# Patient Record
Sex: Female | Born: 1970 | Race: Black or African American | Hispanic: No | Marital: Single | Smoking: Never smoker
Health system: Southern US, Community
[De-identification: ages and names within clinical notes are randomized; demographics above are authoritative.]

## PROBLEM LIST (undated history)

## (undated) DIAGNOSIS — G5600 Carpal tunnel syndrome, unspecified upper limb: Secondary | ICD-10-CM

## (undated) DIAGNOSIS — J45909 Unspecified asthma, uncomplicated: Secondary | ICD-10-CM

## (undated) DIAGNOSIS — G2581 Restless legs syndrome: Secondary | ICD-10-CM

## (undated) DIAGNOSIS — B029 Zoster without complications: Secondary | ICD-10-CM

## (undated) DIAGNOSIS — I10 Essential (primary) hypertension: Secondary | ICD-10-CM

## (undated) DIAGNOSIS — G609 Hereditary and idiopathic neuropathy, unspecified: Secondary | ICD-10-CM

## (undated) DIAGNOSIS — N63 Unspecified lump in unspecified breast: Secondary | ICD-10-CM

## (undated) DIAGNOSIS — Z21 Asymptomatic human immunodeficiency virus [HIV] infection status: Secondary | ICD-10-CM

## (undated) DIAGNOSIS — F411 Generalized anxiety disorder: Secondary | ICD-10-CM

## (undated) DIAGNOSIS — B20 Human immunodeficiency virus [HIV] disease: Secondary | ICD-10-CM

## (undated) HISTORY — PX: STAPEDES SURGERY: SHX789

## (undated) HISTORY — DX: Essential (primary) hypertension: I10

## (undated) HISTORY — DX: Generalized anxiety disorder: F41.1

## (undated) HISTORY — DX: Carpal tunnel syndrome, unspecified upper limb: G56.00

## (undated) HISTORY — DX: Restless legs syndrome: G25.81

## (undated) HISTORY — DX: Zoster without complications: B02.9

## (undated) HISTORY — DX: Hereditary and idiopathic neuropathy, unspecified: G60.9

## (undated) HISTORY — DX: Unspecified lump in unspecified breast: N63.0

## (undated) HISTORY — DX: Unspecified asthma, uncomplicated: J45.909

---

## 1998-06-19 ENCOUNTER — Encounter: Admission: RE | Admit: 1998-06-19 | Discharge: 1998-06-19 | Payer: Self-pay | Admitting: Sports Medicine

## 2001-10-06 ENCOUNTER — Other Ambulatory Visit: Admission: RE | Admit: 2001-10-06 | Discharge: 2001-10-06 | Payer: Self-pay | Admitting: Family Medicine

## 2004-04-18 ENCOUNTER — Emergency Department (HOSPITAL_COMMUNITY): Admission: EM | Admit: 2004-04-18 | Discharge: 2004-04-18 | Payer: Self-pay | Admitting: Emergency Medicine

## 2005-05-10 ENCOUNTER — Emergency Department (HOSPITAL_COMMUNITY): Admission: EM | Admit: 2005-05-10 | Discharge: 2005-05-10 | Payer: Self-pay | Admitting: Emergency Medicine

## 2005-08-01 ENCOUNTER — Encounter: Admission: RE | Admit: 2005-08-01 | Discharge: 2005-10-30 | Payer: Self-pay | Admitting: Specialist

## 2005-09-27 ENCOUNTER — Emergency Department (HOSPITAL_COMMUNITY): Admission: EM | Admit: 2005-09-27 | Discharge: 2005-09-27 | Payer: Self-pay | Admitting: Emergency Medicine

## 2005-11-09 ENCOUNTER — Emergency Department (HOSPITAL_COMMUNITY): Admission: EM | Admit: 2005-11-09 | Discharge: 2005-11-09 | Payer: Self-pay | Admitting: Emergency Medicine

## 2006-01-19 ENCOUNTER — Emergency Department (HOSPITAL_COMMUNITY): Admission: EM | Admit: 2006-01-19 | Discharge: 2006-01-20 | Payer: Self-pay | Admitting: Emergency Medicine

## 2006-01-19 DIAGNOSIS — B20 Human immunodeficiency virus [HIV] disease: Secondary | ICD-10-CM | POA: Insufficient documentation

## 2006-01-28 ENCOUNTER — Encounter (INDEPENDENT_AMBULATORY_CARE_PROVIDER_SITE_OTHER): Payer: Self-pay | Admitting: *Deleted

## 2006-01-28 ENCOUNTER — Ambulatory Visit: Payer: Self-pay | Admitting: Internal Medicine

## 2006-01-28 ENCOUNTER — Encounter: Admission: RE | Admit: 2006-01-28 | Discharge: 2006-01-28 | Payer: Self-pay | Admitting: Internal Medicine

## 2006-01-28 LAB — CONVERTED CEMR LAB
CD4 Count: 530 microliters
CD4 T Cell Abs: 530
HIV 1 RNA Quant: 8690 copies/mL

## 2006-02-11 ENCOUNTER — Ambulatory Visit: Payer: Self-pay | Admitting: Internal Medicine

## 2006-05-05 ENCOUNTER — Encounter (INDEPENDENT_AMBULATORY_CARE_PROVIDER_SITE_OTHER): Payer: Self-pay | Admitting: *Deleted

## 2006-05-05 ENCOUNTER — Ambulatory Visit: Payer: Self-pay | Admitting: Internal Medicine

## 2006-05-05 ENCOUNTER — Encounter: Admission: RE | Admit: 2006-05-05 | Discharge: 2006-05-05 | Payer: Self-pay | Admitting: Internal Medicine

## 2006-05-05 LAB — CONVERTED CEMR LAB
CD4 Count: 310 microliters
HIV 1 RNA Quant: 2130 copies/mL

## 2006-05-20 ENCOUNTER — Ambulatory Visit: Payer: Self-pay | Admitting: Internal Medicine

## 2006-07-08 ENCOUNTER — Encounter: Admission: RE | Admit: 2006-07-08 | Discharge: 2006-07-08 | Payer: Self-pay | Admitting: Internal Medicine

## 2006-07-08 ENCOUNTER — Encounter: Payer: Self-pay | Admitting: Internal Medicine

## 2006-07-08 ENCOUNTER — Ambulatory Visit: Payer: Self-pay | Admitting: Infectious Diseases

## 2006-07-08 ENCOUNTER — Encounter (INDEPENDENT_AMBULATORY_CARE_PROVIDER_SITE_OTHER): Payer: Self-pay | Admitting: *Deleted

## 2006-07-08 LAB — CONVERTED CEMR LAB
ALT: 14 units/L (ref 0–35)
AST: 18 units/L (ref 0–37)
Albumin: 3.8 g/dL (ref 3.5–5.2)
Alkaline Phosphatase: 57 units/L (ref 39–117)
BUN: 14 mg/dL (ref 6–23)
Basophils Absolute: 0 10*3/uL (ref 0.0–0.1)
Basophils Relative: 0 % (ref 0–1)
CD4 Count: 430 microliters
CO2: 23 meq/L (ref 19–32)
Calcium: 10.1 mg/dL (ref 8.4–10.5)
Chloride: 105 meq/L (ref 96–112)
Creatinine, Ser: 0.84 mg/dL (ref 0.40–1.20)
Eosinophils Absolute: 0.1 10*3/uL (ref 0.0–0.7)
Eosinophils Relative: 2 % (ref 0–5)
Glucose, Bld: 100 mg/dL — ABNORMAL HIGH (ref 70–99)
HCT: 36.7 % (ref 36.0–46.0)
HIV 1 RNA Quant: 2990 copies/mL
HIV 1 RNA Quant: 2990 copies/mL — ABNORMAL HIGH (ref <50)
HIV-1 RNA Quant, Log: 3.48 — ABNORMAL HIGH (ref <1.70)
Hemoglobin: 12.1 g/dL (ref 12.0–15.0)
Lymphocytes Relative: 47 % — ABNORMAL HIGH (ref 12–46)
Lymphs Abs: 1.9 10*3/uL (ref 0.7–3.3)
MCHC: 33 g/dL (ref 30.0–36.0)
MCV: 80 fL (ref 78.0–100.0)
Monocytes Absolute: 0.4 10*3/uL (ref 0.2–0.7)
Monocytes Relative: 10 % (ref 3–11)
Neutro Abs: 1.6 10*3/uL — ABNORMAL LOW (ref 1.7–7.7)
Neutrophils Relative %: 41 % — ABNORMAL LOW (ref 43–77)
Platelets: 209 10*3/uL (ref 150–400)
Potassium: 3.7 meq/L (ref 3.5–5.3)
RBC: 4.59 M/uL (ref 3.87–5.11)
RDW: 16.5 % — ABNORMAL HIGH (ref 11.5–14.0)
Sodium: 139 meq/L (ref 135–145)
Total Bilirubin: 0.3 mg/dL (ref 0.3–1.2)
Total Protein: 8.3 g/dL (ref 6.0–8.3)
WBC: 3.9 10*3/uL — ABNORMAL LOW (ref 4.0–10.5)

## 2006-07-22 ENCOUNTER — Ambulatory Visit: Payer: Self-pay | Admitting: Infectious Diseases

## 2006-07-22 DIAGNOSIS — J309 Allergic rhinitis, unspecified: Secondary | ICD-10-CM | POA: Insufficient documentation

## 2006-07-22 DIAGNOSIS — G5600 Carpal tunnel syndrome, unspecified upper limb: Secondary | ICD-10-CM | POA: Insufficient documentation

## 2006-07-22 DIAGNOSIS — J45909 Unspecified asthma, uncomplicated: Secondary | ICD-10-CM

## 2006-07-22 DIAGNOSIS — F411 Generalized anxiety disorder: Secondary | ICD-10-CM

## 2006-07-22 DIAGNOSIS — I10 Essential (primary) hypertension: Secondary | ICD-10-CM | POA: Insufficient documentation

## 2006-07-22 HISTORY — DX: Generalized anxiety disorder: F41.1

## 2006-07-22 HISTORY — DX: Carpal tunnel syndrome, unspecified upper limb: G56.00

## 2006-07-22 HISTORY — DX: Essential (primary) hypertension: I10

## 2006-07-22 HISTORY — DX: Unspecified asthma, uncomplicated: J45.909

## 2006-08-20 ENCOUNTER — Ambulatory Visit: Payer: Self-pay | Admitting: Infectious Diseases

## 2006-08-24 ENCOUNTER — Encounter (INDEPENDENT_AMBULATORY_CARE_PROVIDER_SITE_OTHER): Payer: Self-pay | Admitting: *Deleted

## 2006-08-24 LAB — CONVERTED CEMR LAB

## 2006-09-06 ENCOUNTER — Encounter (INDEPENDENT_AMBULATORY_CARE_PROVIDER_SITE_OTHER): Payer: Self-pay | Admitting: *Deleted

## 2006-09-15 ENCOUNTER — Encounter (INDEPENDENT_AMBULATORY_CARE_PROVIDER_SITE_OTHER): Payer: Self-pay | Admitting: *Deleted

## 2006-10-21 ENCOUNTER — Encounter: Admission: RE | Admit: 2006-10-21 | Discharge: 2006-10-21 | Payer: Self-pay | Admitting: Infectious Diseases

## 2006-10-21 ENCOUNTER — Ambulatory Visit: Payer: Self-pay | Admitting: Infectious Diseases

## 2006-10-21 LAB — CONVERTED CEMR LAB
ALT: 15 units/L (ref 0–35)
AST: 16 units/L (ref 0–37)
Albumin: 3.8 g/dL (ref 3.5–5.2)
Alkaline Phosphatase: 52 units/L (ref 39–117)
BUN: 16 mg/dL (ref 6–23)
Bilirubin Urine: NEGATIVE
CD4 Count: 350 microliters
CO2: 23 meq/L (ref 19–32)
Calcium: 9.6 mg/dL (ref 8.4–10.5)
Chloride: 105 meq/L (ref 96–112)
Cholesterol: 166 mg/dL (ref 0–200)
Creatinine, Ser: 0.8 mg/dL (ref 0.40–1.20)
Glucose, Bld: 87 mg/dL (ref 70–99)
HCT: 39.1 % (ref 36.0–46.0)
HDL: 42 mg/dL (ref >39)
HIV 1 RNA Quant: 1860 copies/mL — ABNORMAL HIGH (ref <50)
HIV-1 RNA Quant, Log: 3.27 — ABNORMAL HIGH (ref <1.70)
Hemoglobin, Urine: NEGATIVE
Hemoglobin: 12.9 g/dL (ref 12.0–15.0)
Ketones, ur: NEGATIVE mg/dL
LDL Cholesterol: 104 mg/dL — ABNORMAL HIGH (ref 0–99)
Leukocytes, UA: NEGATIVE
MCHC: 33 g/dL (ref 30.0–36.0)
MCV: 83 fL (ref 78.0–100.0)
Nitrite: NEGATIVE
Platelets: 161 10*3/uL (ref 150–400)
Potassium: 3.8 meq/L (ref 3.5–5.3)
Protein, ur: NEGATIVE mg/dL
RBC: 4.71 M/uL (ref 3.87–5.11)
RDW: 15.1 % — ABNORMAL HIGH (ref 11.5–14.0)
Sodium: 138 meq/L (ref 135–145)
Specific Gravity, Urine: 1.019 (ref 1.005–1.03)
Total Bilirubin: 0.1 mg/dL — ABNORMAL LOW (ref 0.3–1.2)
Total CHOL/HDL Ratio: 4
Total Protein: 8.6 g/dL — ABNORMAL HIGH (ref 6.0–8.3)
Triglycerides: 101 mg/dL (ref <150)
Urine Glucose: NEGATIVE mg/dL
Urobilinogen, UA: 0.2 (ref 0.0–1.0)
VLDL: 20 mg/dL (ref 0–40)
WBC: 3.5 10*3/uL — ABNORMAL LOW (ref 4.0–10.5)
pH: 7 (ref 5.0–8.0)

## 2006-10-22 ENCOUNTER — Encounter: Payer: Self-pay | Admitting: Infectious Diseases

## 2007-04-02 ENCOUNTER — Ambulatory Visit: Payer: Self-pay | Admitting: Infectious Diseases

## 2007-04-02 ENCOUNTER — Encounter: Admission: RE | Admit: 2007-04-02 | Discharge: 2007-04-02 | Payer: Self-pay | Admitting: Infectious Diseases

## 2007-04-02 LAB — CONVERTED CEMR LAB
ALT: 13 units/L (ref 0–35)
AST: 16 units/L (ref 0–37)
Albumin: 3.6 g/dL (ref 3.5–5.2)
Alkaline Phosphatase: 50 units/L (ref 39–117)
BUN: 14 mg/dL (ref 6–23)
Basophils Absolute: 0 10*3/uL (ref 0.0–0.1)
Basophils Relative: 0 % (ref 0–1)
CO2: 22 meq/L (ref 19–32)
Calcium: 8.5 mg/dL (ref 8.4–10.5)
Chloride: 105 meq/L (ref 96–112)
Creatinine, Ser: 0.79 mg/dL (ref 0.40–1.20)
Eosinophils Absolute: 0.1 10*3/uL (ref 0.0–0.7)
Eosinophils Relative: 2 % (ref 0–5)
Glucose, Bld: 80 mg/dL (ref 70–99)
HCT: 36.8 % (ref 36.0–46.0)
HIV 1 RNA Quant: 1720 copies/mL — ABNORMAL HIGH (ref <50)
HIV-1 RNA Quant, Log: 3.24 — ABNORMAL HIGH (ref <1.70)
Hemoglobin: 12 g/dL (ref 12.0–15.0)
Lymphocytes Relative: 42 % (ref 12–46)
Lymphs Abs: 1.4 10*3/uL (ref 0.7–3.3)
MCHC: 32.6 g/dL (ref 30.0–36.0)
MCV: 83.3 fL (ref 78.0–100.0)
Monocytes Absolute: 0.4 10*3/uL (ref 0.2–0.7)
Monocytes Relative: 13 % — ABNORMAL HIGH (ref 3–11)
Neutro Abs: 1.4 10*3/uL — ABNORMAL LOW (ref 1.7–7.7)
Neutrophils Relative %: 43 % (ref 43–77)
Platelets: 182 10*3/uL (ref 150–400)
Potassium: 3.7 meq/L (ref 3.5–5.3)
RBC: 4.42 M/uL (ref 3.87–5.11)
RDW: 15.4 % — ABNORMAL HIGH (ref 11.5–14.0)
Sodium: 137 meq/L (ref 135–145)
Total Bilirubin: 0.4 mg/dL (ref 0.3–1.2)
Total Protein: 7.8 g/dL (ref 6.0–8.3)
WBC: 3.3 10*3/uL — ABNORMAL LOW (ref 4.0–10.5)

## 2007-05-03 ENCOUNTER — Encounter: Payer: Self-pay | Admitting: Licensed Clinical Social Worker

## 2007-05-03 ENCOUNTER — Ambulatory Visit: Payer: Self-pay | Admitting: Infectious Diseases

## 2007-05-03 DIAGNOSIS — E669 Obesity, unspecified: Secondary | ICD-10-CM

## 2007-06-29 ENCOUNTER — Encounter: Payer: Self-pay | Admitting: Infectious Diseases

## 2007-08-11 ENCOUNTER — Ambulatory Visit: Payer: Self-pay | Admitting: Infectious Diseases

## 2007-08-11 ENCOUNTER — Encounter: Admission: RE | Admit: 2007-08-11 | Discharge: 2007-08-11 | Payer: Self-pay | Admitting: Infectious Diseases

## 2007-08-11 LAB — CONVERTED CEMR LAB
ALT: 10 units/L (ref 0–35)
AST: 14 units/L (ref 0–37)
Albumin: 3.7 g/dL (ref 3.5–5.2)
Alkaline Phosphatase: 55 units/L (ref 39–117)
BUN: 16 mg/dL (ref 6–23)
Basophils Absolute: 0 10*3/uL (ref 0.0–0.1)
Basophils Relative: 0 % (ref 0–1)
Bilirubin Urine: NEGATIVE
CO2: 25 meq/L (ref 19–32)
Calcium: 9 mg/dL (ref 8.4–10.5)
Chloride: 103 meq/L (ref 96–112)
Creatinine, Ser: 0.91 mg/dL (ref 0.40–1.20)
Eosinophils Absolute: 0.1 10*3/uL (ref 0.0–0.7)
Eosinophils Relative: 3 % (ref 0–5)
Glucose, Bld: 94 mg/dL (ref 70–99)
HCT: 35.5 % — ABNORMAL LOW (ref 36.0–46.0)
HIV 1 RNA Quant: 2920 copies/mL — ABNORMAL HIGH (ref <50)
HIV-1 RNA Quant, Log: 3.47 — ABNORMAL HIGH (ref <1.70)
Hemoglobin, Urine: NEGATIVE
Hemoglobin: 11.7 g/dL — ABNORMAL LOW (ref 12.0–15.0)
Leukocytes, UA: NEGATIVE
Lymphocytes Relative: 36 % (ref 12–46)
Lymphs Abs: 1.5 10*3/uL (ref 0.7–4.0)
MCHC: 33 g/dL (ref 30.0–36.0)
MCV: 82.8 fL (ref 78.0–100.0)
Monocytes Absolute: 0.5 10*3/uL (ref 0.1–1.0)
Monocytes Relative: 13 % — ABNORMAL HIGH (ref 3–12)
Neutro Abs: 2 10*3/uL (ref 1.7–7.7)
Neutrophils Relative %: 48 % (ref 43–77)
Nitrite: NEGATIVE
Platelets: 167 10*3/uL (ref 150–400)
Potassium: 3.5 meq/L (ref 3.5–5.3)
Protein, ur: NEGATIVE mg/dL
RBC: 4.29 M/uL (ref 3.87–5.11)
RDW: 14.5 % (ref 11.5–15.5)
Sodium: 138 meq/L (ref 135–145)
Specific Gravity, Urine: 1.033 — ABNORMAL HIGH (ref 1.005–1.03)
Total Bilirubin: 0.3 mg/dL (ref 0.3–1.2)
Total Protein: 7.9 g/dL (ref 6.0–8.3)
Urine Glucose: NEGATIVE mg/dL
Urobilinogen, UA: 0.2 (ref 0.0–1.0)
WBC: 4.1 10*3/uL (ref 4.0–10.5)
pH: 6 (ref 5.0–8.0)

## 2007-08-24 ENCOUNTER — Encounter (INDEPENDENT_AMBULATORY_CARE_PROVIDER_SITE_OTHER): Payer: Self-pay | Admitting: *Deleted

## 2007-08-25 ENCOUNTER — Ambulatory Visit: Payer: Self-pay | Admitting: Infectious Diseases

## 2007-11-16 ENCOUNTER — Encounter: Payer: Self-pay | Admitting: Infectious Diseases

## 2008-02-01 ENCOUNTER — Ambulatory Visit: Payer: Self-pay | Admitting: Infectious Diseases

## 2008-02-01 ENCOUNTER — Encounter: Admission: RE | Admit: 2008-02-01 | Discharge: 2008-02-01 | Payer: Self-pay | Admitting: Infectious Diseases

## 2008-02-01 LAB — CONVERTED CEMR LAB
ALT: 13 units/L (ref 0–35)
AST: 16 units/L (ref 0–37)
Albumin: 3.8 g/dL (ref 3.5–5.2)
Alkaline Phosphatase: 45 units/L (ref 39–117)
BUN: 14 mg/dL (ref 6–23)
Basophils Absolute: 0 10*3/uL (ref 0.0–0.1)
Basophils Relative: 0 % (ref 0–1)
CO2: 24 meq/L (ref 19–32)
Calcium: 9 mg/dL (ref 8.4–10.5)
Chloride: 103 meq/L (ref 96–112)
Creatinine, Ser: 0.86 mg/dL (ref 0.40–1.20)
Eosinophils Absolute: 0.1 10*3/uL (ref 0.0–0.7)
Eosinophils Relative: 2 % (ref 0–5)
Glucose, Bld: 88 mg/dL (ref 70–99)
HCT: 35.2 % — ABNORMAL LOW (ref 36.0–46.0)
HIV 1 RNA Quant: 1950 copies/mL — ABNORMAL HIGH (ref <50)
HIV-1 RNA Quant, Log: 3.29 — ABNORMAL HIGH (ref <1.70)
Hemoglobin: 11.3 g/dL — ABNORMAL LOW (ref 12.0–15.0)
Lymphocytes Relative: 44 % (ref 12–46)
Lymphs Abs: 1.6 10*3/uL (ref 0.7–4.0)
MCHC: 32.1 g/dL (ref 30.0–36.0)
MCV: 81.5 fL (ref 78.0–100.0)
Monocytes Absolute: 0.5 10*3/uL (ref 0.1–1.0)
Monocytes Relative: 14 % — ABNORMAL HIGH (ref 3–12)
Neutro Abs: 1.5 10*3/uL — ABNORMAL LOW (ref 1.7–7.7)
Neutrophils Relative %: 40 % — ABNORMAL LOW (ref 43–77)
Platelets: 202 10*3/uL (ref 150–400)
Potassium: 3.7 meq/L (ref 3.5–5.3)
RBC: 4.32 M/uL (ref 3.87–5.11)
RDW: 15.7 % — ABNORMAL HIGH (ref 11.5–15.5)
Sodium: 138 meq/L (ref 135–145)
Total Bilirubin: 0.3 mg/dL (ref 0.3–1.2)
Total Protein: 8.1 g/dL (ref 6.0–8.3)
WBC: 3.7 10*3/uL — ABNORMAL LOW (ref 4.0–10.5)

## 2008-02-14 ENCOUNTER — Ambulatory Visit: Payer: Self-pay | Admitting: Infectious Diseases

## 2008-06-14 ENCOUNTER — Ambulatory Visit: Payer: Self-pay | Admitting: Infectious Diseases

## 2008-06-14 LAB — CONVERTED CEMR LAB
ALT: 13 units/L (ref 0–35)
AST: 17 units/L (ref 0–37)
Albumin: 3.6 g/dL (ref 3.5–5.2)
Alkaline Phosphatase: 48 units/L (ref 39–117)
BUN: 14 mg/dL (ref 6–23)
Basophils Absolute: 0 10*3/uL (ref 0.0–0.1)
Basophils Relative: 0 % (ref 0–1)
CO2: 22 meq/L (ref 19–32)
Calcium: 8.6 mg/dL (ref 8.4–10.5)
Chloride: 105 meq/L (ref 96–112)
Creatinine, Ser: 0.7 mg/dL (ref 0.40–1.20)
Eosinophils Absolute: 0.1 10*3/uL (ref 0.0–0.7)
Eosinophils Relative: 3 % (ref 0–5)
Glucose, Bld: 119 mg/dL — ABNORMAL HIGH (ref 70–99)
HCT: 36.5 % (ref 36.0–46.0)
HIV 1 RNA Quant: 3040 copies/mL — ABNORMAL HIGH (ref <48)
HIV-1 RNA Quant, Log: 3.48 — ABNORMAL HIGH (ref <1.68)
Hemoglobin: 11.4 g/dL — ABNORMAL LOW (ref 12.0–15.0)
Lymphocytes Relative: 45 % (ref 12–46)
Lymphs Abs: 1.4 10*3/uL (ref 0.7–4.0)
MCHC: 31.2 g/dL (ref 30.0–36.0)
MCV: 82.6 fL (ref 78.0–100.0)
Monocytes Absolute: 0.4 10*3/uL (ref 0.1–1.0)
Monocytes Relative: 14 % — ABNORMAL HIGH (ref 3–12)
Neutro Abs: 1.2 10*3/uL — ABNORMAL LOW (ref 1.7–7.7)
Neutrophils Relative %: 38 % — ABNORMAL LOW (ref 43–77)
Platelets: 202 10*3/uL (ref 150–400)
Potassium: 3.4 meq/L — ABNORMAL LOW (ref 3.5–5.3)
RBC: 4.42 M/uL (ref 3.87–5.11)
RDW: 15.3 % (ref 11.5–15.5)
Sodium: 139 meq/L (ref 135–145)
Total Bilirubin: 0.3 mg/dL (ref 0.3–1.2)
Total Protein: 7.9 g/dL (ref 6.0–8.3)
WBC: 3.1 10*3/uL — ABNORMAL LOW (ref 4.0–10.5)

## 2008-07-03 ENCOUNTER — Ambulatory Visit: Payer: Self-pay | Admitting: Infectious Diseases

## 2008-07-03 ENCOUNTER — Encounter (INDEPENDENT_AMBULATORY_CARE_PROVIDER_SITE_OTHER): Payer: Self-pay | Admitting: *Deleted

## 2008-07-03 DIAGNOSIS — G609 Hereditary and idiopathic neuropathy, unspecified: Secondary | ICD-10-CM

## 2008-07-03 HISTORY — DX: Hereditary and idiopathic neuropathy, unspecified: G60.9

## 2008-11-01 ENCOUNTER — Ambulatory Visit: Payer: Self-pay | Admitting: Infectious Diseases

## 2008-11-01 LAB — CONVERTED CEMR LAB
ALT: 16 units/L (ref 0–35)
AST: 19 units/L (ref 0–37)
Albumin: 3.8 g/dL (ref 3.5–5.2)
Alkaline Phosphatase: 55 units/L (ref 39–117)
BUN: 12 mg/dL (ref 6–23)
CO2: 24 meq/L (ref 19–32)
Calcium: 8.9 mg/dL (ref 8.4–10.5)
Chloride: 103 meq/L (ref 96–112)
Cholesterol: 130 mg/dL (ref 0–200)
Creatinine, Ser: 0.72 mg/dL (ref 0.40–1.20)
GFR calc Af Amer: >60 mL/min (ref >60)
GFR calc non Af Amer: >60 mL/min (ref >60)
Glucose, Bld: 82 mg/dL (ref 70–99)
HCT: 35.8 % — ABNORMAL LOW (ref 36.0–46.0)
HDL: 37 mg/dL — ABNORMAL LOW (ref >39)
HIV 1 RNA Quant: 3270 copies/mL — ABNORMAL HIGH (ref <48)
HIV-1 RNA Quant, Log: 3.51 — ABNORMAL HIGH (ref <1.68)
Hemoglobin: 11.5 g/dL — ABNORMAL LOW (ref 12.0–15.0)
LDL Cholesterol: 58 mg/dL (ref 0–99)
MCHC: 32.1 g/dL (ref 30.0–36.0)
MCV: 78.9 fL (ref 78.0–100.0)
Platelets: 211 10*3/uL (ref 150–400)
Potassium: 3.5 meq/L (ref 3.5–5.3)
RBC: 4.54 M/uL (ref 3.87–5.11)
RDW: 15.8 % — ABNORMAL HIGH (ref 11.5–15.5)
Sodium: 136 meq/L (ref 135–145)
Total Bilirubin: 0.4 mg/dL (ref 0.3–1.2)
Total CHOL/HDL Ratio: 3.5
Total Protein: 8.3 g/dL (ref 6.0–8.3)
Triglycerides: 177 mg/dL — ABNORMAL HIGH (ref <150)
VLDL: 35 mg/dL (ref 0–40)
WBC: 3.4 10*3/uL — ABNORMAL LOW (ref 4.0–10.5)

## 2009-06-20 ENCOUNTER — Ambulatory Visit: Payer: Self-pay | Admitting: Infectious Diseases

## 2009-06-20 LAB — CONVERTED CEMR LAB
ALT: 18 units/L (ref 0–35)
AST: 18 units/L (ref 0–37)
Albumin: 3.5 g/dL (ref 3.5–5.2)
Alkaline Phosphatase: 50 units/L (ref 39–117)
BUN: 13 mg/dL (ref 6–23)
Basophils Absolute: 0 10*3/uL (ref 0.0–0.1)
Basophils Relative: 0 % (ref 0–1)
CO2: 21 meq/L (ref 19–32)
Calcium: 8.8 mg/dL (ref 8.4–10.5)
Chloride: 103 meq/L (ref 96–112)
Creatinine, Ser: 0.66 mg/dL (ref 0.40–1.20)
Eosinophils Absolute: 0.1 10*3/uL (ref 0.0–0.7)
Eosinophils Relative: 2 % (ref 0–5)
Glucose, Bld: 99 mg/dL (ref 70–99)
HCT: 33.5 % — ABNORMAL LOW (ref 36.0–46.0)
HIV 1 RNA Quant: 3270 copies/mL — ABNORMAL HIGH (ref <48)
HIV-1 RNA Quant, Log: 3.51 — ABNORMAL HIGH (ref <1.68)
Hemoglobin: 11 g/dL — ABNORMAL LOW (ref 12.0–15.0)
Lymphocytes Relative: 43 % (ref 12–46)
Lymphs Abs: 1.5 10*3/uL (ref 0.7–4.0)
MCHC: 32.8 g/dL (ref 30.0–36.0)
MCV: 80.7 fL (ref >=78.0)
Monocytes Absolute: 0.5 10*3/uL (ref 0.1–1.0)
Monocytes Relative: 13 % — ABNORMAL HIGH (ref 3–12)
Neutro Abs: 1.5 10*3/uL — ABNORMAL LOW (ref 1.7–7.7)
Neutrophils Relative %: 42 % — ABNORMAL LOW (ref 43–77)
Platelets: 213 10*3/uL (ref 150–400)
Potassium: 3.7 meq/L (ref 3.5–5.3)
RBC: 4.15 M/uL (ref 3.87–5.11)
RDW: 15.1 % (ref 11.5–15.5)
Sodium: 137 meq/L (ref 135–145)
Total Bilirubin: 0.3 mg/dL (ref 0.3–1.2)
Total Protein: 7.7 g/dL (ref 6.0–8.3)
WBC: 3.6 10*3/uL — ABNORMAL LOW (ref 4.0–10.5)

## 2009-09-28 ENCOUNTER — Emergency Department (HOSPITAL_COMMUNITY): Admission: EM | Admit: 2009-09-28 | Discharge: 2009-09-29 | Payer: Self-pay | Admitting: Emergency Medicine

## 2009-10-30 ENCOUNTER — Encounter (INDEPENDENT_AMBULATORY_CARE_PROVIDER_SITE_OTHER): Payer: Self-pay | Admitting: *Deleted

## 2009-10-30 ENCOUNTER — Ambulatory Visit: Payer: Self-pay | Admitting: Infectious Diseases

## 2009-10-30 LAB — CONVERTED CEMR LAB
ALT: 12 units/L (ref 0–35)
AST: 17 units/L (ref 0–37)
Albumin: 3.6 g/dL (ref 3.5–5.2)
Alkaline Phosphatase: 52 units/L (ref 39–117)
BUN: 9 mg/dL (ref 6–23)
Basophils Absolute: 0 10*3/uL (ref 0.0–0.1)
Basophils Relative: 0 % (ref 0–1)
CO2: 23 meq/L (ref 19–32)
Calcium: 8.5 mg/dL (ref 8.4–10.5)
Chloride: 103 meq/L (ref 96–112)
Cholesterol: 138 mg/dL (ref 0–200)
Creatinine, Ser: 0.7 mg/dL (ref 0.40–1.20)
Eosinophils Absolute: 0 10*3/uL (ref 0.0–0.7)
Eosinophils Relative: 2 % (ref 0–5)
Glucose, Bld: 98 mg/dL (ref 70–99)
HCT: 34.4 % — ABNORMAL LOW (ref 36.0–46.0)
HDL: 36 mg/dL — ABNORMAL LOW (ref >39)
HIV 1 RNA Quant: 3440 copies/mL — ABNORMAL HIGH (ref <48)
HIV-1 RNA Quant, Log: 3.54 — ABNORMAL HIGH (ref <1.68)
Hemoglobin: 10.7 g/dL — ABNORMAL LOW (ref 12.0–15.0)
LDL Cholesterol: 83 mg/dL (ref 0–99)
Lymphocytes Relative: 48 % — ABNORMAL HIGH (ref 12–46)
Lymphs Abs: 1.3 10*3/uL (ref 0.7–4.0)
MCHC: 31.1 g/dL (ref 30.0–36.0)
MCV: 81.3 fL (ref 78.0–100.0)
Monocytes Absolute: 0.4 10*3/uL (ref 0.1–1.0)
Monocytes Relative: 15 % — ABNORMAL HIGH (ref 3–12)
Neutro Abs: 1 10*3/uL — ABNORMAL LOW (ref 1.7–7.7)
Neutrophils Relative %: 35 % — ABNORMAL LOW (ref 43–77)
Platelets: 191 10*3/uL (ref 150–400)
Potassium: 3.4 meq/L — ABNORMAL LOW (ref 3.5–5.3)
RBC: 4.23 M/uL (ref 3.87–5.11)
RDW: 15.9 % — ABNORMAL HIGH (ref 11.5–15.5)
Sodium: 138 meq/L (ref 135–145)
Total Bilirubin: 0.5 mg/dL (ref 0.3–1.2)
Total CHOL/HDL Ratio: 3.8
Total Protein: 7.4 g/dL (ref 6.0–8.3)
Triglycerides: 94 mg/dL (ref <150)
VLDL: 19 mg/dL (ref 0–40)
WBC: 2.7 10*3/uL — ABNORMAL LOW (ref 4.0–10.5)

## 2009-11-12 ENCOUNTER — Ambulatory Visit: Payer: Self-pay | Admitting: Infectious Diseases

## 2010-05-13 ENCOUNTER — Telehealth: Payer: Self-pay

## 2010-05-14 ENCOUNTER — Ambulatory Visit: Payer: Self-pay | Admitting: Adult Health

## 2010-05-21 ENCOUNTER — Ambulatory Visit: Payer: Self-pay | Admitting: Infectious Diseases

## 2010-05-21 ENCOUNTER — Encounter: Admission: RE | Admit: 2010-05-21 | Discharge: 2010-05-21 | Payer: Self-pay | Admitting: Internal Medicine

## 2010-05-21 ENCOUNTER — Telehealth (INDEPENDENT_AMBULATORY_CARE_PROVIDER_SITE_OTHER): Payer: Self-pay | Admitting: *Deleted

## 2010-05-21 DIAGNOSIS — B0229 Other postherpetic nervous system involvement: Secondary | ICD-10-CM

## 2010-05-21 LAB — CONVERTED CEMR LAB
HIV 1 RNA Quant: 28700 copies/mL — ABNORMAL HIGH (ref <20)
HIV-1 RNA Quant, Log: 4.46 — ABNORMAL HIGH (ref <1.30)

## 2010-05-28 DIAGNOSIS — B029 Zoster without complications: Secondary | ICD-10-CM

## 2010-05-28 HISTORY — DX: Zoster without complications: B02.9

## 2010-05-29 ENCOUNTER — Telehealth: Payer: Self-pay | Admitting: Infectious Diseases

## 2010-06-05 ENCOUNTER — Ambulatory Visit: Payer: Self-pay | Admitting: Infectious Diseases

## 2010-07-09 ENCOUNTER — Ambulatory Visit: Admit: 2010-07-09 | Payer: Self-pay | Admitting: Infectious Diseases

## 2010-07-30 NOTE — Progress Notes (Signed)
Summary: Lyrica PAP program documents completed and mailed  Phone Note Outgoing Call   Call placed by: Jennet Maduro RN,  May 21, 2010 5:06 PM Call placed to: [employer] to [facility]: PAP program for Lyrica.  Paperwork completed and mailed with original rx.  Jennet Maduro RN  May 21, 2010 5:07 PM

## 2010-07-30 NOTE — Miscellaneous (Signed)
Summary: Orders Update  Clinical Lists Changes  Orders: Added new Test order of T-CBC w/Diff [phone]) - Signed Added new Test order of T-Comprehensive Metabolic Panel [id number]) - Signed Added new Test order of T-HIV Viral Load [id number]) - Signed Added new Test order of T-Lipid Profile ([id number]) - Signed Added new Test order of T-CD4SP [facility]) (CD4SP) - Signed

## 2010-07-30 NOTE — Assessment & Plan Note (Signed)
Summary: upper chest wall pain   CC:  Intermittent pain upper left chest "a catch" in this area, started 05/18/10 during the day, and Pain has gottne worse since that time.  Has been taking the pain pills.  History of Present Illness: 40 yo F with HIV+ not on art, HTN and anxiety. Last CD4 270, VL 3440 (10-30-09). See nrecently by NP for chest pain. was dx with shingles and was started on tramadol. Now with continued pain in her L upper chest. worse with deep breath. can't cough. had CXR this AM (nl). had some relief with tramadol but makes her sleep.   Preventive Screening-Counseling & Management  Alcohol-Tobacco     Alcohol drinks/day: 0     Smoking Status: never  Caffeine-Diet-Exercise     Caffeine use/day: tea 6 per day     Does Patient Exercise: no  Hep-HIV-STD-Contraception     HIV Risk: no risk noted     HIV Risk Counseling: not indicated-no HIV risk noted  Safety-Violence-Falls     Seat Belt Use: 100   Updated Prior Medication List: MAXZIDE-25 37.5-25 MG TABS (TRIAMTERENE-HCTZ) one by mouth once daily ALBUTEROL 90 MCG/ACT AERS (ALBUTEROL) 1-2 puffs every 4 hours as needed SINGULAIR 10 MG TABS (MONTELUKAST SODIUM) one by mouth once daily MULTIVITAMINS  CAPS (MULTIPLE VITAMIN) one by mouth daily ALPRAZOLAM 0.5 MG  TABS (ALPRAZOLAM) prn LYRICA 50 MG CAPS (PREGABALIN) Take 1 tablet by mouth three times a day ZYRTEC HIVES RELIEF 10 MG TABS (CETIRIZINE HCL) one daily TRAMADOL HCL 50 MG TABS (TRAMADOL HCL) 1-2 tabs every 6 hours as needed for pain as directed  Current Allergies: ! * OXYCODONE Past History:  Past medical, surgical, family and social histories (including risk factors) reviewed, and no changes noted (except as noted below).  Past Medical History: Reviewed history from 07/03/2008 and no changes required. Allergic rhinitis Anxiety Asthma HIV disease Hypertension Peripheral neuropathy PMH-FH-SH reviewed-no changes except otherwise noted  Family  History: Reviewed history from 07/03/2008 and no changes required. father deceased from pancreatic Ca grandmother with throat cancer.  Family History Hypertension- mother  Social History: Reviewed history from 07/22/2006 and no changes required. Never Smoked Alcohol use-no Drug use-no Regular exercise-yes  Review of Systems       states she keeps having a sinus infxn and that her ears have not been clear in 3 months.   Vital Signs:  Patient profile:   40 year old female Height:      68 inches (172.72 cm) Weight:      287.5 pounds (130.68 kg) BMI:     43.87 Temp:     98.7 degrees F (37.06 degrees C) oral Pulse rate:   93 / minute BP sitting:   171 / 124  (left arm) Cuff size:   regular  Vitals Entered By: Jennet Maduro RN (May 21, 2010 3:45 PM) CC: Intermittent pain upper left chest "a catch" in this area, started 05/18/10 during the day,  Pain has gottne worse since that time.  Has been taking the pain pills Is Patient Diabetic? No Pain Assessment Patient in pain? yes     Location: left upper chest wall Intensity: 10 Type: sharp Onset of pain  Intermittent Nutritional Status BMI of > 30 = obese  Have you ever been in a relationship where you felt threatened, hurt or afraid?not asked today   Does patient need assistance? Functional Status Self care Ambulation Impaired:Risk for fall Comments due to pain   Physical Exam  General:  well-developed, well-nourished, well-hydrated, and overweight-appearing.   Eyes:  pupils equal, pupils round, and pupils reactive to light.   Mouth:  pharynx pink and moist and no exudates.   Neck:  no masses.   Chest Wall:  no lesions on ant chest. she has few small lesions on posterior back. no active blisters.  Lungs:  normal respiratory effort and normal breath sounds.   Heart:  normal rate, regular rhythm, and no murmur.   Abdomen:  soft, non-tender, and normal bowel sounds.             Prevention For Positives:  05/21/2010   Safe sex practices discussed with patient. Condoms offered.                             Impression & Recommendations:  Problem # 1:  POSTHERPETIC NEURALGIA (ICD-053.19) she will cont on her tramadol and take her lyrica as often as written without it interfering with her job function.   Problem # 2:  HIV DISEASE (ICD-042) still thinking about taking ART. doesn't want to be sick. will re-eval in Dec. considering complera. offered condoms.   Other Orders: Est. Patient Level IV (04540)  Prescriptions: TRAMADOL HCL 50 MG TABS (TRAMADOL HCL) 1-2 tabs every 6 hours as needed for pain as directed  #70 x 0   Entered and Authorized by:   Johny Sax MD   Signed by:   Johny Sax MD on 05/21/2010   Method used:   Print then Give to Patient   RxID:   [id number] LYRICA 50 MG CAPS (PREGABALIN) Take 1 tablet by mouth three times a day  #90 x 3   Entered and Authorized by:   Johny Sax MD   Signed by:   Johny Sax MD on 05/21/2010   Method used:   Print then Give to Patient   RxID:   [id number]

## 2010-07-30 NOTE — Progress Notes (Signed)
Summary: continued pain  Phone Note Call from Patient   Summary of Call: Pt  calling c/o continued shingles pain. She was seen at urgent care about 4 days ago and symptoms seem to be worsening.  She is having a lot of pain/ Ov suggested for eval. Appt with Herold Harms, NP on tomorrow. Tomasita Morrow RN  May 13, 2010 5:23 PM

## 2010-07-30 NOTE — Miscellaneous (Signed)
Summary: clinical update/ryan white  Clinical Lists Changes  Observations: Added new observation of AIDSDAP: No (10/30/2009 11:26) Added new observation of PCTFPL: 87.15  (10/30/2009 11:26) Added new observation of FAMILYSIZE: 4  (10/30/2009 11:26) Added new observation of HOUSEINCOME: 16109  (10/30/2009 11:26) Added new observation of FINASSESSDT: 10/30/2009  (10/30/2009 11:26) Added new observation of RW VITAL STA: Active  (10/30/2009 11:26)

## 2010-07-30 NOTE — Assessment & Plan Note (Signed)
Summary: CONTINUE SHINGLES PAIN/VS   Primary Provider:  Johny Sax MD  CC:  pt. c/o shingles outbreak on back and across chest, seen by primary care Dr. and DX shingles, but thinks it may be an allergic reaction to body spsray, and B/P very elevated.  History of Present Illness: New onset "rash" over back radiating to left side chest.  Was seen by PCP last Thurs., and was told it was "shingles" and given Rx for famciclovir.  Since then, rash unimproved with increasing pain and stabbing sensation to areas where "blisters" are located.  Is concerned it is associated with an allergic reaction to "body spray".  Denies fever or chills but recently been treated for sinusitis and completing Z-pak therapy.  Preventive Screening-Counseling & Management  Alcohol-Tobacco     Alcohol drinks/day: 0     Smoking Status: never  Caffeine-Diet-Exercise     Caffeine use/day: tea 6 per day     Does Patient Exercise: no  Safety-Violence-Falls     Seat Belt Use: 100      Drug Use:  no.     Current Allergies (reviewed today): ! * OXYCODONE Review of Systems General:  Complains of fatigue, malaise, and weakness; denies chills, fever, loss of appetite, sleep disorder, sweats, and weight loss. ENT:  Complains of hoarseness, nasal congestion, and sinus pressure. CV:  Denies bluish discoloration of lips or nails, chest pain or discomfort, difficulty breathing at night, difficulty breathing while lying down, fainting, fatigue, leg cramps with exertion, lightheadness, near fainting, palpitations, shortness of breath with exertion, swelling of feet, swelling of hands, and weight gain. Resp:  Denies cough and pleuritic. MS:  Diffuse arthargia and myalgia with i ncreased pain in back and left side. Derm:  See HPI.  Vital Signs:  Patient profile:   40 year old female Height:      68 inches (172.72 cm) Weight:      283.8 pounds (129 kg) BMI:     43.31 Temp:     97.5 degrees F (36.39 degrees C) oral Pulse  rate:   101 / minute BP sitting:   175 / 114  (left arm)  Vitals Entered By: Wendall Mola CMA Duncan Dull) (May 14, 2010 11:07 AM) CC: pt. c/o shingles outbreak on back and across chest, seen by primary care Dr. and DX shingles, but thinks it may be an allergic reaction to body spsray, B/P very elevated Is Patient Diabetic? No Pain Assessment Patient in pain? yes     Location: back and chest Intensity: 10 Type: sharp Onset of pain  Intermittent Nutritional Status BMI of > 30 = obese Nutritional Status Detail appetite "up and down"  Have you ever been in a relationship where you felt threatened, hurt or afraid?No   Does patient need assistance? Functional Status Self care Ambulation Normal Comments no missed doses of meds per pt. in 2 weeks   Physical Exam  General:  mild distress.   Head:  Normocephalic and atraumatic without obvious abnormalities. No apparent alopecia or balding. Ears:  External ear exam shows no significant lesions or deformities.  Otoscopic examination reveals clear canals, tympanic membranes are intact bilaterally without bulging, retraction, inflammation or discharge. Hearing is grossly normal bilaterally. Mouth:  postnasal drip.   Neck:  No deformities, masses, or tenderness noted. Lungs:  Normal respiratory effort, chest expands symmetrically. Lungs are clear to auscultation, no crackles or wheezes. Skin:  zosteriform rash configured along the T3,T4 dermatomal pathways radiating posteriorly left along  path to left  anterior breast area.  No involvement on right noted.  Some patchy scabbing noted at mid back T3 & T4 regions, but no other places noted.   Medications Added to Medication List This Visit: 1)  Tramadol Hcl 50 Mg Tabs (Tramadol hcl) .Marland Kitchen.. 1-2 tabs every 6 hours as needed for pain as directed  Patient Instructions: 1)  Keep area clean and dry 2)  No hot showers or baths  3)  Should not return to work until all blisters crusted over 4)   If symptoms not improving by the end of week, please contact clinic. 5)  Recommend increasing fluid intake for hydration for the next few days. 6)  Take 400-600mg  of Ibuprofen (Advil, Motrin) every 4-6 hours as needed for relief of pain or comfort of fever. Prescriptions: TRAMADOL HCL 50 MG TABS (TRAMADOL HCL) 1-2 tabs every 6 hours as needed for pain as directed  #70 x 0   Entered and Authorized by:   Talmadge Chad NP   Signed by:   Talmadge Chad NP on 05/14/2010   Method used:   Print then Give to Patient   RxID:   [id number]

## 2010-07-30 NOTE — Assessment & Plan Note (Signed)
Summary: f/u [mkj]   CC:  follow-up visit and lab results.  History of Present Illness: 40 yo F with HIV+ not on art, HTN and anxiety. Last CD4 270, VL 3440 (10-30-09).  today feels like a "decent day". BP has been OK, not using salt. has been walking. had recent root canal.   Preventive Screening-Counseling & Management  Alcohol-Tobacco     Alcohol drinks/day: 0     Smoking Status: never  Caffeine-Diet-Exercise     Caffeine use/day: tea 6 per day     Does Patient Exercise: no  Safety-Violence-Falls     Seat Belt Use: 100      Drug Use:  no.     Updated Prior Medication List: MAXZIDE-25 37.5-25 MG TABS (TRIAMTERENE-HCTZ) one by mouth once daily ALBUTEROL 90 MCG/ACT AERS (ALBUTEROL) 1-2 puffs every 4 hours as needed SINGULAIR 10 MG TABS (MONTELUKAST SODIUM) one by mouth once daily MULTIVITAMINS  CAPS (MULTIPLE VITAMIN) one by mouth daily ALEVE 220 MG TABS (NAPROXEN SODIUM) one by mouth twice daily as needed MIDRIN 325-65-100 MG  CAPS (APAP-ISOMETHEPTENE-DICHLORAL) prn ALPRAZOLAM 0.5 MG  TABS (ALPRAZOLAM) prn LYRICA 50 MG CAPS (PREGABALIN) Take 1 tablet by mouth three times a day ZYRTEC HIVES RELIEF 10 MG TABS (CETIRIZINE HCL) one daily  Current Allergies (reviewed today): ! * OXYCODONE Current Medications (verified): 1)  Maxzide-25 37.5-25 Mg Tabs (Triamterene-Hctz) .... One By Mouth Once Daily 2)  Albuterol 90 Mcg/act Aers (Albuterol) .Marland Kitchen.. 1-2 Puffs Every 4 Hours As Needed 3)  Singulair 10 Mg Tabs (Montelukast Sodium) .... One By Mouth Once Daily 4)  Multivitamins  Caps (Multiple Vitamin) .... One By Mouth Daily 5)  Aleve 220 Mg Tabs (Naproxen Sodium) .... One By Mouth Twice Daily As Needed 6)  Midrin 325-65-100 Mg  Caps (Apap-Isometheptene-Dichloral) .... Prn 7)  Alprazolam 0.5 Mg  Tabs (Alprazolam) .... Prn 8)  Lyrica 50 Mg Caps (Pregabalin) .... Take 1 Tablet By Mouth Three Times A Day 9)  Zyrtec Hives Relief 10 Mg Tabs (Cetirizine Hcl) .... One Daily  Allergies  (verified): 1)  ! * Oxycodone   Review of Systems       wt down 5#,   Vital Signs:  Patient profile:   40 year old female Height:      68 inches (172.72 cm) Weight:      292.8 pounds (133.09 kg) BMI:     44.68 Temp:     98.1 degrees F (36.72 degrees C) oral Pulse rate:   77 / minute BP sitting:   154 / 84  (right arm)  Vitals Entered By: Wendall Mola CMA Duncan Dull) (Nov 12, 2009 10:40 AM) CC: follow-up visit, lab results Is Patient Diabetic? No Pain Assessment Patient in pain? no      Nutritional Status BMI of > 30 = obese Nutritional Status Detail appetite "good"  Does patient need assistance? Functional Status Self care Ambulation Normal Comments no missed doses of meds per patient           Prevention For Positives: 11/12/2009   Safe sex practices discussed with patient. Condoms offered.                             Physical Exam  General:  well-nourished, well-hydrated, and overweight-appearing.   Eyes:  pupils equal, pupils round, and pupils reactive to light.   Mouth:  pharynx pink and moist and no exudates.   Neck:  no masses.   Lungs:  normal respiratory effort and normal breath sounds.   Heart:  normal rate, regular rhythm, and no murmur.   Abdomen:  soft, non-tender, and normal bowel sounds.  obese   Impression & Recommendations:  Problem # 1:  HIV DISEASE (ICD-042) needs PAP, has prev had at Dr Clearance Coots. Will sched appt. given condoms. offered to start her on ART but she is again reluctant. spoke with her about regimens and their lack of toxicity. she is concerned as she is supporting her 4 children. she will return to clinic 3-4 months with labs prior.   Problem # 2:  OBESITY, UNSPECIFIED (ICD-278.00) continue to walk, watch diet.   Problem # 3:  HYPERTENSION (ICD-401.9) will get her into IM clinic.  Her updated medication list for this problem includes:    Maxzide-25 37.5-25 Mg Tabs (Triamterene-hctz) ..... One by mouth once  daily  Other Orders: Est. Patient Level IV (16109) Future Orders: T-CD4SP ([facility]) (CD4SP) ... 02/10/2010 T-HIV Viral Load [phone]) ... 02/10/2010  Appended Document: Orders Update    Clinical Lists Changes  Orders: Added new Referral order of Internal Medicine Referral (Internal) - Signed

## 2010-07-30 NOTE — Miscellaneous (Signed)
  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 Patient Level III (16109) - Signed

## 2010-08-15 ENCOUNTER — Encounter (INDEPENDENT_AMBULATORY_CARE_PROVIDER_SITE_OTHER): Payer: Self-pay | Admitting: *Deleted

## 2010-08-21 NOTE — Miscellaneous (Addendum)
  Clinical Lists Changes 

## 2010-09-17 LAB — T-HELPER CELL (CD4) - (RCID CLINIC ONLY): CD4 % Helper T Cell: 20 % — ABNORMAL LOW (ref 33–55)

## 2010-09-17 NOTE — Progress Notes (Signed)
Summary: No response from [employer] PAP @ this time for Lyrica  Phone Note Call from Patient Call back at Home Phone [phone]   Caller: Patient Reason for Call: Talk to Nurse Summary of Call: Message left, wanting to know the status of PAP request for Lyrica.  RN attempted to return call.  Message box was full.  There has not been a response from [employer] PAP at this time. Jennet Maduro RN  May 29, 2010 4:07 PM

## 2010-09-30 LAB — T-HELPER CELL (CD4) - (RCID CLINIC ONLY)
CD4 % Helper T Cell: 22 % — ABNORMAL LOW (ref 33–55)
CD4 T Cell Abs: 260 uL — ABNORMAL LOW (ref 400–2700)

## 2010-10-08 LAB — T-HELPER CELL (CD4) - (RCID CLINIC ONLY)
CD4 % Helper T Cell: 23 % — ABNORMAL LOW (ref 33–55)
CD4 T Cell Abs: 340 uL — ABNORMAL LOW (ref 400–2700)

## 2010-10-29 ENCOUNTER — Other Ambulatory Visit: Payer: Self-pay

## 2010-10-31 ENCOUNTER — Other Ambulatory Visit (INDEPENDENT_AMBULATORY_CARE_PROVIDER_SITE_OTHER): Payer: Self-pay

## 2010-10-31 ENCOUNTER — Other Ambulatory Visit: Payer: Self-pay | Admitting: Infectious Diseases

## 2010-10-31 ENCOUNTER — Other Ambulatory Visit: Payer: Self-pay | Admitting: *Deleted

## 2010-10-31 DIAGNOSIS — B20 Human immunodeficiency virus [HIV] disease: Secondary | ICD-10-CM

## 2010-10-31 NOTE — Progress Notes (Signed)
Addended by: Starleen Arms on: 10/31/2010 05:17 PM   Modules accepted: Orders

## 2010-11-01 LAB — COMPLETE METABOLIC PANEL WITH GFR
ALT: 13 U/L (ref 0–35)
AST: 18 U/L (ref 0–37)
Albumin: 3.8 g/dL (ref 3.5–5.2)
Alkaline Phosphatase: 53 U/L (ref 39–117)
Calcium: 8.9 mg/dL (ref 8.4–10.5)
Chloride: 100 mEq/L (ref 96–112)
Potassium: 3.4 mEq/L — ABNORMAL LOW (ref 3.5–5.3)
Sodium: 138 mEq/L (ref 135–145)
Total Protein: 8.5 g/dL — ABNORMAL HIGH (ref 6.0–8.3)

## 2010-11-01 LAB — CBC WITH DIFFERENTIAL/PLATELET
Basophils Absolute: 0 10*3/uL (ref 0.0–0.1)
Basophils Relative: 0 % (ref 0–1)
HCT: 34.4 % — ABNORMAL LOW (ref 36.0–46.0)
Lymphocytes Relative: 46 % (ref 12–46)
MCHC: 32 g/dL (ref 30.0–36.0)
Monocytes Absolute: 0.5 10*3/uL (ref 0.1–1.0)
Neutro Abs: 1.4 10*3/uL — ABNORMAL LOW (ref 1.7–7.7)
Neutrophils Relative %: 38 % — ABNORMAL LOW (ref 43–77)
Platelets: 188 10*3/uL (ref 150–400)
RDW: 15 % (ref 11.5–15.5)
WBC: 3.6 10*3/uL — ABNORMAL LOW (ref 4.0–10.5)

## 2010-11-01 LAB — GC/CHLAMYDIA PROBE AMP, URINE: Chlamydia, Swab/Urine, PCR: NEGATIVE

## 2010-11-01 LAB — T-HELPER CELL (CD4) - (RCID CLINIC ONLY)
CD4 % Helper T Cell: 17 % — ABNORMAL LOW (ref 33–55)
CD4 T Cell Abs: 280 uL — ABNORMAL LOW (ref 400–2700)

## 2010-11-01 LAB — RPR

## 2010-11-11 ENCOUNTER — Ambulatory Visit (INDEPENDENT_AMBULATORY_CARE_PROVIDER_SITE_OTHER): Payer: Self-pay | Admitting: Infectious Diseases

## 2010-11-11 ENCOUNTER — Ambulatory Visit: Payer: Self-pay

## 2010-11-11 ENCOUNTER — Telehealth: Payer: Self-pay | Admitting: *Deleted

## 2010-11-11 ENCOUNTER — Encounter: Payer: Self-pay | Admitting: Infectious Diseases

## 2010-11-11 DIAGNOSIS — B20 Human immunodeficiency virus [HIV] disease: Secondary | ICD-10-CM

## 2010-11-11 DIAGNOSIS — J329 Chronic sinusitis, unspecified: Secondary | ICD-10-CM

## 2010-11-11 DIAGNOSIS — I10 Essential (primary) hypertension: Secondary | ICD-10-CM

## 2010-11-11 MED ORDER — EMTRICITAB-RILPIVIR-TENOFOV DF 200-25-300 MG PO TABS: 1.0000 | ORAL_TABLET | Freq: Every day | ORAL | Status: DC

## 2010-11-11 MED ORDER — BENAZEPRIL HCL 10 MG PO TABS: 10.0000 mg | ORAL_TABLET | Freq: Every day | ORAL | Status: DC

## 2010-11-11 MED ORDER — AZITHROMYCIN 500 MG PO TABS: 500.0000 mg | ORAL_TABLET | Freq: Every day | ORAL | Status: AC

## 2010-11-11 MED ORDER — LEVOCETIRIZINE DIHYDROCHLORIDE 5 MG PO TABS: 5.0000 mg | ORAL_TABLET | Freq: Every evening | ORAL | Status: DC

## 2010-11-11 NOTE — Assessment & Plan Note (Signed)
Will start her on new anti-histamine. Add tri-pack for her otitis on R.

## 2010-11-11 NOTE — Telephone Encounter (Signed)
She is aware that she will not be seen until June.

## 2010-11-11 NOTE — Assessment & Plan Note (Addendum)
Will add ACE-I. She needs f/u with IM.

## 2010-11-11 NOTE — Assessment & Plan Note (Addendum)
Needs to be on therapy. She is instructed about this and various drugs. She needs to see medication assistance personal. She is not sexually active. She makes it very clear she does not want any more children.  She needs to get plugged in with ADAP. rtc 1-2 months.

## 2010-11-11 NOTE — Progress Notes (Signed)
  Subjective:    Patient ID: Anita Pacheco, female    DOB: Oct 28, 1970, 40 y.o.   MRN: [id number]  HPI 40 yo F with HIV+ not on art, HTN and anxiety. Last CD4 280, VL 22,400 (10-31-10). Complains of pain in R ear. Also having states her BP has been up at home, she has been "stressed at home". Has been having sinus problems since march- has been taking allegra-d or zyrtec-d. Has nasonex and uses every day. Can feel fluid moving around in her sinuses and in her ear.    Review of Systems     Objective:   Physical Exam  Constitutional: She appears well-developed and well-nourished.  HENT:  Head:    Eyes: EOM are normal. Pupils are equal, round, and reactive to light.  Neck: Neck supple.  Cardiovascular: Normal rate, regular rhythm and normal heart sounds.   Pulmonary/Chest: Effort normal and breath sounds normal. No respiratory distress.  Abdominal: Soft. Bowel sounds are normal. She exhibits no distension.          Assessment & Plan:

## 2010-11-12 ENCOUNTER — Ambulatory Visit: Payer: Self-pay | Admitting: Infectious Diseases

## 2010-11-13 ENCOUNTER — Ambulatory Visit: Payer: Self-pay

## 2010-11-15 ENCOUNTER — Ambulatory Visit: Payer: Self-pay

## 2010-12-10 ENCOUNTER — Telehealth: Payer: Self-pay | Admitting: Licensed Clinical Social Worker

## 2010-12-10 NOTE — Telephone Encounter (Signed)
Patient was told by Dr. Ninetta Lights to make an appointment to see the Outpatient clinic but they are saying that they are not accepting new patients and someone needs to call over there for a referral based on her need.

## 2010-12-12 NOTE — Telephone Encounter (Signed)
Patient will go to [facility] with her discount card and pick up new patient packet.  She will also ask how long the wait list is there. Wendall Mola CMA

## 2010-12-25 ENCOUNTER — Ambulatory Visit: Payer: Self-pay | Admitting: Infectious Diseases

## 2011-01-10 ENCOUNTER — Encounter: Payer: Self-pay | Admitting: *Deleted

## 2011-01-28 ENCOUNTER — Ambulatory Visit (INDEPENDENT_AMBULATORY_CARE_PROVIDER_SITE_OTHER): Payer: Self-pay | Admitting: Internal Medicine

## 2011-01-28 ENCOUNTER — Encounter: Payer: Self-pay | Admitting: Internal Medicine

## 2011-01-28 VITALS — BP 151/102 | HR 90 | Temp 97.4°F | Ht 67.5 in | Wt 286.9 lb

## 2011-01-28 DIAGNOSIS — Z139 Encounter for screening, unspecified: Secondary | ICD-10-CM

## 2011-01-28 DIAGNOSIS — Z1231 Encounter for screening mammogram for malignant neoplasm of breast: Secondary | ICD-10-CM

## 2011-01-28 DIAGNOSIS — B20 Human immunodeficiency virus [HIV] disease: Secondary | ICD-10-CM

## 2011-01-28 DIAGNOSIS — F411 Generalized anxiety disorder: Secondary | ICD-10-CM

## 2011-01-28 DIAGNOSIS — J329 Chronic sinusitis, unspecified: Secondary | ICD-10-CM

## 2011-01-28 DIAGNOSIS — I10 Essential (primary) hypertension: Secondary | ICD-10-CM

## 2011-01-28 MED ORDER — AMOXICILLIN-POT CLAVULANATE 875-125 MG PO TABS: 1.0000 | ORAL_TABLET | Freq: Two times a day (BID) | ORAL | Status: AC

## 2011-01-28 MED ORDER — TRIAMTERENE-HCTZ 37.5-25 MG PO TABS: 1.0000 | ORAL_TABLET | Freq: Every day | ORAL | Status: DC

## 2011-01-28 MED ORDER — FLUTICASONE PROPIONATE 50 MCG/ACT NA SUSP: NASAL | Status: DC

## 2011-01-28 MED ORDER — BENAZEPRIL HCL 10 MG PO TABS: 10.0000 mg | ORAL_TABLET | Freq: Every day | ORAL | Status: DC

## 2011-01-28 NOTE — Progress Notes (Signed)
HPI: Ms. Rudzinski is a 40 yo woman with PMH of HIV dx 6 yrs ago not currently on ART, HTN, OCD, anxiety presents today for ear fullness x 7 months bilaterally by R>L with mild hearing loss.  She feels as if she is hearing through water. Denies any ear pain or drainage. She is still using q-tip to clean her ears because the cerumen is making her uncomfortable.  She also complains sinus congestion and tenderness that has been going on in the past several months in which Claritin, Zyrtec did not help relieve her symptoms. Dr. Tiburcio Pea which helps her symptoms; however, patient states that she cannot afford it.  She was also prescribed Zpack which her symptoms for about one week; however, the symptoms returned.   She denies any cough, fever, chills, or n/v.  HTN- has not been taking the new meds prescribed by dr Ninetta Lights yet because of insurance problems.  She has been taking her Maxzide instead.  HIV- will start her meds soon since they have not delivered her meds.  Last CD4 was 220 with VL 22,300 in 10/2010. Being followed by Dr. Ninetta Lights.  Generalized anxiety-  She states that she has OCD and would repeatedly clean her house because of fear of germs.  She normally worries about everything which used to prevent her from holding a job.  But she is able to control it now and is currently working, and does not wants any meds for it at this time.  Preventive: Patient has not had a mammogram in the past and her last Pap smear was several years ago.  ROS: as per HPI  PE: General: alert, well-developed, and cooperative to examination.  Head: + mild tenderness of maxillary and frontal sinuses. Ears: TM normal bilaterally, no erythema/tenderness/drainage.  Questionable cholesteoma on right ear.     Lungs: normal respiratory effort, no accessory muscle use, normal breath sounds, no crackles, and no wheezes. Heart: normal rate, regular rhythm, no murmur, no gallop, and no rub.  Abdomen: soft, non-tender,  normal bowel sounds, no distention, no guarding, no rebound tenderness Msk: no joint swelling, no joint warmth, and no redness over joints.  Pulses: 2+ DP/PT pulses bilaterally Extremities: No cyanosis, clubbing, edema Neurologic: alert & oriented X3, cranial nerves II-XII intact, strength normal in all extremities, sensation intact to light touch, and gait normal.  Skin: turgor normal and no rashes.  Psych: Oriented X3, memory intact for recent and remote, normally interactive, good eye contact, + anxious, not depressed

## 2011-01-28 NOTE — Assessment & Plan Note (Signed)
Stable. Last CD4 count was 280, Viral load 22,400 in 10/2010.  Patient is waiting for her medication to be delivered. She will start Complera as prescribed by Dr. Ninetta Lights.

## 2011-01-28 NOTE — Assessment & Plan Note (Signed)
Stable. Patient states that she can control her symptoms without any medication at this time. I explained to patient that if her symptoms worsen, we can prescribe an SSRI to help her.

## 2011-01-28 NOTE — Assessment & Plan Note (Signed)
There is maxillary and frontal tenderness on examination which is suggestive of chronic sinusitis.  She also had erythema and edema of the nasal mucosa. -Will start Augmentin 875 by mouth twice a day x14 day -Recommend patient to use Flonase after rinsing her nose with [employer] -She may try over-the-counter antihistamine for her allergy symptoms since she cannot afford Xyzal. -Referred to ENT for further evaluation of her ear fullness/hearing loss

## 2011-01-28 NOTE — Assessment & Plan Note (Addendum)
Not adequately controlled. Blood pressure was in the 150/100's.  She is currently taking Maxzide only and has not been taking the Lotensin prescribed by Dr. Ninetta Lights due to insurance problem. Patient does have the orange card now; therefore, she can get medications from the [facility]. -Will continue Maxzide 37.5/25mg  po qd -Will start Lotensin 10 mg by mouth daily -Advise low salt diet -Followup in 3-4 weeks

## 2011-01-28 NOTE — Patient Instructions (Addendum)
Please continue to take Maxzide one tablet daily Start taking Lotensin 10mg  one tablet daily Start taking Augmentin one tablet twice daily x 14 days Will refer you to ENT for your ear/sinus problems Will need to schedule an appointment for Pap smear next office visit Will schedule mammogram  Low salt diet Follow up with Dr. Anselm Jungling in 3-4 weeks

## 2011-01-30 ENCOUNTER — Ambulatory Visit: Payer: Self-pay

## 2011-01-31 NOTE — Progress Notes (Signed)
Pt aware of mammogram [facility] 02/07/11 2PM. Stanton Kidney Keydi Giel RN 01/31/11 2:30PM

## 2011-02-07 ENCOUNTER — Ambulatory Visit (HOSPITAL_COMMUNITY)
Admission: RE | Admit: 2011-02-07 | Discharge: 2011-02-07 | Disposition: A | Discharge status: Home / Self Care | Payer: Self-pay | Source: Ambulatory Visit | Attending: Internal Medicine | Admitting: Internal Medicine

## 2011-02-07 DIAGNOSIS — Z1231 Encounter for screening mammogram for malignant neoplasm of breast: Secondary | ICD-10-CM | POA: Insufficient documentation

## 2011-02-12 ENCOUNTER — Other Ambulatory Visit: Payer: Self-pay | Admitting: Internal Medicine

## 2011-02-12 DIAGNOSIS — R928 Other abnormal and inconclusive findings on diagnostic imaging of breast: Secondary | ICD-10-CM

## 2011-02-18 ENCOUNTER — Ambulatory Visit (INDEPENDENT_AMBULATORY_CARE_PROVIDER_SITE_OTHER): Payer: Self-pay | Admitting: Internal Medicine

## 2011-02-18 ENCOUNTER — Encounter: Payer: Self-pay | Admitting: Internal Medicine

## 2011-02-18 DIAGNOSIS — N898 Other specified noninflammatory disorders of vagina: Secondary | ICD-10-CM

## 2011-02-18 DIAGNOSIS — I10 Essential (primary) hypertension: Secondary | ICD-10-CM

## 2011-02-18 LAB — CBC
Hemoglobin: 11.6 g/dL — ABNORMAL LOW (ref 12.0–15.0)
MCH: 26.6 pg (ref 26.0–34.0)
MCHC: 31.9 g/dL (ref 30.0–36.0)
RDW: 14.9 % (ref 11.5–15.5)

## 2011-02-18 MED ORDER — AMLODIPINE BESYLATE 10 MG PO TABS: 10.0000 mg | ORAL_TABLET | Freq: Every day | ORAL | Status: DC

## 2011-02-18 MED ORDER — FLUCONAZOLE 150 MG PO TABS: 150.0000 mg | ORAL_TABLET | Freq: Once | ORAL | Status: AC

## 2011-02-18 MED ORDER — CETIRIZINE HCL 10 MG PO TABS: 10.0000 mg | ORAL_TABLET | Freq: Every day | ORAL | Status: DC

## 2011-02-18 NOTE — Patient Instructions (Signed)
Stop taking Benazepril and maxzide (triamterene-hctz)  Start taking Amlodipine 10mg  one tablet daily Take Diflucan 1 tablet today, if your symptoms do not improve, you may take another tablet in 1 week Will do Pap Smear today and I will call you with any abnormal lab results Please make sure you follow up with Dr. Debby Bud for your ear problems Follow up in 2-3 weeks

## 2011-02-18 NOTE — Progress Notes (Signed)
History of present illness: Anita Pacheco is a 40 year old woman with past medical history of HIV, hypertension, right ear tenderness presents today for followup on her BP meds. She reports that Maxzide gives her a headache and Benazepril makes her dizzy, physically sick, and nauseated. In addition, she also reports having intolerance to Lasix in the past which gave her "bad heart cramping". She has not started taking her HIV meds because it has not been delivered.    Right ear: hearing problem is intermittent and still has some tenderness.  She was supposed to see Dr. Debby Bud, but there was a misunderstanding about the copay, so they have to reschedule her appointment therefore, she is still waiting. She started taking Augmentin on 02/02/11 and started to develop yeast infection- irritation, milky discharge, itching.  She used monostat once but still has some irritation.  As for her nasal symptoms, it is much improved with the netty pot and flonase.   No other complaints today, denies any fever, chills.     Review of systems: As per history of present illness  Physical examination: General: alert, well-developed, and cooperative to examination.   Lungs: normal respiratory effort, no accessory muscle use, normal breath sounds, no crackles, and no wheezes. Heart: normal rate, regular rhythm, no murmur, no gallop, and no rub.  Abdomen: soft, non-tender, normal bowel sounds, no distention, no guarding, no rebound tenderness GU: PAP performed.  There is thick white discharge in vaginal canal and cervical ox.  Mild bloody discharge but no erythema/tenderness.  Cervical motion showed no tenderness, no adnexal masses or tenderness.   Msk: no joint swelling, no joint warmth, and no redness over joints.  Pulses: 2+ DP/PT pulses bilaterally Extremities: No cyanosis, clubbing, edema Psych: very anxious but not depressed appearing Neurologic: alert & oriented X3, cranial nerves II-XII intact, strength normal in  all extremities, sensation intact to light touch, and gait normal.

## 2011-02-18 NOTE — Assessment & Plan Note (Addendum)
Poorly uncontrolled.  She is also orthostatic by pulse, unclear etiology.  Differential dx meds vs. Autonomics.  She denies any bleeding at this time.   She is also intolerant of Lasix, Maxzide and Benazepril -Will d/c Benazepril & Triamterene/hctz -Start Norvasc 10mg  po qd -Will check CBC to make sure that her Hb is stable.  -Advise patient to wait for a few minutes after she stands up from a sitting position so that she would not be dizzy. -Will reevaluate in 2 weeks

## 2011-02-18 NOTE — Assessment & Plan Note (Addendum)
Likely yeast infection but needs to rule out other STDs.  Performed PAP and send for wet prep -Will empirically treat with Diflucan  Addendum: Wet prep showed trichomonas vaginosis.  Will treat with Flagyl 500mg  po bid x 7 days.  Partner needs to be treated as well.  Will call in prescription for patient.

## 2011-02-19 ENCOUNTER — Other Ambulatory Visit: Payer: Self-pay | Admitting: Internal Medicine

## 2011-02-19 LAB — BASIC METABOLIC PANEL WITH GFR
BUN: 12 mg/dL (ref 6–23)
Calcium: 9.2 mg/dL (ref 8.4–10.5)
GFR, Est African American: >60 mL/min (ref >60)
GFR, Est Non African American: >60 mL/min (ref >60)
Glucose, Bld: 81 mg/dL (ref 70–99)
Sodium: 138 mEq/L (ref 135–145)

## 2011-02-19 LAB — GC/CHLAMYDIA PROBE AMP, GENITAL: GC Probe Amp, Genital: NEGATIVE

## 2011-02-19 LAB — WET PREP BY MOLECULAR PROBE: Trichomonas vaginosis: POSITIVE — AB

## 2011-02-19 MED ORDER — METRONIDAZOLE 500 MG PO TABS: 500.0000 mg | ORAL_TABLET | Freq: Three times a day (TID) | ORAL | Status: AC

## 2011-02-20 ENCOUNTER — Telehealth: Payer: Self-pay | Admitting: *Deleted

## 2011-02-20 ENCOUNTER — Ambulatory Visit
Admission: RE | Admit: 2011-02-20 | Discharge: 2011-02-20 | Disposition: A | Discharge status: Home / Self Care | Payer: No Typology Code available for payment source | Source: Ambulatory Visit | Attending: Internal Medicine | Admitting: Internal Medicine

## 2011-02-20 DIAGNOSIS — R928 Other abnormal and inconclusive findings on diagnostic imaging of breast: Secondary | ICD-10-CM

## 2011-02-20 NOTE — Telephone Encounter (Signed)
Message copied by Maura Crandall on Thu Feb 20, 2011  4:51 PM ------      Message from: HO, MICHELE T      Created: Wed Feb 19, 2011  2:40 PM       Positive for Trichomonas vaginosis.  Will treat with Flagyl 500mg  po bid x 7 days.  Partner needs to be treated as well.      Please call in prescription for patient.

## 2011-02-24 NOTE — Telephone Encounter (Signed)
Pt informed on 02/20/11.Kingsley Spittle Cassady8/27/20122:20 PM

## 2011-03-04 ENCOUNTER — Ambulatory Visit (INDEPENDENT_AMBULATORY_CARE_PROVIDER_SITE_OTHER): Payer: No Typology Code available for payment source | Admitting: Internal Medicine

## 2011-03-04 ENCOUNTER — Encounter: Payer: Self-pay | Admitting: Internal Medicine

## 2011-03-04 DIAGNOSIS — I10 Essential (primary) hypertension: Secondary | ICD-10-CM

## 2011-03-04 DIAGNOSIS — L299 Pruritus, unspecified: Secondary | ICD-10-CM

## 2011-03-04 DIAGNOSIS — G609 Hereditary and idiopathic neuropathy, unspecified: Secondary | ICD-10-CM

## 2011-03-04 DIAGNOSIS — Z23 Encounter for immunization: Secondary | ICD-10-CM

## 2011-03-04 NOTE — Patient Instructions (Signed)
Please call breast center to schedule Bilateral diagnostic mammograms and bilateral axillary ultrasounds in 3 months~ November 2012. You can buy Benadryl Gel over the counter for itching, if it continues to itch, please come back so I can check some bloodwork Continue taking your blood pressure medication Follow-up with Dr. Anselm Jungling in 4-6 weeks

## 2011-03-04 NOTE — Progress Notes (Signed)
Patient is a 40 year old woman with past medical history of HIV, peripheral neuropathy, uncontrolled hypertension, generalized anxiety disorder presents for follow up on her BP.  She is tolerating Norvasc very well without any side effects.  She is scheduled to see ENT-Dr. Salomon Fick on 03/10/11 for her ear problem.  She is very worried about hearing bad news from doctors and does not want to overwhelm herself with more medications.  She is requesting to observe her BP for another month or so before we make any changes to her BP meds.  She still has not received her HIV meds and does not know who to contact.  She has some itching on her palms and anterior shin bilaterally in the past few days but denies any erythema/drainage or any other systemic symptoms, fever or chills.  She does have numbness and tingling in her hands and has been taking Lyrica which helps with her symptoms.  She finished the Flagyl for trichomonas and that her vaginal discharge is now resolved.  She also states that she has not been with "that person" in over 3 years and has not been to a gynecologist in 3 years as well.    Patient had diagnostic mammogram and u/s in 01/2011 which showed "mildly enlarged bilateral axillary lymph nodes. Given bilaterality, these lymph nodes are likely benign/reactive and probably related to the patient's HIV infection. However, 64-month follow-up is  recommended to ensure stability."  Patient is aware and understands that she needs to follow up in November for the lymph nodes.  ROS: as per HPI  PE: General: alert, well-developed, and cooperative to examination.  Lungs: normal respiratory effort, no accessory muscle use, normal breath sounds, no crackles, and no wheezes. Heart: normal rate, regular rhythm, no murmur, no gallop, and no rub.  Abdomen: obese, soft, non-tender, normal bowel sounds, no distention, no guarding, no rebound tenderness Msk: no joint swelling, no joint warmth, and no redness over  joints.  Pulses: 2+ DP/PT pulses bilaterally Extremities: No cyanosis, clubbing, edema Neurologic: alert & oriented X3, cranial nerves II-XII intact, strength normal in all extremities, sensation intact to light touch, and gait normal.  Skin: turgor normal and no rashes.  Psych: Oriented X3, memory intact for recent and remote, normally interactive, good eye contact, + anxious appearing, fidgety.  Not depressed appearing

## 2011-03-04 NOTE — Assessment & Plan Note (Addendum)
Improved.  BP on arrival was 153/97  and repeat was 138/91.  We discussed extensively about her BP and she wants to defer adding another BP med agent.  Her BP is actually well controlled with Norvasc 10mg ; therefore, will continue Norvasc for now.  If her BP is elevated during future office visits, will consider adding an ARB or BB since she is intolerant to ACEi and diuretics. Will recheck in 4-6 wks

## 2011-03-05 DIAGNOSIS — L299 Pruritus, unspecified: Secondary | ICD-10-CM | POA: Insufficient documentation

## 2011-03-05 NOTE — Assessment & Plan Note (Signed)
This is likely secondary to HIV versus atopic dermatitis. Although differential diagnosis for pruritis are very broad which include: Malignancy, thyroid disease, HIV, Cholestasis, renal disease, diabetes, psychogenic, or neurologic.   -Will treat with topical Benadryl for symptoms relief, if she continues to have pruritus, I will check TSH, LFTs, BMP

## 2011-03-05 NOTE — Assessment & Plan Note (Signed)
Improving. Will continue Lyrica 50 mg by mouth 3 times a day

## 2011-03-06 NOTE — Progress Notes (Signed)
I have reviewed and discussed the care of this patient in detail with the resident physician including pertinent patient records, physical exam findings and data. I agree with details of this encounter.   

## 2011-03-25 ENCOUNTER — Other Ambulatory Visit: Payer: Self-pay | Admitting: *Deleted

## 2011-03-25 DIAGNOSIS — B20 Human immunodeficiency virus [HIV] disease: Secondary | ICD-10-CM

## 2011-03-25 MED ORDER — EMTRICITAB-RILPIVIR-TENOFOV DF 200-25-300 MG PO TABS: 1.0000 | ORAL_TABLET | Freq: Every day | ORAL | Status: DC

## 2011-03-25 NOTE — Telephone Encounter (Signed)
[facility] called to say she has not been renewing her med (complera) and has not done the ADAP for this 6 months. If they get a rx in before Sunday, she will have #30 days of meds. I sent it to [facility] in [place]. I LM on her phone asking her to call & make an appt

## 2011-03-28 LAB — T-HELPER CELL (CD4) - (RCID CLINIC ONLY): CD4 % Helper T Cell: 23 — ABNORMAL LOW

## 2011-04-01 ENCOUNTER — Encounter: Payer: Self-pay | Admitting: Internal Medicine

## 2011-04-01 ENCOUNTER — Ambulatory Visit: Payer: No Typology Code available for payment source

## 2011-04-01 ENCOUNTER — Ambulatory Visit (INDEPENDENT_AMBULATORY_CARE_PROVIDER_SITE_OTHER): Payer: Self-pay | Admitting: Internal Medicine

## 2011-04-01 ENCOUNTER — Other Ambulatory Visit: Payer: Self-pay | Admitting: *Deleted

## 2011-04-01 DIAGNOSIS — I10 Essential (primary) hypertension: Secondary | ICD-10-CM

## 2011-04-01 DIAGNOSIS — B20 Human immunodeficiency virus [HIV] disease: Secondary | ICD-10-CM

## 2011-04-01 MED ORDER — EMTRICITAB-RILPIVIR-TENOFOV DF 200-25-300 MG PO TABS: 1.0000 | ORAL_TABLET | Freq: Every day | ORAL | Status: DC

## 2011-04-01 NOTE — Patient Instructions (Signed)
Follow up with Dr. Anselm Jungling in 3-4 months

## 2011-04-01 NOTE — Assessment & Plan Note (Signed)
Adequately controlled.  BP on arrival was 147/95 and repeat BP at the end of the visit was 137/84.  She also reports checking her BP at home and it is usually 120/80's.  Her BP might be slightly elevated during clinic visit 2/2 anxiety; however, patient does not want any SSRIs at this time. -Will continue current regimen Norvasc 10mg  po qd since patient does not have any side effect from this med.  Will not add ARB/BB at this time.   -Follow up in 3-4 months

## 2011-04-01 NOTE — Progress Notes (Signed)
HPI: [name] is a 40 yo woman with HIV, HTN, anxiety disorder presents today for follow up on her BP.  She states that she has been taking her Norvasc 10mg  daily and that her BPs at home usually in the 120's/80's.  She reports feeling very anxious whenever she comes to clinic which makes her heart palpitate and her BP goes up.  She also started taking her HIV meds about 1 week ago and has been sleeping more during the day; otherwise, no other side effects.  She would wake up to get her kids ready for school and then goes back to sleep until 1PM.  She is adamant about not taking more medicines.  She states that her itching is almost completely resolved with Benadryl sprays.  She is doing well otherwise.  She has an appointment with Dr. Ninetta Lights at the end of October for bloodwork.  ROS: as per HPI  PE: General: alert, well-developed, and cooperative to examination.  Lungs: normal respiratory effort, no accessory muscle use, normal breath sounds, no crackles, and no wheezes. Heart: normal rate, regular rhythm, no murmur, no gallop, and no rub.  Abdomen: soft, non-tender, normal bowel sounds, no distention, no guarding, no rebound tenderness Msk: no joint swelling, no joint warmth, and no redness over joints.  Pulses: 2+ DP/PT pulses bilaterally Extremities: No cyanosis, clubbing, edema Neurologic: alert & oriented X3, cranial nerves II-XII intact, strength normal in all extremities, sensation intact to light touch, and gait normal.  Skin: turgor normal and no rashes.  Psych: Oriented X3, memory intact for recent and remote, normally interactive, good eye contact, very anxious

## 2011-04-04 LAB — T-HELPER CELL (CD4) - (RCID CLINIC ONLY): CD4 % Helper T Cell: 22 % — ABNORMAL LOW (ref 33–55)

## 2011-04-10 ENCOUNTER — Other Ambulatory Visit: Payer: Self-pay | Admitting: Infectious Diseases

## 2011-04-10 ENCOUNTER — Other Ambulatory Visit (INDEPENDENT_AMBULATORY_CARE_PROVIDER_SITE_OTHER): Payer: No Typology Code available for payment source

## 2011-04-10 DIAGNOSIS — B20 Human immunodeficiency virus [HIV] disease: Secondary | ICD-10-CM

## 2011-04-10 LAB — CBC
HCT: 36 % (ref 36.0–46.0)
Hemoglobin: 11.8 g/dL — ABNORMAL LOW (ref 12.0–15.0)
MCH: 27.3 pg (ref 26.0–34.0)
MCHC: 32.8 g/dL (ref 30.0–36.0)
MCV: 83.3 fL (ref 78.0–100.0)
RDW: 14.5 % (ref 11.5–15.5)

## 2011-04-10 LAB — COMPREHENSIVE METABOLIC PANEL
ALT: 17 U/L (ref 0–35)
AST: 22 U/L (ref 0–37)
Alkaline Phosphatase: 57 U/L (ref 39–117)
Calcium: 8.9 mg/dL (ref 8.4–10.5)
Chloride: 101 mEq/L (ref 96–112)
Creat: 0.86 mg/dL (ref 0.50–1.10)

## 2011-04-10 LAB — T-HELPER CELL (CD4) - (RCID CLINIC ONLY)
CD4 % Helper T Cell: 20 — ABNORMAL LOW
CD4 T Cell Abs: 300 — ABNORMAL LOW

## 2011-04-11 LAB — T-HELPER CELL (CD4) - (RCID CLINIC ONLY)
CD4 % Helper T Cell: 17 % — ABNORMAL LOW (ref 33–55)
CD4 T Cell Abs: 260 uL — ABNORMAL LOW (ref 400–2700)

## 2011-04-14 ENCOUNTER — Other Ambulatory Visit: Payer: Self-pay | Admitting: *Deleted

## 2011-04-14 ENCOUNTER — Telehealth: Payer: Self-pay | Admitting: Infectious Diseases

## 2011-04-14 DIAGNOSIS — B20 Human immunodeficiency virus [HIV] disease: Secondary | ICD-10-CM

## 2011-04-14 DIAGNOSIS — G629 Polyneuropathy, unspecified: Secondary | ICD-10-CM

## 2011-04-14 LAB — HIV-1 RNA ULTRAQUANT REFLEX TO GENTYP+: HIV-1 RNA Quant, Log: 4.52 {Log} — ABNORMAL HIGH (ref <1.30)

## 2011-04-14 MED ORDER — PREGABALIN 50 MG PO CAPS: 50.0000 mg | ORAL_CAPSULE | Freq: Three times a day (TID) | ORAL | Status: DC

## 2011-04-14 NOTE — Telephone Encounter (Signed)
Called patient.  Left message to return call.  Needs to re-apply with [employer] for her Lyrica

## 2011-04-21 ENCOUNTER — Telehealth: Payer: Self-pay | Admitting: Infectious Diseases

## 2011-04-21 NOTE — Telephone Encounter (Signed)
Called patient about her [place].  Left message for pt to call me back.

## 2011-04-21 NOTE — Telephone Encounter (Signed)
Pt called will be in on 10-26 to renew her app for Lyrica

## 2011-04-22 LAB — HIV-1 GENOTYPR PLUS

## 2011-04-25 ENCOUNTER — Ambulatory Visit (INDEPENDENT_AMBULATORY_CARE_PROVIDER_SITE_OTHER): Payer: No Typology Code available for payment source | Admitting: Infectious Diseases

## 2011-04-25 ENCOUNTER — Encounter: Payer: Self-pay | Admitting: Infectious Diseases

## 2011-04-25 VITALS — BP 128/88 | HR 88 | Temp 98.1°F | Ht 67.5 in | Wt 283.0 lb

## 2011-04-25 DIAGNOSIS — N63 Unspecified lump in unspecified breast: Secondary | ICD-10-CM

## 2011-04-25 DIAGNOSIS — B370 Candidal stomatitis: Secondary | ICD-10-CM | POA: Insufficient documentation

## 2011-04-25 DIAGNOSIS — J309 Allergic rhinitis, unspecified: Secondary | ICD-10-CM

## 2011-04-25 DIAGNOSIS — F411 Generalized anxiety disorder: Secondary | ICD-10-CM

## 2011-04-25 DIAGNOSIS — Z23 Encounter for immunization: Secondary | ICD-10-CM

## 2011-04-25 DIAGNOSIS — B20 Human immunodeficiency virus [HIV] disease: Secondary | ICD-10-CM

## 2011-04-25 HISTORY — DX: Unspecified lump in unspecified breast: N63.0

## 2011-04-25 MED ORDER — EMTRICITAB-RILPIVIR-TENOFOV DF 200-25-300 MG PO TABS: 1.0000 | ORAL_TABLET | Freq: Every day | ORAL | Status: DC

## 2011-04-25 MED ORDER — FLUCONAZOLE 100 MG PO TABS: 100.0000 mg | ORAL_TABLET | Freq: Every day | ORAL | Status: AC

## 2011-04-25 NOTE — Assessment & Plan Note (Signed)
Has repeat imaging schedule.

## 2011-04-25 NOTE — Assessment & Plan Note (Signed)
She continues to be extremely anxious, does not want more pills. Her anxiety definitely affects her medical decision making.

## 2011-04-25 NOTE — Assessment & Plan Note (Signed)
She will try zyrtec-d.

## 2011-04-25 NOTE — Telephone Encounter (Signed)
She is here to see md. She signed the app for the lyrica. When md signed app & rx, it will be sent. Also asked that I refill her hiv meds. done

## 2011-04-25 NOTE — Assessment & Plan Note (Signed)
i am not clear she is taking her ART despite her saying she is. Her CD4 is unmoved and her VL is detectable, her genotype does not reflect her current meds.  Will see her back in 2 months with labs, genotype. She is offered condoms, refuses- states she still has the condoms from her initial visit. Given PNVX.

## 2011-04-25 NOTE — Progress Notes (Signed)
  Subjective:    Patient ID: Anita Pacheco, female    DOB: 10/10/1970, 40 y.o.   MRN: [id number]  HPI 40 yo F with HIV+ not on art, HTN and anxiety. Has been seen by IM and really likes Dr Anselm Jungling! Has been seen by ENT and had tubes put in. Has been taking anti-htn rx.  Last CD4 250, VL 33,100, genotype L10V, K20I, M36I (04-10-11). Has met with ADAP. Has started taking complera but didn't start until 1 month ago. Feels like her sleep cycle has been upset by this (she works 3rd shift).     Review of Systems  Constitutional: Negative for appetite change and unexpected weight change.  Gastrointestinal: Negative for diarrhea and constipation.  Genitourinary: Negative for dysuria.  Neurological: Positive for headaches.  Hematological: Positive for adenopathy.       Objective:   Physical Exam  Constitutional: She appears well-developed and well-nourished.  Eyes: EOM are normal. Pupils are equal, round, and reactive to light.  Neck: Neck supple.  Cardiovascular: Normal rate, regular rhythm and normal heart sounds.   Pulmonary/Chest: Effort normal and breath sounds normal.  Abdominal: Soft. Bowel sounds are normal. There is no tenderness.  Lymphadenopathy:    She has no cervical adenopathy.          Assessment & Plan:

## 2011-04-28 ENCOUNTER — Telehealth: Payer: Self-pay | Admitting: *Deleted

## 2011-04-28 NOTE — Telephone Encounter (Signed)
[employer]'s Connection to care app mailed for Neurontin today

## 2011-05-07 ENCOUNTER — Telehealth: Payer: Self-pay | Admitting: *Deleted

## 2011-05-07 NOTE — Telephone Encounter (Signed)
We have not heard back from PAP. Sent to Randolm Idol who is following this

## 2011-05-12 NOTE — Telephone Encounter (Signed)
Called [employer] to check on application.  Rejected - letter sent to patient.  Needed financials.  Sent to [employer] today.

## 2011-05-13 ENCOUNTER — Ambulatory Visit: Payer: No Typology Code available for payment source

## 2011-05-15 ENCOUNTER — Ambulatory Visit: Payer: No Typology Code available for payment source

## 2011-05-16 ENCOUNTER — Ambulatory Visit: Payer: No Typology Code available for payment source

## 2011-06-06 ENCOUNTER — Telehealth: Payer: Self-pay | Admitting: *Deleted

## 2011-06-06 NOTE — Telephone Encounter (Signed)
Opened to check meds & dates

## 2011-06-18 ENCOUNTER — Other Ambulatory Visit: Payer: Self-pay | Admitting: Infectious Diseases

## 2011-06-18 ENCOUNTER — Other Ambulatory Visit (INDEPENDENT_AMBULATORY_CARE_PROVIDER_SITE_OTHER): Payer: Self-pay

## 2011-06-18 DIAGNOSIS — B20 Human immunodeficiency virus [HIV] disease: Secondary | ICD-10-CM

## 2011-06-18 LAB — COMPREHENSIVE METABOLIC PANEL
ALT: 26 U/L (ref 0–35)
AST: 14 U/L (ref 0–37)
Albumin: 3.5 g/dL (ref 3.5–5.2)
Alkaline Phosphatase: 64 U/L (ref 39–117)
BUN: 13 mg/dL (ref 6–23)
Calcium: 8.9 mg/dL (ref 8.4–10.5)
Chloride: 106 mEq/L (ref 96–112)
Potassium: 3.9 mEq/L (ref 3.5–5.3)
Sodium: 138 mEq/L (ref 135–145)

## 2011-06-19 LAB — CBC
MCHC: 32.1 g/dL (ref 30.0–36.0)
Platelets: 241 10*3/uL (ref 150–400)
RDW: 15.2 % (ref 11.5–15.5)
WBC: 4.2 10*3/uL (ref 4.0–10.5)

## 2011-06-19 LAB — T-HELPER CELL (CD4) - (RCID CLINIC ONLY)
CD4 % Helper T Cell: 20 % — ABNORMAL LOW (ref 33–55)
CD4 T Cell Abs: 390 uL — ABNORMAL LOW (ref 400–2700)

## 2011-07-09 ENCOUNTER — Encounter: Payer: Self-pay | Admitting: Infectious Diseases

## 2011-07-09 ENCOUNTER — Ambulatory Visit (INDEPENDENT_AMBULATORY_CARE_PROVIDER_SITE_OTHER): Payer: Self-pay | Admitting: Infectious Diseases

## 2011-07-09 DIAGNOSIS — I10 Essential (primary) hypertension: Secondary | ICD-10-CM

## 2011-07-09 DIAGNOSIS — Z79899 Other long term (current) drug therapy: Secondary | ICD-10-CM

## 2011-07-09 DIAGNOSIS — Z789 Other specified health status: Secondary | ICD-10-CM | POA: Insufficient documentation

## 2011-07-09 DIAGNOSIS — Z113 Encounter for screening for infections with a predominantly sexual mode of transmission: Secondary | ICD-10-CM

## 2011-07-09 DIAGNOSIS — B20 Human immunodeficiency virus [HIV] disease: Secondary | ICD-10-CM

## 2011-07-09 NOTE — Assessment & Plan Note (Signed)
She appears to be ding well with the complera. Vaccines are up to date. Offered condoms (refused). Will see her back in 4 months.

## 2011-07-09 NOTE — Assessment & Plan Note (Signed)
i asked her to f/u with Dr Anselm Jungling regarding changing her amlodipine to see if this is causing her numbness

## 2011-07-09 NOTE — Progress Notes (Signed)
  Subjective:    Patient ID: Anita Pacheco, female    DOB: 03-Feb-1971, 41 y.o.   MRN: [id number]  HPI 41 yo F with HIV+ previously started on complerat, HTN and anxiety. Has been seen by IM and really likes Dr Anselm Jungling! Has been seen by ENT and had tubes put in. Has been taking anti-htn rx.  Last CD4 390, VL 33 (06-18-11), genotype L10V, K20I, M36I (04-10-11).  Has been feeling well, occas fatigue. Has been taking complera, has noted no sfx. Has questions about whether drinking ETOH will affect her tx. Occas wakes up and feels nauseated. Takes meds with food (sandwhich and fruit, or peanut butter crackers) but not full meal.  Thinks her BP medication has made her R leg numb. At end of day feels like her skin has been cut with a really sharp razor. Swollen also. No swelling L leg. Missed 4 days of norvasc, did not have these sx.      Review of Systems  Constitutional: Negative for appetite change and unexpected weight change.  Gastrointestinal: Negative for diarrhea and constipation.  Genitourinary: Negative for dysuria and menstrual problem.  Neurological: Positive for numbness.       Objective:   Physical Exam  Constitutional: She appears well-developed and well-nourished.  HENT:  Head: Normocephalic.  Mouth/Throat: No oropharyngeal exudate.  Eyes: EOM are normal. Pupils are equal, round, and reactive to light.  Neck: Neck supple.  Cardiovascular: Normal rate, regular rhythm and normal heart sounds.   Pulmonary/Chest: Effort normal and breath sounds normal. She has no wheezes.  Abdominal: Soft. Bowel sounds are normal. There is no tenderness.  Lymphadenopathy:    She has no cervical adenopathy.          Assessment & Plan:

## 2011-07-09 NOTE — Assessment & Plan Note (Signed)
She is thinking about going to [place]: i advised her that she would need Hep A vaccine, MMR update, possibly need typhoid, cholera. She would definitely need Malaria prophylaxis. She wants to defer these things and will call if she makes more firm plans.

## 2011-07-17 ENCOUNTER — Other Ambulatory Visit: Payer: Self-pay | Admitting: Internal Medicine

## 2011-07-17 DIAGNOSIS — R599 Enlarged lymph nodes, unspecified: Secondary | ICD-10-CM

## 2011-07-25 ENCOUNTER — Other Ambulatory Visit: Payer: Self-pay

## 2011-07-29 ENCOUNTER — Ambulatory Visit (INDEPENDENT_AMBULATORY_CARE_PROVIDER_SITE_OTHER): Payer: Self-pay | Admitting: Internal Medicine

## 2011-07-29 ENCOUNTER — Encounter: Payer: Self-pay | Admitting: Internal Medicine

## 2011-07-29 ENCOUNTER — Ambulatory Visit (HOSPITAL_COMMUNITY)
Admission: RE | Admit: 2011-07-29 | Discharge: 2011-07-29 | Disposition: A | Discharge status: Home / Self Care | Payer: Self-pay | Source: Ambulatory Visit | Attending: Internal Medicine | Admitting: Internal Medicine

## 2011-07-29 VITALS — BP 135/91 | HR 87 | Temp 97.2°F | Wt 296.6 lb

## 2011-07-29 DIAGNOSIS — M25561 Pain in right knee: Secondary | ICD-10-CM

## 2011-07-29 DIAGNOSIS — M25569 Pain in unspecified knee: Secondary | ICD-10-CM | POA: Insufficient documentation

## 2011-07-29 DIAGNOSIS — M7989 Other specified soft tissue disorders: Secondary | ICD-10-CM

## 2011-07-29 DIAGNOSIS — B20 Human immunodeficiency virus [HIV] disease: Secondary | ICD-10-CM

## 2011-07-29 DIAGNOSIS — M79604 Pain in right leg: Secondary | ICD-10-CM | POA: Insufficient documentation

## 2011-07-29 DIAGNOSIS — G609 Hereditary and idiopathic neuropathy, unspecified: Secondary | ICD-10-CM

## 2011-07-29 DIAGNOSIS — I10 Essential (primary) hypertension: Secondary | ICD-10-CM

## 2011-07-29 MED ORDER — MELOXICAM 15 MG PO TABS: 15.0000 mg | ORAL_TABLET | Freq: Every day | ORAL | Status: DC

## 2011-07-29 MED ORDER — LOSARTAN POTASSIUM 50 MG PO TABS: 50.0000 mg | ORAL_TABLET | Freq: Every day | ORAL | Status: DC

## 2011-07-29 NOTE — Patient Instructions (Signed)
-  Apply ice pad for at least 20 minutes to your right knee at least 3 times daily -Take Mobic 15 mg one tablet daily x2 weeks -Get x-ray of your knees and I will call you with results -Your venous Doppler of your legs did not show any blood clots -Stop taking Norvasc time 1 weeks to see if your legs symptoms improved/resolved -You may start taking Losartan 50mg  one tablet daily for your blood pressure -Continue taking other medications as prescribed -Followup with Dr. Anselm Jungling in 2-3 weeks if his symptoms do not improve, otherwise followup in 3-4 months.

## 2011-07-29 NOTE — Progress Notes (Signed)
*  PRELIMINARY RESULTS* Vascular Ultrasound Lower Extremity Venous Duplex has been completed.  Preliminary findings: Bilaterally no evidence deep vein thrombus or Baker's cyst.  Farrel Demark RDMS 07/29/2011, 4:14 PM

## 2011-07-29 NOTE — Progress Notes (Signed)
HPI: Anita Pacheco is a 41 yo woman with PMH of HIV, anxiety, peripheral neuropathy presents today for right knee pain x 5 days s/p mechanical fall when the floor was wet.  She has been applying ice but reports without much improvement.  She has also been taking 6-8 extra strength Tylenol daily for the pain which helps "ease off" her pain.  She does not want to take other NSAIDs because she afraid it would hurt her kidney and that "she does not want to be on dialysis".  She also complains of right leg swelling/pain/numbness and tingling in past 3 months after taking Norvasc.  She states that when she didn't take it for 4 days when she was in [place] visiting her sister, she felt that her right leg felt better; therefore, she is attributing it to Norvasc.  She was prescribed Lyrica but does not take it on a daily basis because it makes her feel drowsy.  She was in a car ride to [place] for "4.5 hours" only.  She denies any past personal or family history of blood clots.  She is extremely anxious and does not want to take more medications.  She denies any fever, chills, chest pain, or SOB.    ROS: as per HPI  PE: General: alert, well-developed, and cooperative to examination.  Lungs: normal respiratory effort, no accessory muscle use, normal breath sounds, no crackles, and no wheezes. Heart: normal rate, regular rhythm, no murmur, no gallop, and no rub.  Abdomen: soft, non-tender, normal bowel sounds, no distention, no guarding, no rebound tenderness Pulses: 2+ DP/PT pulses bilaterally Extremities: R knee: tenderness to palpation of medial aspect, no erythema, or increased in warm or effusion noted.  She does have limited ROM of right knee 2/2 pain.  Right LE: nonpitting edema with tenderness to palpation of anterior and posterior aspect.  Noticeable varicose veins on LE.   Neurologic: nonfocal Psych: Oriented X3, memory intact for recent and remote, normally interactive, good eye contact,+ very anxious  appearing

## 2011-07-29 NOTE — Assessment & Plan Note (Signed)
Continue Lyrica. 

## 2011-07-29 NOTE — Assessment & Plan Note (Addendum)
Venous insufficiency vs. DVT given her prolonged car ride. -Will get Venous Duplex of LE today-negative for DVT or Baker's cyst -Leg elevation above heart level to decrease swelling

## 2011-07-29 NOTE — Assessment & Plan Note (Addendum)
Adequately controlled with Norvasc; however, patient is reporting side effects of numbness, swelling, and tenderness after starting Norvasc about 3 months ago.  She has multiple intolerance to ACEi, Maxides.  We discussed the options of hydralazine and Clonidine but she does not want to start those meds because she does not to take multiple times daily and Clonidine might give her headache. -Will stop Norvasc for 1 week to see if improvement -Will start Losartan 50mg  po qd which will also give her some protection of the kidneys that she is not able to tolerate ACEi -f/u in 2-4 wks

## 2011-07-29 NOTE — Assessment & Plan Note (Addendum)
S/p mechanical fall. -Get right Knee Xray today and I will call patient with abnormal results -Ice pad 20 mins at least 3 times daily -may need to refer to orthopedics depending on her results -May need anti-inflammatory agent to help calm down the inflammation.  I prescribed Mobic 15 mg by mouth daily x2 weeks for anti-inflammation even that her creatinines have been within normal limit

## 2011-07-29 NOTE — Assessment & Plan Note (Signed)
Stable. Patient reports medication compliance. She is being followed by Dr. Ninetta Lights.

## 2011-08-08 ENCOUNTER — Ambulatory Visit
Admission: RE | Admit: 2011-08-08 | Discharge: 2011-08-08 | Disposition: A | Discharge status: Home / Self Care | Payer: Self-pay | Source: Ambulatory Visit | Attending: Family Medicine | Admitting: Family Medicine

## 2011-08-08 DIAGNOSIS — R599 Enlarged lymph nodes, unspecified: Secondary | ICD-10-CM

## 2011-08-29 ENCOUNTER — Ambulatory Visit (INDEPENDENT_AMBULATORY_CARE_PROVIDER_SITE_OTHER): Payer: Self-pay | Admitting: Internal Medicine

## 2011-08-29 ENCOUNTER — Encounter: Payer: Self-pay | Admitting: Internal Medicine

## 2011-08-29 DIAGNOSIS — M7989 Other specified soft tissue disorders: Secondary | ICD-10-CM

## 2011-08-29 DIAGNOSIS — I10 Essential (primary) hypertension: Secondary | ICD-10-CM

## 2011-08-29 DIAGNOSIS — J329 Chronic sinusitis, unspecified: Secondary | ICD-10-CM

## 2011-08-29 DIAGNOSIS — F411 Generalized anxiety disorder: Secondary | ICD-10-CM

## 2011-08-29 MED ORDER — DICLOFENAC SODIUM 1 % TD GEL: 1.0000 "application " | Freq: Four times a day (QID) | TRANSDERMAL | Status: DC

## 2011-08-29 MED ORDER — LOSARTAN POTASSIUM 50 MG PO TABS: 50.0000 mg | ORAL_TABLET | Freq: Every day | ORAL | Status: DC

## 2011-08-29 MED ORDER — AMOXICILLIN-POT CLAVULANATE 875-125 MG PO TABS: 1.0000 | ORAL_TABLET | Freq: Two times a day (BID) | ORAL | Status: AC

## 2011-08-29 NOTE — Progress Notes (Signed)
HPI: [name] is a 41 yo W with PMH of HIV, HTN, anxiety, sinusitis presents today for follow up on her right knee pain/swelling s/p a mechanical fall.  She had both Xray and LE doppler done during last visit.  She states that right knee pain still persists however the swelling is improved.  She said that the Mobic did help the pain but sometimes she has to take the Aleve as well.  She does not want to be referred to sport med/orthopedics and does not really want to take more medication for this problem.  She is going to try some aerobic water exercise and loose weight because she knows it is a lot of pressure on her knees. She has a steady headache in the past few days, frontal and maxillary in location, radiating to her ears, has nosebleeds bilaterally lasting for about 40 minutes.  Also has yellow-greenish sputum.  She denies fever. The put pressure on it to stop the bleeding.  She had 3 episodes this week. Her niece has a cold so she started sneezing, post nasal drip.  She has been taking flonase, netty pot, cough syrup, salt water gargling which helps.   As for her BP, she is tolerating the Losartan pretty well without any side effects.  ROS: as per HPI  PE: General: alert, well-developed, and cooperative to examination.  Head: tenderness to palpation of frontal and maxillary sinuses Ears: R ear: tube in placed.  Left ear: normal TM Nose: very erythematous nasal mucosa with mild purulent drainage Mouth: pharynx pink and moist, no erythema, and no exudates however, I did noticed a hard white spot on her right tonsil about 3mm in size.  Lungs: normal respiratory effort, no accessory muscle use, normal breath sounds, no crackles, and no wheezes. Heart: normal rate, regular rhythm, no murmur, no gallop, and no rub.  Abdomen: soft, non-tender, normal bowel sounds, no distention, no guarding, no rebound tenderness Extremities: No cyanosis, clubbing, ++non pitting edema of the right LE but  significant improvement.  ROM of R LE also improved. Neurologic: nonfocal Skin: turgor normal and no rashes.  Psych: Oriented X3, memory intact for recent and remote, normally interactive, good eye contact, very anxious appearing, and not depressed appearing.

## 2011-08-29 NOTE — Assessment & Plan Note (Addendum)
Significant improvement from last office visit.  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  Patient does not want to take more meds nor does she wants to be referred to sport medicine/orthopedics. -will try topival voltaren gel qid, if no improvement, can try capsaicin cream next time. -She will try aerobic water exercise to help loose weight which can be a burden on her knees -Continue leg elevation

## 2011-08-29 NOTE — Assessment & Plan Note (Signed)
Again, she declines SSRI at this time

## 2011-08-29 NOTE — Assessment & Plan Note (Signed)
She has tenderness to palpation of the frontal and maxillary sinuses along with purulent nasal discharge -Will prescribe augmentin 875mg  po bid x 2 weeks -Continue Flonase and Zyrtec/Xyzal -She is also being followed by ENT

## 2011-08-29 NOTE — Assessment & Plan Note (Signed)
BP 148/95--better but not well controlled.  She does tolerate ARB pretty well without any side effects. -Will continue this current dose and if still elevated, will increase it at next office visit

## 2011-08-29 NOTE — Patient Instructions (Signed)
Start taking Augmentin 875mg  one tablet twice daily Start Voltaren 1% gel, apply to your right knee four times daily Continue your Losartan 50mg  one tablet daily Continue to elevate your legs at least 3-4 times daily (30-45 minutes each) You can apply heating/ice pads on your right knees Follow up with Dr. Anselm Jungling in 2-3 months

## 2011-09-01 ENCOUNTER — Ambulatory Visit: Payer: Self-pay

## 2011-09-10 ENCOUNTER — Ambulatory Visit: Payer: Self-pay

## 2011-09-17 ENCOUNTER — Ambulatory Visit: Payer: Self-pay

## 2011-10-06 ENCOUNTER — Telehealth: Payer: Self-pay | Admitting: Licensed Clinical Social Worker

## 2011-10-06 NOTE — Telephone Encounter (Signed)
Patient will have a lapse in ADAP coverage and may be off medications for up to 3 weeks. She was worried how that would effect her labs. I advised her that her counts may drop some but she should be ok if it's only 3 weeks. Her last labs in December she had a cd 4 of 390 and viral load of 33.

## 2011-10-10 ENCOUNTER — Ambulatory Visit: Payer: Self-pay

## 2011-10-20 ENCOUNTER — Ambulatory Visit: Payer: Self-pay

## 2011-11-05 ENCOUNTER — Other Ambulatory Visit: Payer: Self-pay

## 2011-11-05 ENCOUNTER — Ambulatory Visit: Payer: Self-pay

## 2011-11-05 ENCOUNTER — Other Ambulatory Visit: Payer: Self-pay | Admitting: Infectious Diseases

## 2011-11-05 DIAGNOSIS — B20 Human immunodeficiency virus [HIV] disease: Secondary | ICD-10-CM

## 2011-11-05 DIAGNOSIS — Z113 Encounter for screening for infections with a predominantly sexual mode of transmission: Secondary | ICD-10-CM

## 2011-11-05 DIAGNOSIS — Z79899 Other long term (current) drug therapy: Secondary | ICD-10-CM

## 2011-11-05 LAB — COMPLETE METABOLIC PANEL WITH GFR
BUN: 15 mg/dL (ref 6–23)
CO2: 25 mEq/L (ref 19–32)
Calcium: 8.6 mg/dL (ref 8.4–10.5)
Chloride: 105 mEq/L (ref 96–112)
Creat: 0.71 mg/dL (ref 0.50–1.10)
GFR, Est African American: >89 mL/min
GFR, Est Non African American: >89 mL/min
Total Bilirubin: 0.3 mg/dL (ref 0.3–1.2)

## 2011-11-05 LAB — CBC
HCT: 34.2 % — ABNORMAL LOW (ref 36.0–46.0)
MCH: 25.9 pg — ABNORMAL LOW (ref 26.0–34.0)
MCV: 80.7 fL (ref 78.0–100.0)
RBC: 4.24 MIL/uL (ref 3.87–5.11)
WBC: 3.3 10*3/uL — ABNORMAL LOW (ref 4.0–10.5)

## 2011-11-05 LAB — LIPID PANEL
Cholesterol: 137 mg/dL (ref 0–200)
HDL: 31 mg/dL — ABNORMAL LOW (ref >39)
LDL Cholesterol: 75 mg/dL (ref 0–99)
Triglycerides: 154 mg/dL — ABNORMAL HIGH (ref <150)

## 2011-11-06 LAB — T-HELPER CELL (CD4) - (RCID CLINIC ONLY): CD4 % Helper T Cell: 23 % — ABNORMAL LOW (ref 33–55)

## 2011-11-17 ENCOUNTER — Other Ambulatory Visit: Payer: Self-pay | Admitting: Infectious Diseases

## 2011-11-17 DIAGNOSIS — B20 Human immunodeficiency virus [HIV] disease: Secondary | ICD-10-CM

## 2011-11-18 ENCOUNTER — Telehealth: Payer: Self-pay | Admitting: *Deleted

## 2011-11-18 LAB — HIV-1 GENOTYPR PLUS

## 2011-11-18 NOTE — Telephone Encounter (Signed)
Called patient and left voice mail to remind her of appt tomorrow with Dr. Ninetta Lights. Wendall Mola

## 2011-11-19 ENCOUNTER — Encounter: Payer: Self-pay | Admitting: Infectious Diseases

## 2011-11-19 ENCOUNTER — Ambulatory Visit (INDEPENDENT_AMBULATORY_CARE_PROVIDER_SITE_OTHER): Payer: Self-pay | Admitting: Infectious Diseases

## 2011-11-19 VITALS — BP 155/97 | HR 83 | Temp 98.1°F | Ht 67.0 in | Wt 301.0 lb

## 2011-11-19 DIAGNOSIS — I10 Essential (primary) hypertension: Secondary | ICD-10-CM

## 2011-11-19 DIAGNOSIS — H9201 Otalgia, right ear: Secondary | ICD-10-CM

## 2011-11-19 DIAGNOSIS — B20 Human immunodeficiency virus [HIV] disease: Secondary | ICD-10-CM

## 2011-11-19 NOTE — Progress Notes (Signed)
  Subjective:    Patient ID: Anita Pacheco, female    DOB: 1971/02/10, 41 y.o.   MRN: [id number]  HPI 41 yo F with HIV+, previously started on complera, HTN and anxiety. Has been seen by ENT and had tubes put in. Missed her meds for 1 month due to financial lapse.   Has been taking anti-htn rx and complera. States she has been using her inhaler- albuterol- 1 every 2 weeks.   HIV 1 RNA Quant (copies/mL)  Date Value  11/05/2011 1379*  06/18/2011 33*  04/10/2011 33100*     CD4 T Cell Abs (cmm)  Date Value  11/05/2011 350*  06/18/2011 390*  04/10/2011 260*      Review of Systems  Constitutional: Negative for appetite change and unexpected weight change.  Respiratory: Negative for cough and shortness of breath.   Cardiovascular: Negative for chest pain.  Genitourinary: Negative for dysuria.  Neurological: Positive for headaches.       Objective:   Physical Exam  Constitutional: She appears well-developed and well-nourished.  Eyes: EOM are normal. Pupils are equal, round, and reactive to light.  Neck: Neck supple.  Cardiovascular: Normal rate, regular rhythm and normal heart sounds.   Pulmonary/Chest: Effort normal and breath sounds normal. No respiratory distress.  Abdominal: Soft. Bowel sounds are normal.  Lymphadenopathy:    She has no cervical adenopathy.          Assessment & Plan:

## 2011-11-19 NOTE — Assessment & Plan Note (Signed)
I looked into her ear and saw what appears to be a tube which has come out (at least partially). I asked her to f/u with the ENT who put this in (she will do this).

## 2011-11-19 NOTE — Assessment & Plan Note (Signed)
Greatly appreciate Dr Anselm Jungling partnering with [place]. She is asx (has headaches which she attributes to work stress) but BP is increased today.

## 2011-11-19 NOTE — Assessment & Plan Note (Signed)
Pap is due 01-2012. She is urged to call the clinic if she is out of meds so that we can assist her. Hopefully she has not developed resistance. She is offered/refused condoms. Will see her back in 4 months.

## 2012-03-10 ENCOUNTER — Other Ambulatory Visit: Payer: Self-pay

## 2012-03-10 ENCOUNTER — Ambulatory Visit: Payer: Self-pay

## 2012-03-10 ENCOUNTER — Ambulatory Visit (INDEPENDENT_AMBULATORY_CARE_PROVIDER_SITE_OTHER): Payer: Self-pay | Admitting: *Deleted

## 2012-03-10 DIAGNOSIS — Z23 Encounter for immunization: Secondary | ICD-10-CM

## 2012-03-10 DIAGNOSIS — B20 Human immunodeficiency virus [HIV] disease: Secondary | ICD-10-CM

## 2012-03-10 DIAGNOSIS — Z124 Encounter for screening for malignant neoplasm of cervix: Secondary | ICD-10-CM

## 2012-03-10 NOTE — Progress Notes (Signed)
  Subjective:     Anita Pacheco is a 41 y.o. woman who comes in today for a  pap smear only.  Previous abnormal Pap smears:  no. Contraception: abstinent  Objective:    There were no vitals taken for this visit. Pelvic Exam: Pap smear obtained.   Assessment:    Screening pap smear.   Plan:   Your next PAP smear will be in one year, or as indicated by Pap results.  Pt given educational materials re: HIV and women, heart disease, nutrition, diet, exercise, PAP smears, self-esteem, and partner protection. BSE card given.  Pt completed application for the [facility]

## 2012-03-10 NOTE — Patient Instructions (Addendum)
  Your results will be ready in about a week.  Thank you for coming to the Center for your care.  Angelique Blonder, RN

## 2012-03-11 LAB — CBC
HCT: 31.8 % — ABNORMAL LOW (ref 36.0–46.0)
Hemoglobin: 9.8 g/dL — ABNORMAL LOW (ref 12.0–15.0)
MCHC: 30.8 g/dL (ref 30.0–36.0)
MCV: 79.5 fL (ref 78.0–100.0)

## 2012-03-11 LAB — COMPLETE METABOLIC PANEL WITH GFR
Albumin: 3.5 g/dL (ref 3.5–5.2)
Alkaline Phosphatase: 64 U/L (ref 39–117)
BUN: 12 mg/dL (ref 6–23)
Calcium: 8.8 mg/dL (ref 8.4–10.5)
Creat: 0.88 mg/dL (ref 0.50–1.10)
GFR, Est Non African American: 82 mL/min
Glucose, Bld: 126 mg/dL — ABNORMAL HIGH (ref 70–99)
Potassium: 3.4 mEq/L — ABNORMAL LOW (ref 3.5–5.3)

## 2012-03-11 LAB — T-HELPER CELL (CD4) - (RCID CLINIC ONLY): CD4 T Cell Abs: 360 uL — ABNORMAL LOW (ref 400–2700)

## 2012-03-14 LAB — HIV-1 RNA QUANT-NO REFLEX-BLD: HIV 1 RNA Quant: <20 copies/mL (ref <20)

## 2012-03-15 ENCOUNTER — Ambulatory Visit: Payer: Self-pay

## 2012-03-16 ENCOUNTER — Encounter: Payer: Self-pay | Admitting: *Deleted

## 2012-03-22 ENCOUNTER — Ambulatory Visit: Payer: Self-pay

## 2012-03-22 ENCOUNTER — Encounter: Payer: Self-pay | Admitting: Internal Medicine

## 2012-03-22 ENCOUNTER — Ambulatory Visit (INDEPENDENT_AMBULATORY_CARE_PROVIDER_SITE_OTHER): Payer: Self-pay | Admitting: Internal Medicine

## 2012-03-22 VITALS — BP 138/88 | HR 81 | Temp 98.6°F | Wt 297.9 lb

## 2012-03-22 DIAGNOSIS — I10 Essential (primary) hypertension: Secondary | ICD-10-CM

## 2012-03-22 DIAGNOSIS — F4320 Adjustment disorder, unspecified: Secondary | ICD-10-CM

## 2012-03-22 DIAGNOSIS — J309 Allergic rhinitis, unspecified: Secondary | ICD-10-CM

## 2012-03-22 MED ORDER — LEVOCETIRIZINE DIHYDROCHLORIDE 5 MG PO TABS: 5.0000 mg | ORAL_TABLET | Freq: Every evening | ORAL | Status: DC

## 2012-03-22 MED ORDER — FLUTICASONE PROPIONATE 50 MCG/ACT NA SUSP: NASAL | Status: DC

## 2012-03-22 MED ORDER — LOSARTAN POTASSIUM 50 MG PO TABS: 50.0000 mg | ORAL_TABLET | Freq: Every day | ORAL | Status: DC

## 2012-03-22 NOTE — Assessment & Plan Note (Signed)
BP is better today. Repeat BP is 138/88 -Will continue Losartan 50mg  po qd

## 2012-03-22 NOTE — Patient Instructions (Addendum)
Continue taking your medications as prescribed I have sent refills of Losartan 50mg  one tablet daily and Zyxal 5mg  one tablet daily to [facility] Follow up with Dr. Anselm Jungling in 6 months

## 2012-03-22 NOTE — Progress Notes (Signed)
I met with Anita Pacheco for the first time today and she was pleasant and cooperative.  She talked about how she gets frustrated with people.  She is especially frustrated with her eye doctor and dentist, both of who recommended she find new providers after she accused them of violating her privacy.  She seems to be very suspicious of people and untrusting (paranoid?).  She has a habit of chuckling to herself as she talks about things.  She reports no problem with mood or appetite, but does say that she doesn't have enough time to sleep because of her busy schedule.  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  She is currently working on a nursing degree at [employer].  Plan to meet again in 1-2 weeks.

## 2012-03-22 NOTE — Progress Notes (Signed)
HPI: [name] is a 41 yo woman with PMH of HIV with CD4 360 and VL <20 copies on 03/10/12, anxiety, HTN, presents today for medication refills. She ran out of Losartan since Saturday.  She has not seen ENT since she saw Dr. Ninetta Lights because she has been very busy.  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  She reports medication compliance with her HIV med.  She had her flu shot and pap smear at ID clinic. She also had Pneumovax  In the past as well.  ROS: as per HPI  PE: General: alert, well-developed, and cooperative to examination.  Ears: right ear: tube is still in her ear canal, left: TM unremarkable  Lungs: normal respiratory effort, no accessory muscle use, normal breath sounds, no crackles, and no wheezes. Heart: normal rate, regular rhythm, no murmur, no gallop, and no rub.  Abdomen: soft, non-tender, normal bowel sounds, no distention, no guarding, no rebound tenderness Msk: no joint swelling, no joint warmth, and no redness over joints.  Pulses: 2+ DP/PT pulses bilaterally Neurologic: alert & oriented X3, cranial nerves II-XII intact, strength normal in all extremities, sensation intact to light touch, and gait normal.  Skin: turgor normal and no rashes.  Psych: Oriented X3, memory intact for recent and remote, normally interactive, good eye contact, +anxious appearing, and+depressed appearing.

## 2012-03-22 NOTE — Assessment & Plan Note (Signed)
Doing well with Zyxal. -Will refill Zyxal

## 2012-03-24 ENCOUNTER — Ambulatory Visit (INDEPENDENT_AMBULATORY_CARE_PROVIDER_SITE_OTHER): Payer: Self-pay | Admitting: Infectious Diseases

## 2012-03-24 ENCOUNTER — Encounter: Payer: Self-pay | Admitting: Infectious Diseases

## 2012-03-24 VITALS — BP 168/111 | HR 71 | Temp 98.3°F | Ht 67.5 in | Wt 296.0 lb

## 2012-03-24 DIAGNOSIS — Z79899 Other long term (current) drug therapy: Secondary | ICD-10-CM

## 2012-03-24 DIAGNOSIS — B20 Human immunodeficiency virus [HIV] disease: Secondary | ICD-10-CM

## 2012-03-24 DIAGNOSIS — F411 Generalized anxiety disorder: Secondary | ICD-10-CM

## 2012-03-24 DIAGNOSIS — Z113 Encounter for screening for infections with a predominantly sexual mode of transmission: Secondary | ICD-10-CM

## 2012-03-24 DIAGNOSIS — E669 Obesity, unspecified: Secondary | ICD-10-CM

## 2012-03-24 NOTE — Assessment & Plan Note (Signed)
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

## 2012-03-24 NOTE — Assessment & Plan Note (Signed)
Doing very well. Has gotten flushot. 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 She refuses condoms. Will see her back in 6 months.

## 2012-03-24 NOTE — Assessment & Plan Note (Signed)
Will need to continue to emphasize diet and exercise

## 2012-03-24 NOTE — Progress Notes (Signed)
  Subjective:    Patient ID: Anita Pacheco, female    DOB: 04-Jul-1970, 41 y.o.   MRN: [id number]  HPI 41 yo F with HIV+, previously started on complera, HTN and anxiety. Has been seen by ENT and had tubes put in. Today states that her ears are still bothering her, has not had time (from school)  To get back to ENT.  Has been taking losartan and complera.  No problems with ART. Has missed some doses of BP meds until changed to ARB.  Has been stressed today- had exam, couldn't sleep, had doctor's appt, child has doctor appt, has headache.....  HIV 1 RNA Quant (copies/mL)  Date Value  03/10/2012 <20   11/05/2011 1379*  06/18/2011 33*     CD4 T Cell Abs (cmm)  Date Value  03/10/2012 360*  11/05/2011 350*  06/18/2011 390*      Review of Systems  Constitutional: Negative for appetite change and unexpected weight change.  Gastrointestinal: Negative for diarrhea and constipation.  Genitourinary: Negative for dysuria.       Objective:   Physical Exam  Constitutional: She appears well-developed and well-nourished.  HENT:  Mouth/Throat: No oropharyngeal exudate.  Eyes: EOM are normal. Pupils are equal, round, and reactive to light.  Neck: Neck supple.  Cardiovascular: Normal rate, regular rhythm and normal heart sounds.   Pulmonary/Chest: Effort normal and breath sounds normal.  Abdominal: Soft. Bowel sounds are normal. She exhibits no distension.  Lymphadenopathy:    She has no cervical adenopathy.          Assessment & Plan:

## 2012-03-29 ENCOUNTER — Ambulatory Visit: Payer: Self-pay

## 2012-03-29 DIAGNOSIS — F4320 Adjustment disorder, unspecified: Secondary | ICD-10-CM
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
## 2012-04-05 ENCOUNTER — Ambulatory Visit: Payer: Self-pay

## 2012-04-05 DIAGNOSIS — F4320 Adjustment disorder, unspecified: Secondary | ICD-10-CM
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
## 2012-04-12 ENCOUNTER — Ambulatory Visit: Payer: Self-pay

## 2012-05-04 ENCOUNTER — Other Ambulatory Visit: Payer: Self-pay | Admitting: *Deleted

## 2012-05-04 DIAGNOSIS — B20 Human immunodeficiency virus [HIV] disease: Secondary | ICD-10-CM

## 2012-05-04 MED ORDER — EMTRICITAB-RILPIVIR-TENOFOV DF 200-25-300 MG PO TABS: 1.0000 | ORAL_TABLET | Freq: Every day | ORAL | Status: DC

## 2012-06-02 ENCOUNTER — Encounter (HOSPITAL_COMMUNITY): Payer: Self-pay | Admitting: *Deleted

## 2012-06-02 ENCOUNTER — Emergency Department (HOSPITAL_COMMUNITY)
Admission: EM | Admit: 2012-06-02 | Discharge: 2012-06-02 | Disposition: A | Discharge status: Home / Self Care | Payer: PRIVATE HEALTH INSURANCE | Attending: Emergency Medicine | Admitting: Emergency Medicine

## 2012-06-02 DIAGNOSIS — Z9889 Other specified postprocedural states: Secondary | ICD-10-CM | POA: Insufficient documentation

## 2012-06-02 DIAGNOSIS — Z79899 Other long term (current) drug therapy: Secondary | ICD-10-CM | POA: Insufficient documentation

## 2012-06-02 DIAGNOSIS — R040 Epistaxis: Secondary | ICD-10-CM

## 2012-06-02 DIAGNOSIS — I10 Essential (primary) hypertension: Secondary | ICD-10-CM | POA: Insufficient documentation

## 2012-06-02 DIAGNOSIS — Z7982 Long term (current) use of aspirin: Secondary | ICD-10-CM | POA: Insufficient documentation

## 2012-06-02 DIAGNOSIS — B20 Human immunodeficiency virus [HIV] disease: Secondary | ICD-10-CM | POA: Insufficient documentation

## 2012-06-02 DIAGNOSIS — J3489 Other specified disorders of nose and nasal sinuses: Secondary | ICD-10-CM | POA: Insufficient documentation

## 2012-06-02 HISTORY — DX: Essential (primary) hypertension: I10

## 2012-06-02 HISTORY — DX: Asymptomatic human immunodeficiency virus (hiv) infection status: Z21

## 2012-06-02 HISTORY — DX: Human immunodeficiency virus (HIV) disease: B20

## 2012-06-02 LAB — CBC WITH DIFFERENTIAL/PLATELET
Eosinophils Absolute: 0.1 10*3/uL (ref 0.0–0.7)
Eosinophils Relative: 1 % (ref 0–5)
HCT: 29.4 % — ABNORMAL LOW (ref 36.0–46.0)
Hemoglobin: 9.3 g/dL — ABNORMAL LOW (ref 12.0–15.0)
Lymphocytes Relative: 31 % (ref 12–46)
Lymphs Abs: 1.8 10*3/uL (ref 0.7–4.0)
MCH: 24.4 pg — ABNORMAL LOW (ref 26.0–34.0)
MCV: 77.2 fL — ABNORMAL LOW (ref 78.0–100.0)
Monocytes Relative: 9 % (ref 3–12)
Platelets: 244 10*3/uL (ref 150–400)
RBC: 3.81 MIL/uL — ABNORMAL LOW (ref 3.87–5.11)
WBC: 6 10*3/uL (ref 4.0–10.5)

## 2012-06-02 LAB — BASIC METABOLIC PANEL
BUN: 14 mg/dL (ref 6–23)
CO2: 22 mEq/L (ref 19–32)
Calcium: 8.8 mg/dL (ref 8.4–10.5)
GFR calc non Af Amer: 83 mL/min — ABNORMAL LOW (ref >90)
Glucose, Bld: 132 mg/dL — ABNORMAL HIGH (ref 70–99)
Sodium: 134 mEq/L — ABNORMAL LOW (ref 135–145)

## 2012-06-02 LAB — PROTIME-INR: INR: 1.02 (ref 0.00–1.49)

## 2012-06-02 MED ORDER — OXYMETAZOLINE HCL 0.05 % NA SOLN
1.0000 | Freq: Once | NASAL | Status: AC
  Administered 2012-06-02: 1 via NASAL
  Filled 2012-06-02: qty 15

## 2012-06-02 NOTE — ED Provider Notes (Signed)
Medical screening examination/treatment/procedure(s) were performed by non-physician practitioner and as supervising physician I was immediately available for consultation/collaboration.    Vida Roller, MD 06/02/12 [phone]

## 2012-06-02 NOTE — ED Notes (Addendum)
Pt with nose bleed since 1530 yesterday am.  Pt does take meds for htn, but denies taking blood thinners.   Did take an asa yesterday, but does not take on a regular basis.  Pt hypertensive at 190/106.

## 2012-06-02 NOTE — ED Provider Notes (Signed)
History     CSN: [id number]  Arrival date & time 06/02/12  4098   First MD Initiated Contact with Patient 06/02/12 [phone]      Chief Complaint  Patient presents with  . Epistaxis   HPI  History provided by the patient. Patient is a 41 year old female with history of hypertension and HIV who presents with complaints of continued left nosebleed. Patient reports having some nose bleeding around 3 PM yesterday afternoon. Patient was able to hold pressure which helped with bleeding and is stopped. She had some recurrent bleeding multiple times throughout the evening and again late last night after coughing. Patient has since been unable to stop the bleeding into this morning. She reports using tissue just off in her nose several times but bleeding continued. He feels some bleeding in the back of her throat. She denies any lightheadedness or near syncope. She denies having similar symptoms previously.     Past Medical History  Diagnosis Date  . Hypertension   . HIV (human immunodeficiency virus infection)     Past Surgical History  Procedure Date  . Stapedes surgery     No family history on file.  History  Substance Use Topics  . Smoking status: Never Smoker   . Smokeless tobacco: Never Used  . Alcohol Use: No    OB History    Grav Para Term Preterm Abortions TAB SAB Ect Mult Living                  Review of Systems  Constitutional: Negative for fever, chills and diaphoresis.  HENT: Positive for nosebleeds and congestion.   Respiratory: Negative for cough.   All other systems reviewed and are negative.    Allergies  Hydrocodone  Home Medications   Current Outpatient Rx  Name  Route  Sig  Dispense  Refill  . ALBUTEROL 90 MCG/ACT IN AERS   Inhalation   Inhale 1-2 puffs into the lungs every 4 (four) hours as needed. wheezing         . ASPIRIN 325 MG PO TABS   Oral   Take 325 mg by mouth daily. For arthritis pain         . DICLOFENAC SODIUM 1 % TD GEL  Topical   Apply 1 application topically 4 (four) times daily.   100 g   6   . EMTRICITAB-RILPIVIR-TENOFOVIR 200-25-300 MG PO TABS   Oral   Take 1 tablet by mouth daily at 10 pm.         . FLUTICASONE PROPIONATE 50 MCG/ACT NA SUSP      Please rinse your nose with netty pot before using flonase.  Spray into each nostril 2 sprays daily   16 g   6   . LEVOCETIRIZINE DIHYDROCHLORIDE 5 MG PO TABS   Oral   Take 1 tablet (5 mg total) by mouth every evening.   31 tablet   11   . LOSARTAN POTASSIUM 50 MG PO TABS   Oral   Take 1 tablet (50 mg total) by mouth daily.   90 tablet   4   . MULTI-VITAMIN/MINERALS PO TABS   Oral   Take 1 tablet by mouth daily.           Marland Kitchen PREGABALIN 50 MG PO CAPS   Oral   Take 50 mg by mouth every other day.         Marland Kitchen CETIRIZINE HCL 10 MG PO TABS   Oral   Take 1  tablet (10 mg total) by mouth daily.   30 tablet   11   . LEVOCETIRIZINE DIHYDROCHLORIDE 5 MG PO TABS   Oral   Take 1 tablet (5 mg total) by mouth every evening.   30 tablet   0     BP 149/88  Pulse 102  Temp 98.7 F (37.1 C) (Oral)  SpO2 99%  Physical Exam  Nursing note and vitals reviewed. Constitutional: She is oriented to person, place, and time. She appears well-developed and well-nourished. No distress.  HENT:  Head: Normocephalic.  Mouth/Throat: Oropharynx is clear and moist.       Left nostril epistaxis  Neck: Normal range of motion. Neck supple.  Cardiovascular: Normal rate and regular rhythm.   Pulmonary/Chest: Effort normal and breath sounds normal.  Neurological: She is alert and oriented to person, place, and time.  Skin: Skin is warm and dry.  Psychiatric: She has a normal mood and affect. Her behavior is normal.    ED Course  Procedures   Results for orders placed during the hospital encounter of 06/02/12  CBC WITH DIFFERENTIAL      Component Value Range   WBC 6.0  4.0 - 10.5 K/uL   RBC 3.81 (*) 3.87 - 5.11 MIL/uL   Hemoglobin 9.3 (*) 12.0 -  15.0 g/dL   HCT 40.9 (*) 81.1 - 91.4 %   MCV 77.2 (*) 78.0 - 100.0 fL   MCH 24.4 (*) 26.0 - 34.0 pg   MCHC 31.6  30.0 - 36.0 g/dL   RDW 78.2 (*) 95.6 - 21.3 %   Platelets 244  150 - 400 K/uL   Neutrophils Relative 59  43 - 77 %   Neutro Abs 3.5  1.7 - 7.7 K/uL   Lymphocytes Relative 31  12 - 46 %   Lymphs Abs 1.8  0.7 - 4.0 K/uL   Monocytes Relative 9  3 - 12 %   Monocytes Absolute 0.5  0.1 - 1.0 K/uL   Eosinophils Relative 1  0 - 5 %   Eosinophils Absolute 0.1  0.0 - 0.7 K/uL   Basophils Relative 0  0 - 1 %   Basophils Absolute 0.0  0.0 - 0.1 K/uL  PROTIME-INR      Component Value Range   Prothrombin Time 13.3  11.6 - 15.2 seconds   INR 1.02  0.00 - 1.49       1. Epistaxis       MDM  4:00 AM patient seen and evaluated. Patient in no acute distress.   Patient encouraged to blow out all the blood clots. She has held pressure for 15 minutes. Afrin was also applied. She momentarily he appears to have good control of epistaxis. We'll continue to monitor.   Patient has continued to do well without rebleeding. At this time we'll discharge home. Patient has seen Dr. Pollyann Kennedy with ENT in the past. Patient struck to call office for close followup appointment if bleeding continues.  Angus Seller, [place] 06/02/12 [phone]

## 2012-06-03 ENCOUNTER — Ambulatory Visit: Payer: PRIVATE HEALTH INSURANCE | Admitting: Internal Medicine

## 2012-06-08 ENCOUNTER — Encounter: Payer: Self-pay | Admitting: Internal Medicine

## 2012-06-08 ENCOUNTER — Ambulatory Visit (INDEPENDENT_AMBULATORY_CARE_PROVIDER_SITE_OTHER): Payer: PRIVATE HEALTH INSURANCE | Admitting: Internal Medicine

## 2012-06-08 VITALS — BP 143/98 | HR 77 | Temp 97.0°F | Wt 302.4 lb

## 2012-06-08 DIAGNOSIS — I10 Essential (primary) hypertension: Secondary | ICD-10-CM

## 2012-06-08 DIAGNOSIS — D509 Iron deficiency anemia, unspecified: Secondary | ICD-10-CM

## 2012-06-08 DIAGNOSIS — R040 Epistaxis: Secondary | ICD-10-CM | POA: Insufficient documentation

## 2012-06-08 MED ORDER — SODIUM CHLORIDE 0.65 % NA SOLN: 1.0000 | NASAL | Status: AC | PRN

## 2012-06-08 MED ORDER — LOSARTAN POTASSIUM 100 MG PO TABS: 100.0000 mg | ORAL_TABLET | Freq: Every day | ORAL | Status: DC

## 2012-06-08 MED ORDER — LOSARTAN POTASSIUM 50 MG PO TABS: 100.0000 mg | ORAL_TABLET | Freq: Every day | ORAL | Status: DC

## 2012-06-08 NOTE — Assessment & Plan Note (Addendum)
Pertinent Data:  Patient has persistent menorrhagia 7-10 long cycles using 5-7 pads daily.   Screening mammogram (07/2011) - BIRADS 1 (negative)  PAP (02/2012) - negative for intraepithelial lesions.  H/H Trends:  Ref. Range 04/10/2011  06/18/2011  11/05/2011  03/10/2012  06/02/2012   Hgb Latest Range: 12.0-15.0 g/dL 16.1 (L) 09.6 (L) 04.5 (L) 9.8 (L) 9.3 (L)  HCT Latest Range: 36.0-46.0 % 36.0 34.0 (L) 34.2 (L) 31.8 (L) 29.4 (L)  MCV Latest Range: 78.0-100.0 fL 83.3 83.7 80.7 79.5 77.2 (L)   Assessment: Patient's microcytic anemia suggests iron-deficiency anemia. She was on her menstrual cycle when the labwork was obtained on 12/3, which likely explains her worsening anemia. Do not see an anemia panel in the records. Patient denies recent blood in stools. Her bleeding from her epistaxis has stabilized. May additionally have a chronic disease component of her anemia contributed by her HIV.  Plan:      Will check anemia panel today, may benefit from addition of iron supplementation (dependent upon result).

## 2012-06-08 NOTE — Assessment & Plan Note (Signed)
Pertinent Data: BP Readings from Last 3 Encounters:  06/08/12 143/98  06/02/12 156/101  03/24/12 168/111    Basic Metabolic Panel:    Component Value Date/Time   NA 134* 06/02/2012 0349   K 3.4* 06/02/2012 0349   CL 102 06/02/2012 0349   CO2 22 06/02/2012 0349   BUN 14 06/02/2012 0349   CREATININE 0.85 06/02/2012 0349   CREATININE 0.88 03/10/2012 1524   GLUCOSE 132* 06/02/2012 0349   CALCIUM 8.8 06/02/2012 0349    Assessment: Disease Control: mildly elevated  Progress toward goals: unchanged  Barriers to meeting goals: no barriers identified    Patient is compliant most of the time with prescribed medications.   Plan:  Will increase her Losartan to 100mg  daily.  Educational resources provided: brochure  Self management tools provided: home blood pressure logbook

## 2012-06-08 NOTE — Progress Notes (Signed)
Patient: Anita Pacheco   MRN: [id number]  DOB: [birth year]    Subjective:    HPI: [name] is a 41 y.o. female with a PMHx of HIV (VL < 20, CD4 count 360), HTN, asthma who presented to clinic today for the following:  1) Nose bleed - patient is here for an ER follow-up for nose bleed. Patient indicates that since 12/03, she has had recurrent nose bleeds from her left nares, occuring once every few days. She was seen in the ER on 06/02/2012, was treated with Afrin, which temporarily stabilized the bleeding. Patient notes that on Saturday and Sunday, has had 2 further episodes, one of which she brought up some blood clots. Episodes frequently seem to be provoked by prolonged standing, being active, or after blowing her nose. This AM, felt it coming on, therefore, has attempted not to blow her nose. Has not previously had similar episodes. Denies history of bleeding disorders, unintentional weight loss, unexplained facial fullness, no eye tearing. No recent head or ear trauma or accidents.  2) HTN - Patient does not check blood pressure regularly at home. Currently taking losartan 50mg  daily without missing doses. Patient misses doses 0 x per week on average. denies headaches, dizziness, lightheadedness, chest pain, shortness of breath.  does request refills today.    Review of Systems: Per HPI.   Current Outpatient Medications: Medication Sig  . albuterol (PROVENTIL,VENTOLIN) 90 MCG/ACT inhaler Inhale 1-2 puffs into the lungs every 4 (four) hours as needed. wheezing  . aspirin 325 MG tablet Take 325 mg by mouth daily. For arthritis pain  . cetirizine (ZYRTEC) 10 MG tablet Take 1 tablet (10 mg total) by mouth daily.  . diclofenac sodium (VOLTAREN) 1 % GEL Apply 1 application topically 4 (four) times daily.  . Emtricitab-Rilpivir-Tenofovir (COMPLERA) 200-25-300 MG TABS Take 1 tablet by mouth daily at 10 pm.  . fluticasone (FLONASE) 50 MCG/ACT nasal spray Please rinse your nose with  netty pot before using flonase.  Spray into each nostril 2 sprays daily  . levocetirizine (XYZAL) 5 MG tablet Take 1 tablet (5 mg total) by mouth every evening.  Marland Kitchen levocetirizine (XYZAL) 5 MG tablet Take 1 tablet (5 mg total) by mouth every evening.  Marland Kitchen losartan (COZAAR) 50 MG tablet Take 1 tablet (50 mg total) by mouth daily.  . Multiple Vitamins-Minerals (MULTIVITAMIN WITH MINERALS) tablet Take 1 tablet by mouth daily.    . pregabalin (LYRICA) 50 MG capsule Take 50 mg by mouth every other day.     Allergies  Allergen Reactions  . Hydrocodone Hives and Itching    Past Medical History  Diagnosis Date  . Hypertension   . HIV (human immunodeficiency virus infection)   . ASTHMA 07/22/2006  . HYPERTENSION 07/22/2006  . PERIPHERAL NEUROPATHY 07/03/2008  . Carpal tunnel syndrome 07/22/2006  . ANXIETY 07/22/2006  . HERPES ZOSTER 05/28/2010  . Breast mass 04/25/2011    Past Surgical History  Procedure Date  . Stapedes surgery      Objective:    Physical Exam: Filed Vitals:   06/08/12 1628  BP: 143/98  Pulse: 77  Temp:       General: Vital signs reviewed and noted. Well-developed, well-nourished, in no acute distress; alert, appropriate and cooperative throughout examination.  Head: Normocephalic, atraumatic.  Eyes: conjunctivae/corneas clear. PERRL.  Ears: RT TM - nonerythematous, not bulging, good light reflex bilaterally.tube noted  LT TM - dark likely blood behind membrane. No appreciable perforations. No mastoid all ecchymosis.   Nose:  Mucous membranes moist, not inflammed, nonerythematous.  Throat: Oropharynx nonerythematous, no exudate appreciated.   Neck: No deformities, masses, or tenderness noted.  Lungs:  Normal respiratory effort. Clear to auscultation BL without crackles or wheezes.  Heart: RRR. S1 and S2 normal without gallop, rubs. No murmur.  Abdomen:  BS normoactive. Soft, Nondistended, non-tender.  No masses or organomegaly.  Extremities: No pretibial edema.    Neurologic: A&O X3, CN II - XII are grossly intact. Motor strength is 5/5 in the all 4 extremities, Sensations intact to light touch, Cerebellar signs negative.      Assessment/ Plan:   Case and plan of care discussed with attending physician, Dr. Debe Coder.

## 2012-06-08 NOTE — Patient Instructions (Signed)
General Instructions:  Please follow-up at the clinic in 1 month, at which time we will reevaluate your nose bleeding, chronic medical problems - OR, please follow-up in the clinic sooner if needed.  There have not been changes in your medications:  STOP flonase nasal spray.  START saline nasal spray.   See the ENT doctor as soon as you can. You are getting labs today, if they are abnormal I will give you a call.   If you have been started on new medication(s), and you develop symptoms concerning for allergic reaction, including, but not limited to, throat closing, tongue swelling, rash, please stop the medication immediately and call the clinic at [phone], and go to the ER.  If symptoms worsen, or new symptoms arise, please call the clinic or go to the ER.  PLEASE BRING ALL OF YOUR MEDICATIONS  IN A BAG TO YOUR NEXT APPOINTMENT   Treatment Goals:  Goals (1 Years of Data) as of 06/08/2012    None      Progress Toward Treatment Goals:  Treatment Goal 06/08/2012  Blood pressure unchanged    Self Care Goals & Plans:  Self Care Goal 06/08/2012  Manage my medications take my medicines as prescribed; bring my medications to every visit  Monitor my health keep track of my weight  Eat healthy foods eat smaller portions; eat foods that are low in salt; eat baked foods instead of fried foods  Be physically active find an activity I enjoy       Care Management & Community Referrals

## 2012-06-08 NOTE — Assessment & Plan Note (Signed)
Assessment: Patient has had recurrent epistaxis, likely contributed by dry of mucous membranes in setting of recent weather change, ongoing use of flonase (which is likely further drying out her membranes). No posterior or active bleed noted on exam. However, she does have likely old blood behind her left TM, which is of concern (states she felt this migration of the clot when she blew her nose forcefully).  Plan:      DC Flonase.  Start saline nasal spray when necessary.  Refer to ENT for continued evaluation, and consideration for fiberoptic evaluation (particularly in light of the blood behind left TM).  Patient advised that although she is normally resistant to seek care, if she is having worsening of the bleeding symptoms at home, if bleeding does not subside, she should present to the ER immediately for continued evaluation and management. Patient expressed understanding of this.

## 2012-06-09 ENCOUNTER — Other Ambulatory Visit: Payer: Self-pay | Admitting: Internal Medicine

## 2012-06-09 DIAGNOSIS — D509 Iron deficiency anemia, unspecified: Secondary | ICD-10-CM

## 2012-06-09 LAB — ANEMIA PANEL
%SAT: 5 % — ABNORMAL LOW (ref 20–55)
ABS Retic: 63.6 10*3/uL (ref 19.0–186.0)
Iron: 17 ug/dL — ABNORMAL LOW (ref 42–145)
Retic Ct Pct: 1.7 % (ref 0.4–2.3)
UIBC: 311 ug/dL (ref 125–400)

## 2012-06-09 MED ORDER — FERROUS SULFATE 325 (65 FE) MG PO TABS: 325.0000 mg | ORAL_TABLET | Freq: Three times a day (TID) | ORAL | Status: DC

## 2012-06-09 NOTE — Progress Notes (Signed)
Quick Note:  Patient has iron deficiency anemia, likely contributed by her menorrhagia. She will therefore need to start iron supplementation. Will call and inform. ______

## 2012-09-14 ENCOUNTER — Ambulatory Visit: Payer: PRIVATE HEALTH INSURANCE

## 2012-09-22 ENCOUNTER — Other Ambulatory Visit: Payer: Self-pay

## 2012-09-24 ENCOUNTER — Other Ambulatory Visit (INDEPENDENT_AMBULATORY_CARE_PROVIDER_SITE_OTHER): Payer: PRIVATE HEALTH INSURANCE

## 2012-09-24 DIAGNOSIS — Z113 Encounter for screening for infections with a predominantly sexual mode of transmission: Secondary | ICD-10-CM

## 2012-09-24 DIAGNOSIS — B20 Human immunodeficiency virus [HIV] disease: Secondary | ICD-10-CM

## 2012-09-24 DIAGNOSIS — Z79899 Other long term (current) drug therapy: Secondary | ICD-10-CM

## 2012-09-24 LAB — COMPREHENSIVE METABOLIC PANEL
Albumin: 3.6 g/dL (ref 3.5–5.2)
BUN: 11 mg/dL (ref 6–23)
CO2: 26 mEq/L (ref 19–32)
Calcium: 8.8 mg/dL (ref 8.4–10.5)
Chloride: 106 mEq/L (ref 96–112)
Glucose, Bld: 98 mg/dL (ref 70–99)
Potassium: 3.9 mEq/L (ref 3.5–5.3)
Sodium: 139 mEq/L (ref 135–145)
Total Protein: 7.1 g/dL (ref 6.0–8.3)

## 2012-09-24 LAB — CBC
HCT: 29.3 % — ABNORMAL LOW (ref 36.0–46.0)
Hemoglobin: 9.6 g/dL — ABNORMAL LOW (ref 12.0–15.0)
RBC: 4.16 MIL/uL (ref 3.87–5.11)

## 2012-09-24 LAB — LIPID PANEL
Cholesterol: 153 mg/dL (ref 0–200)
LDL Cholesterol: 99 mg/dL (ref 0–99)
Triglycerides: 76 mg/dL (ref <150)

## 2012-09-24 LAB — T-HELPER CELL (CD4) - (RCID CLINIC ONLY): CD4 % Helper T Cell: 30 % — ABNORMAL LOW (ref 33–55)

## 2012-09-27 LAB — HIV-1 RNA QUANT-NO REFLEX-BLD
HIV 1 RNA Quant: <20 copies/mL (ref <20)
HIV-1 RNA Quant, Log: <1.3 {Log} (ref <1.30)

## 2012-10-05 ENCOUNTER — Encounter: Payer: Self-pay | Admitting: Internal Medicine

## 2012-10-05 DIAGNOSIS — R269 Unspecified abnormalities of gait and mobility: Secondary | ICD-10-CM | POA: Insufficient documentation

## 2012-10-06 ENCOUNTER — Ambulatory Visit: Payer: Self-pay | Admitting: Infectious Diseases

## 2012-10-12 ENCOUNTER — Telehealth: Payer: Self-pay | Admitting: *Deleted

## 2012-10-12 ENCOUNTER — Ambulatory Visit: Payer: Self-pay | Admitting: Infectious Diseases

## 2012-10-12 NOTE — Telephone Encounter (Signed)
Called patient to notify of her no show. Patient thought her appointment was Wednesday, 10/13/12.  Patient scheduled for 10/19/12 at 10:15. Andree Coss, RN

## 2012-10-19 ENCOUNTER — Encounter: Payer: Self-pay | Admitting: Infectious Diseases

## 2012-10-19 ENCOUNTER — Ambulatory Visit (INDEPENDENT_AMBULATORY_CARE_PROVIDER_SITE_OTHER): Payer: PRIVATE HEALTH INSURANCE | Admitting: Infectious Diseases

## 2012-10-19 VITALS — BP 134/87 | HR 81 | Temp 98.2°F | Ht 67.0 in | Wt 304.0 lb

## 2012-10-19 DIAGNOSIS — Z9889 Other specified postprocedural states: Secondary | ICD-10-CM

## 2012-10-19 DIAGNOSIS — B20 Human immunodeficiency virus [HIV] disease: Secondary | ICD-10-CM

## 2012-10-19 DIAGNOSIS — E669 Obesity, unspecified: Secondary | ICD-10-CM

## 2012-10-19 DIAGNOSIS — I10 Essential (primary) hypertension: Secondary | ICD-10-CM

## 2012-10-19 DIAGNOSIS — Z9622 Myringotomy tube(s) status: Secondary | ICD-10-CM

## 2012-10-19 NOTE — Assessment & Plan Note (Signed)
Doing well, greatly appreciate IM f/u.

## 2012-10-19 NOTE — Assessment & Plan Note (Signed)
Encouraged her to watch diet and exercise.  

## 2012-10-19 NOTE — Progress Notes (Signed)
  Subjective:    Patient ID: Anita Pacheco, female    DOB: September 14, 1970, 42 y.o.   MRN: [id number]  HPI 42 yo F with HIV+, previously started on complera, HTN (on losartan) and anxiety. Has been seen by ENT and had tubes put in. Has been followed by IM. Wants to see Ophtho- thinks she needs new glasses- was dismissed by her previous ophtho Advertising account executive?). Has been trying to get ENT eval for f/u of her ear tubes.  Last PAP September 2013. Needs mammo.   HIV 1 RNA Quant (copies/mL)  Date Value  09/24/2012 <20   03/10/2012 <20   11/05/2011 1379*     CD4 T Cell Abs (cmm)  Date Value  09/24/2012 540   03/10/2012 360*  11/05/2011 350*     Review of Systems  Constitutional: Negative for appetite change and unexpected weight change.  Gastrointestinal: Negative for diarrhea and constipation.  Genitourinary: Negative for difficulty urinating and menstrual problem.  Neurological: Negative for headaches.       Objective:   Physical Exam  Constitutional: She appears well-developed and well-nourished.  HENT:  Mouth/Throat: No oropharyngeal exudate.  Eyes: EOM are normal. Pupils are equal, round, and reactive to light.  Neck: Neck supple.  Cardiovascular: Normal rate, regular rhythm and normal heart sounds.   Pulmonary/Chest: Effort normal and breath sounds normal.  Abdominal: Soft. Bowel sounds are normal. There is no tenderness.  Lymphadenopathy:    She has no cervical adenopathy.          Assessment & Plan:

## 2012-10-19 NOTE — Assessment & Plan Note (Addendum)
She is doing very well. vax are up to date. Taking complera with food. Offered/refused condoms. Will try to get her into ophtho. rtc 6 months

## 2013-02-16 ENCOUNTER — Ambulatory Visit: Payer: Self-pay

## 2013-04-06 ENCOUNTER — Other Ambulatory Visit (INDEPENDENT_AMBULATORY_CARE_PROVIDER_SITE_OTHER): Payer: Self-pay

## 2013-04-06 DIAGNOSIS — B20 Human immunodeficiency virus [HIV] disease: Secondary | ICD-10-CM

## 2013-04-07 LAB — T-HELPER CELL (CD4) - (RCID CLINIC ONLY): CD4 T Cell Abs: 480 /uL (ref 400–2700)

## 2013-04-07 LAB — COMPREHENSIVE METABOLIC PANEL
ALT: 9 U/L (ref 0–35)
CO2: 27 mEq/L (ref 19–32)
Potassium: 3.9 mEq/L (ref 3.5–5.3)
Sodium: 136 mEq/L (ref 135–145)
Total Bilirubin: 0.3 mg/dL (ref 0.3–1.2)
Total Protein: 7 g/dL (ref 6.0–8.3)

## 2013-04-07 LAB — HIV-1 RNA QUANT-NO REFLEX-BLD
HIV 1 RNA Quant: <20 copies/mL (ref <20)
HIV-1 RNA Quant, Log: <1.3 {Log} (ref <1.30)

## 2013-04-07 LAB — CBC
MCH: 21.3 pg — ABNORMAL LOW (ref 26.0–34.0)
Platelets: 291 10*3/uL (ref 150–400)
RBC: 3.99 MIL/uL (ref 3.87–5.11)
WBC: 5.4 10*3/uL (ref 4.0–10.5)

## 2013-04-20 ENCOUNTER — Ambulatory Visit (INDEPENDENT_AMBULATORY_CARE_PROVIDER_SITE_OTHER): Payer: Self-pay | Admitting: Infectious Diseases

## 2013-04-20 ENCOUNTER — Encounter: Payer: Self-pay | Admitting: Infectious Diseases

## 2013-04-20 VITALS — BP 143/93 | HR 91 | Temp 97.4°F | Ht 67.5 in | Wt 298.0 lb

## 2013-04-20 DIAGNOSIS — F4320 Adjustment disorder, unspecified: Secondary | ICD-10-CM

## 2013-04-20 DIAGNOSIS — D509 Iron deficiency anemia, unspecified: Secondary | ICD-10-CM

## 2013-04-20 DIAGNOSIS — F4321 Adjustment disorder with depressed mood: Secondary | ICD-10-CM

## 2013-04-20 DIAGNOSIS — B20 Human immunodeficiency virus [HIV] disease: Secondary | ICD-10-CM

## 2013-04-20 DIAGNOSIS — Z113 Encounter for screening for infections with a predominantly sexual mode of transmission: Secondary | ICD-10-CM

## 2013-04-20 DIAGNOSIS — Z79899 Other long term (current) drug therapy: Secondary | ICD-10-CM

## 2013-04-20 DIAGNOSIS — Z23 Encounter for immunization: Secondary | ICD-10-CM

## 2013-04-20 MED ORDER — PRENATAL PLUS/IRON 27-1 MG PO TABS: 1.0000 | ORAL_TABLET | Freq: Every day | ORAL | Status: DC

## 2013-04-20 NOTE — Progress Notes (Signed)
  Subjective:    Patient ID: Anita Pacheco, female    DOB: 1971-01-08, 42 y.o.   MRN: [id number]  HPI 42 yo F with HIV+, previously started on complera, HTN (on losartan) and anxiety. Has been seen by ENT and had tubes put in. Has been under stress as her foster son recently died. Funeral last week. Feels depressed. Doesn't want to be in her house. doesn't want to get out of bed. Has trouble sleeping but this is not new from previous. Wants to be left alone, tired of people asking her if something is wrong. Eating a lot of junk food. Wt down 6# from previous. No SI. Enjoys reading right now, watching cartoons, reading [employer]. Has withdrawn from her classes.    HIV 1 RNA Quant (copies/mL)  Date Value  04/06/2013 <20   09/24/2012 <20   03/10/2012 <20      CD4 T Cell Abs (/uL)  Date Value  04/06/2013 480   09/24/2012 540   03/10/2012 360*     Review of Systems  Constitutional: Positive for fatigue. Negative for appetite change and unexpected weight change.  Gastrointestinal: Negative for diarrhea and constipation.  Genitourinary: Negative for difficulty urinating and menstrual problem.       Heavy menses  Psychiatric/Behavioral: Positive for sleep disturbance and dysphoric mood. Negative for suicidal ideas.      Objective:   Physical Exam  Constitutional: She appears well-developed and well-nourished.  HENT:  Mouth/Throat: No oropharyngeal exudate.  Eyes: EOM are normal. Pupils are equal, round, and reactive to light.  Neck: Neck supple.  Cardiovascular: Normal rate, regular rhythm and normal heart sounds.   Pulmonary/Chest: Effort normal and breath sounds normal.  Abdominal: Soft. Bowel sounds are normal.  Lymphadenopathy:    She has no cervical adenopathy.  Psychiatric: Her behavior is normal. Her mood appears not anxious. Her affect is not angry, not blunt, not labile and not inappropriate. Her speech is not rapid and/or pressured. She is not agitated. She exhibits a  depressed mood. She expresses no suicidal ideation. She expresses no suicidal plans.          Assessment & Plan:

## 2013-04-20 NOTE — Assessment & Plan Note (Signed)
She is doing well except for grief reaction. Will check her Hep B S Ab for prev vax. She is offered condoms, refuses. Will get flu shot today. See her back in 3-4 months.

## 2013-04-20 NOTE — Assessment & Plan Note (Addendum)
Has hx of heavy, prolonged periods. Will send for stool guaiac cards. Asked her to avoid advil. Needs PNV with iron. Has been off iron as it made her constipated. Has been eating more spinach, tomatoes, Kale.

## 2013-04-20 NOTE — Assessment & Plan Note (Signed)
Will have her see Bernette Redbird.

## 2013-04-21 ENCOUNTER — Other Ambulatory Visit: Payer: Self-pay | Admitting: Infectious Diseases

## 2013-04-21 DIAGNOSIS — Z23 Encounter for immunization: Secondary | ICD-10-CM

## 2013-04-26 ENCOUNTER — Ambulatory Visit: Payer: Self-pay | Admitting: *Deleted

## 2013-04-26 DIAGNOSIS — F4321 Adjustment disorder with depressed mood: Secondary | ICD-10-CM

## 2013-04-26 NOTE — Progress Notes (Signed)
Patient ID: Anita Pacheco, female   DOB: 1971/06/03, 42 y.o.   MRN: [id number] Writer met Anita Pacheco today. She has been seen at [facility] several times by therapist, Roderic Scarce. Anita Pacheco said that Dr. Ninetta Pacheco had encouraged her to continue accessing support so it appears she may have been placed on this writer's schedule due to [facility] schedule being full. Writer provided active listening and support to Anita Pacheco. We examined her response to family pressures and writer validated some expressed feelings Anita Pacheco shared in regard to her assessment that she is capable, and grieving the loss of her foster son in a manner that is appropriate for her. Her mood shifted considerablly during the session as she was somber when she first came back to the office but able to smile and laugh as we processed several recent experiences. She said that she has touched on the possibility of anti-depressant medication therapy with Dr. Ninetta Pacheco but that she is nit suree she needs that and is still contemplating that decision. Writer offered for Anita Pacheco to make appts as needed for counseling support. Writer informed her of Housing placement service available at [employer] [employer] as she raised this as a possible concern should she not secure employment in the future. Anita Pacheco responded positively to counselor interventions. She demonstrated a solution-oriented mindset and conceded some depression while indicating that she feels able to cope with her present circumstances and is having no SI/HI.   Anita Pacheco, [facility]

## 2013-05-17 ENCOUNTER — Telehealth: Payer: Self-pay | Admitting: *Deleted

## 2013-05-17 NOTE — Telephone Encounter (Signed)
Made appt for annual PAP smear.

## 2013-05-23 ENCOUNTER — Ambulatory Visit: Payer: Self-pay

## 2013-05-23 ENCOUNTER — Telehealth: Payer: Self-pay | Admitting: *Deleted

## 2013-05-23 NOTE — Telephone Encounter (Signed)
Requested pt call [facility] for another PAP smear appt.

## 2013-07-06 ENCOUNTER — Other Ambulatory Visit: Payer: Self-pay | Admitting: *Deleted

## 2013-07-06 DIAGNOSIS — B20 Human immunodeficiency virus [HIV] disease: Secondary | ICD-10-CM

## 2013-07-06 MED ORDER — EMTRICITAB-RILPIVIR-TENOFOV DF 200-25-300 MG PO TABS: 1.0000 | ORAL_TABLET | Freq: Every day | ORAL | Status: DC

## 2013-08-10 ENCOUNTER — Ambulatory Visit: Payer: Self-pay

## 2013-08-10 ENCOUNTER — Other Ambulatory Visit (INDEPENDENT_AMBULATORY_CARE_PROVIDER_SITE_OTHER): Payer: Self-pay

## 2013-08-10 DIAGNOSIS — Z113 Encounter for screening for infections with a predominantly sexual mode of transmission: Secondary | ICD-10-CM

## 2013-08-10 DIAGNOSIS — Z79899 Other long term (current) drug therapy: Secondary | ICD-10-CM

## 2013-08-10 DIAGNOSIS — B20 Human immunodeficiency virus [HIV] disease: Secondary | ICD-10-CM

## 2013-08-10 LAB — CBC WITH DIFFERENTIAL/PLATELET
BASOS ABS: 0 10*3/uL (ref 0.0–0.1)
Basophils Relative: 0 % (ref 0–1)
EOS PCT: 1 % (ref 0–5)
Eosinophils Absolute: 0.1 10*3/uL (ref 0.0–0.7)
HCT: 31.1 % — ABNORMAL LOW (ref 36.0–46.0)
Hemoglobin: 9.7 g/dL — ABNORMAL LOW (ref 12.0–15.0)
LYMPHS ABS: 1.6 10*3/uL (ref 0.7–4.0)
LYMPHS PCT: 32 % (ref 12–46)
MCH: 22.4 pg — ABNORMAL LOW (ref 26.0–34.0)
MCHC: 31.2 g/dL (ref 30.0–36.0)
MCV: 71.7 fL — AB (ref 78.0–100.0)
Monocytes Absolute: 0.5 10*3/uL (ref 0.1–1.0)
Monocytes Relative: 9 % (ref 3–12)
NEUTROS ABS: 3 10*3/uL (ref 1.7–7.7)
Neutrophils Relative %: 58 % (ref 43–77)
PLATELETS: 297 10*3/uL (ref 150–400)
RBC: 4.34 MIL/uL (ref 3.87–5.11)
RDW: 19.3 % — AB (ref 11.5–15.5)
WBC: 5.1 10*3/uL (ref 4.0–10.5)

## 2013-08-10 LAB — COMPLETE METABOLIC PANEL WITH GFR
ALBUMIN: 3.3 g/dL — AB (ref 3.5–5.2)
ALT: 10 U/L (ref 0–35)
AST: 14 U/L (ref 0–37)
Alkaline Phosphatase: 66 U/L (ref 39–117)
BUN: 12 mg/dL (ref 6–23)
CALCIUM: 8.2 mg/dL — AB (ref 8.4–10.5)
CHLORIDE: 104 meq/L (ref 96–112)
CO2: 27 meq/L (ref 19–32)
Creat: 0.77 mg/dL (ref 0.50–1.10)
GFR, Est Non African American: >89 mL/min
Glucose, Bld: 96 mg/dL (ref 70–99)
POTASSIUM: 3.9 meq/L (ref 3.5–5.3)
SODIUM: 138 meq/L (ref 135–145)
TOTAL PROTEIN: 7 g/dL (ref 6.0–8.3)
Total Bilirubin: 0.3 mg/dL (ref 0.2–1.2)

## 2013-08-10 LAB — LIPID PANEL
CHOL/HDL RATIO: 4 ratio
Cholesterol: 158 mg/dL (ref 0–200)
HDL: 40 mg/dL (ref >39)
LDL Cholesterol: 98 mg/dL (ref 0–99)
TRIGLYCERIDES: 102 mg/dL (ref <150)
VLDL: 20 mg/dL (ref 0–40)

## 2013-08-11 LAB — HIV-1 RNA QUANT-NO REFLEX-BLD: HIV 1 RNA Quant: <20 copies/mL (ref <20)

## 2013-08-11 LAB — RPR

## 2013-08-11 LAB — T-HELPER CELL (CD4) - (RCID CLINIC ONLY)
CD4 % Helper T Cell: 30 % — ABNORMAL LOW (ref 33–55)
CD4 T Cell Abs: 480 /uL (ref 400–2700)

## 2013-08-25 ENCOUNTER — Ambulatory Visit: Payer: Self-pay

## 2013-08-31 ENCOUNTER — Ambulatory Visit: Payer: Self-pay

## 2013-08-31 ENCOUNTER — Encounter: Payer: Self-pay | Admitting: *Deleted

## 2013-08-31 ENCOUNTER — Ambulatory Visit: Payer: Self-pay | Admitting: Infectious Diseases

## 2013-08-31 ENCOUNTER — Telehealth: Payer: Self-pay | Admitting: *Deleted

## 2013-08-31 NOTE — Telephone Encounter (Signed)
Patient no showed her appt today she thought it was at 2:00 pm. Rescheduled with Dr. Ninetta LightsHatcher for 09/21/13 at 10:45 AM. Transferred to [facility] to reschedule her ADAP and Juanell Fairlyyan White renewal. Anita MolaJacqueline Cockerham

## 2013-09-07 ENCOUNTER — Other Ambulatory Visit: Payer: Self-pay | Admitting: *Deleted

## 2013-09-07 DIAGNOSIS — B20 Human immunodeficiency virus [HIV] disease: Secondary | ICD-10-CM

## 2013-09-07 MED ORDER — EMTRICITAB-RILPIVIR-TENOFOV DF 200-25-300 MG PO TABS: 1.0000 | ORAL_TABLET | Freq: Every day | ORAL | Status: DC

## 2013-09-21 ENCOUNTER — Ambulatory Visit (INDEPENDENT_AMBULATORY_CARE_PROVIDER_SITE_OTHER): Payer: Self-pay | Admitting: Infectious Diseases

## 2013-09-21 ENCOUNTER — Encounter: Payer: Self-pay | Admitting: Infectious Diseases

## 2013-09-21 VITALS — BP 178/111 | HR 81 | Temp 98.1°F | Ht 67.0 in | Wt 301.0 lb

## 2013-09-21 DIAGNOSIS — B20 Human immunodeficiency virus [HIV] disease: Secondary | ICD-10-CM

## 2013-09-21 DIAGNOSIS — F4321 Adjustment disorder with depressed mood: Secondary | ICD-10-CM

## 2013-09-21 DIAGNOSIS — I10 Essential (primary) hypertension: Secondary | ICD-10-CM

## 2013-09-21 NOTE — Progress Notes (Signed)
   Subjective:    Patient ID: Anita Pacheco, female    DOB: 06-08-1971, 43 y.o.   MRN: [id number]  HPI 43 yo F with HIV+, previously started on complera, HTN (on losartan) and anxiety.  At her previous visit in October, Anita Pacheco was grieving the death of her son. Anita Pacheco is now grieving the death of her mom from earlier this month.  Has not been able to take time off work. Now has sore throat, ears bothering her.  No problems with her meds.   HIV 1 RNA Quant (copies/mL)  Date Value  08/10/2013 <20   04/06/2013 <20   09/24/2012 <20      CD4 T Cell Abs (/uL)  Date Value  08/10/2013 480   04/06/2013 480   09/24/2012 540     Review of Systems  Constitutional: Negative for appetite change and unexpected weight change.  HENT: Positive for ear pain.   Respiratory: Negative for shortness of breath.   Cardiovascular: Negative for chest pain.  Gastrointestinal: Negative for diarrhea and constipation.  Genitourinary: Negative for difficulty urinating.  Neurological: Positive for headaches.  Psychiatric/Behavioral: Positive for sleep disturbance.  dull headache.      Objective:   Physical Exam  Constitutional: Anita Pacheco appears well-developed and well-nourished.  HENT:  Right Ear: Tympanic membrane is retracted.  Left Ear: Tympanic membrane is retracted.  Ears:  Mouth/Throat: No oropharyngeal exudate.  Eyes: EOM are normal. Pupils are equal, round, and reactive to light.  Neck: Neck supple.  Cardiovascular: Normal rate, regular rhythm and normal heart sounds.   Pulmonary/Chest: Effort normal and breath sounds normal.  Abdominal: Soft. Bowel sounds are normal. Anita Pacheco exhibits no distension. There is no tenderness.  Lymphadenopathy:    Anita Pacheco has no cervical adenopathy.  Psychiatric: Her mood appears not anxious. Her affect is not angry, not blunt, not labile and not inappropriate. Anita Pacheco exhibits a depressed mood.  States Anita Pacheco has not felt overwhelmed, hasn't had time.           Assessment & Plan:

## 2013-09-21 NOTE — Assessment & Plan Note (Signed)
She's doing very well. Offered/refuses condoms.

## 2013-09-21 NOTE — Assessment & Plan Note (Signed)
I offered to have her meet with mental health counselor but she defers.

## 2013-09-21 NOTE — Assessment & Plan Note (Signed)
She is asx except for mild headaches. She does not want to go to IM due to co-pay. Will see her back in 3 months when she can assure me she has been adherent with her losartan (she cannot do this today).

## 2013-09-26 ENCOUNTER — Encounter: Payer: Self-pay | Admitting: Internal Medicine

## 2013-12-12 ENCOUNTER — Other Ambulatory Visit: Payer: Self-pay

## 2013-12-26 ENCOUNTER — Other Ambulatory Visit: Payer: Self-pay

## 2013-12-26 ENCOUNTER — Ambulatory Visit: Payer: Self-pay | Admitting: Infectious Diseases

## 2013-12-27 ENCOUNTER — Other Ambulatory Visit (INDEPENDENT_AMBULATORY_CARE_PROVIDER_SITE_OTHER): Payer: Self-pay

## 2013-12-27 DIAGNOSIS — Z113 Encounter for screening for infections with a predominantly sexual mode of transmission: Secondary | ICD-10-CM

## 2013-12-27 DIAGNOSIS — B20 Human immunodeficiency virus [HIV] disease: Secondary | ICD-10-CM

## 2013-12-27 LAB — COMPLETE METABOLIC PANEL WITH GFR
ALBUMIN: 3.5 g/dL (ref 3.5–5.2)
ALT: 10 U/L (ref 0–35)
AST: 12 U/L (ref 0–37)
Alkaline Phosphatase: 65 U/L (ref 39–117)
BUN: 13 mg/dL (ref 6–23)
CO2: 28 meq/L (ref 19–32)
CREATININE: 0.79 mg/dL (ref 0.50–1.10)
Calcium: 8.6 mg/dL (ref 8.4–10.5)
Chloride: 101 mEq/L (ref 96–112)
GFR, Est Non African American: >89 mL/min
GLUCOSE: 107 mg/dL — AB (ref 70–99)
Potassium: 3.8 mEq/L (ref 3.5–5.3)
Sodium: 136 mEq/L (ref 135–145)
Total Bilirubin: 0.4 mg/dL (ref 0.2–1.2)
Total Protein: 7 g/dL (ref 6.0–8.3)

## 2013-12-27 LAB — CBC WITH DIFFERENTIAL/PLATELET
Basophils Absolute: 0 10*3/uL (ref 0.0–0.1)
Basophils Relative: 0 % (ref 0–1)
EOS PCT: 1 % (ref 0–5)
Eosinophils Absolute: 0 10*3/uL (ref 0.0–0.7)
HEMATOCRIT: 31.5 % — AB (ref 36.0–46.0)
HEMOGLOBIN: 10 g/dL — AB (ref 12.0–15.0)
LYMPHS PCT: 33 % (ref 12–46)
Lymphs Abs: 1.6 10*3/uL (ref 0.7–4.0)
MCH: 23.4 pg — ABNORMAL LOW (ref 26.0–34.0)
MCHC: 31.7 g/dL (ref 30.0–36.0)
MCV: 73.8 fL — AB (ref 78.0–100.0)
MONO ABS: 0.4 10*3/uL (ref 0.1–1.0)
MONOS PCT: 9 % (ref 3–12)
Neutro Abs: 2.7 10*3/uL (ref 1.7–7.7)
Neutrophils Relative %: 57 % (ref 43–77)
Platelets: 281 10*3/uL (ref 150–400)
RBC: 4.27 MIL/uL (ref 3.87–5.11)
RDW: 19.1 % — AB (ref 11.5–15.5)
WBC: 4.8 10*3/uL (ref 4.0–10.5)

## 2013-12-28 LAB — T-HELPER CELL (CD4) - (RCID CLINIC ONLY)
CD4 % Helper T Cell: 32 % — ABNORMAL LOW (ref 33–55)
CD4 T CELL ABS: 510 /uL (ref 400–2700)

## 2013-12-28 LAB — HEPATITIS B SURFACE ANTIBODY,QUALITATIVE

## 2013-12-30 LAB — HIV-1 RNA QUANT-NO REFLEX-BLD: HIV 1 RNA Quant: <20 copies/mL (ref <20)

## 2014-01-11 ENCOUNTER — Other Ambulatory Visit: Payer: Self-pay | Admitting: *Deleted

## 2014-01-11 ENCOUNTER — Ambulatory Visit (INDEPENDENT_AMBULATORY_CARE_PROVIDER_SITE_OTHER): Payer: Self-pay | Admitting: Infectious Diseases

## 2014-01-11 ENCOUNTER — Encounter: Payer: Self-pay | Admitting: Infectious Diseases

## 2014-01-11 ENCOUNTER — Ambulatory Visit: Payer: Self-pay

## 2014-01-11 VITALS — BP 156/110 | HR 88 | Temp 98.0°F | Ht 67.0 in | Wt 300.0 lb

## 2014-01-11 DIAGNOSIS — I10 Essential (primary) hypertension: Secondary | ICD-10-CM

## 2014-01-11 DIAGNOSIS — D509 Iron deficiency anemia, unspecified: Secondary | ICD-10-CM

## 2014-01-11 DIAGNOSIS — Z124 Encounter for screening for malignant neoplasm of cervix: Secondary | ICD-10-CM

## 2014-01-11 DIAGNOSIS — B20 Human immunodeficiency virus [HIV] disease: Secondary | ICD-10-CM

## 2014-01-11 DIAGNOSIS — E669 Obesity, unspecified: Secondary | ICD-10-CM

## 2014-01-11 MED ORDER — LOSARTAN POTASSIUM 100 MG PO TABS: 100.0000 mg | ORAL_TABLET | Freq: Every day | ORAL | Status: DC

## 2014-01-11 MED ORDER — EMTRICITAB-RILPIVIR-TENOFOV DF 200-25-300 MG PO TABS: 1.0000 | ORAL_TABLET | Freq: Every day | ORAL | Status: DC

## 2014-01-11 NOTE — Assessment & Plan Note (Signed)
Encouraged her to restart her ARB. She wants to come back in clinic in 6 months, i said this was ok as long as she takes her anti-htn rx daily.

## 2014-01-11 NOTE — Addendum Note (Signed)
Addended by: Jennet MaduroESTRIDGE, Yaden Seith D on: 01/11/2014 11:40 AM   Modules accepted: Orders

## 2014-01-11 NOTE — Assessment & Plan Note (Signed)
Encouraged her to diet and exercise. Will improve her weight, her BP, her mildly elevated GLC

## 2014-01-11 NOTE — Assessment & Plan Note (Signed)
She's doing well, offered/refuses condoms. Will send for PAP. Will defer on restarting her Hep B series. Will see her back in 6 months.

## 2014-01-11 NOTE — Progress Notes (Signed)
   Subjective:    Patient ID: Anita Pacheco, female    DOB: [birth year], 43 y.o.   MRN: [id number]  HPI 43 yo F with HIV+, previously started on complera, HTN (on cozar) and anxiety.  Has been off her anti-htn rx for 2 weeks.  No problems with ART. Without complaints today.  Last PAP was ? (03-10-12).  Hep B S Ab indeterminant 11-2013.   HIV 1 RNA Quant (copies/mL)  Date Value  12/27/2013 <20   08/10/2013 <20   04/06/2013 <20      CD4 T Cell Abs (/uL)  Date Value  12/27/2013 510   08/10/2013 480   04/06/2013 480    Wt has been increasing, attributes to her work schedule and poor eating habits. No exercise, has been walking some.  Has headaches when her anxiety is high.   HIV 1 RNA Quant (copies/mL)  Date Value  12/27/2013 <20   08/10/2013 <20   04/06/2013 <20      CD4 T Cell Abs (/uL)  Date Value  12/27/2013 510   08/10/2013 480   04/06/2013 480     Lab Results  Component Value Date   CHOL 158 08/10/2013   HDL 40 08/10/2013   LDLCALC 98 08/10/2013   TRIG 102 08/10/2013   CHOLHDL 4.0 08/10/2013      Review of Systems  Constitutional: Negative for appetite change and unexpected weight change.  Cardiovascular: Negative for chest pain.  Gastrointestinal: Negative for nausea and diarrhea.  Genitourinary: Negative for difficulty urinating and menstrual problem.  Neurological: Negative for headaches.      Objective:   Physical Exam  Constitutional: She appears well-developed and well-nourished.  HENT:  Mouth/Throat: No oropharyngeal exudate.  Eyes: EOM are normal. Pupils are equal, round, and reactive to light.  Neck: Neck supple.  Cardiovascular: Normal rate, regular rhythm and normal heart sounds.   Pulmonary/Chest: Effort normal and breath sounds normal.  Abdominal: Soft. Bowel sounds are normal. She exhibits no distension. There is no tenderness.  Lymphadenopathy:    She has no cervical adenopathy.          Assessment & Plan:

## 2014-01-12 LAB — CYTOLOGY - PAP

## 2014-01-17 ENCOUNTER — Encounter: Payer: Self-pay | Admitting: *Deleted

## 2014-01-20 ENCOUNTER — Other Ambulatory Visit: Payer: Self-pay | Admitting: Licensed Clinical Social Worker

## 2014-01-20 DIAGNOSIS — B20 Human immunodeficiency virus [HIV] disease: Secondary | ICD-10-CM

## 2014-01-20 DIAGNOSIS — I10 Essential (primary) hypertension: Secondary | ICD-10-CM

## 2014-01-20 MED ORDER — LOSARTAN POTASSIUM 100 MG PO TABS: 100.0000 mg | ORAL_TABLET | Freq: Every day | ORAL | Status: DC

## 2014-01-20 MED ORDER — PREGABALIN 50 MG PO CAPS
50.0000 mg | ORAL_CAPSULE | ORAL | Status: DC
Start: 2014-01-20 — End: 2015-01-24

## 2014-03-01 ENCOUNTER — Other Ambulatory Visit: Payer: Self-pay | Admitting: Infectious Diseases

## 2014-04-12 ENCOUNTER — Other Ambulatory Visit: Payer: Self-pay | Admitting: *Deleted

## 2014-04-12 MED ORDER — EMTRICITAB-RILPIVIR-TENOFOV DF 200-25-300 MG PO TABS: ORAL_TABLET | ORAL | Status: DC

## 2014-05-12 ENCOUNTER — Telehealth: Payer: Self-pay | Admitting: *Deleted

## 2014-05-12 NOTE — Telephone Encounter (Signed)
Patient called stating she restarted her Complera 5 days ago after being without it for a month due to an error with ADAP. She has been having problems "itching" but no rash and has not started any new meds. She does not remember having this problem previously. Advised to try benadryl to see if this provides relief. Note sent to MD for further advice.

## 2014-07-05 ENCOUNTER — Other Ambulatory Visit (INDEPENDENT_AMBULATORY_CARE_PROVIDER_SITE_OTHER): Payer: Self-pay

## 2014-07-05 DIAGNOSIS — B20 Human immunodeficiency virus [HIV] disease: Secondary | ICD-10-CM

## 2014-07-05 LAB — CBC
HCT: 32.3 % — ABNORMAL LOW (ref 36.0–46.0)
Hemoglobin: 10.2 g/dL — ABNORMAL LOW (ref 12.0–15.0)
MCH: 23.9 pg — ABNORMAL LOW (ref 26.0–34.0)
MCHC: 31.6 g/dL (ref 30.0–36.0)
MCV: 75.8 fL — AB (ref 78.0–100.0)
MPV: 9.4 fL (ref 8.6–12.4)
PLATELETS: 296 10*3/uL (ref 150–400)
RBC: 4.26 MIL/uL (ref 3.87–5.11)
RDW: 18.9 % — ABNORMAL HIGH (ref 11.5–15.5)
WBC: 5.3 10*3/uL (ref 4.0–10.5)

## 2014-07-05 LAB — COMPREHENSIVE METABOLIC PANEL
ALT: 11 U/L (ref 0–35)
AST: 16 U/L (ref 0–37)
Albumin: 3.5 g/dL (ref 3.5–5.2)
Alkaline Phosphatase: 69 U/L (ref 39–117)
BUN: 14 mg/dL (ref 6–23)
CALCIUM: 8.9 mg/dL (ref 8.4–10.5)
CO2: 26 meq/L (ref 19–32)
Chloride: 105 mEq/L (ref 96–112)
Creat: 0.65 mg/dL (ref 0.50–1.10)
Glucose, Bld: 99 mg/dL (ref 70–99)
POTASSIUM: 4.1 meq/L (ref 3.5–5.3)
Sodium: 140 mEq/L (ref 135–145)
Total Bilirubin: 0.4 mg/dL (ref 0.2–1.2)
Total Protein: 7.1 g/dL (ref 6.0–8.3)

## 2014-07-06 LAB — T-HELPER CELL (CD4) - (RCID CLINIC ONLY)
CD4 % Helper T Cell: 28 % — ABNORMAL LOW (ref 33–55)
CD4 T CELL ABS: 560 /uL (ref 400–2700)

## 2014-07-07 LAB — HIV-1 RNA QUANT-NO REFLEX-BLD: HIV 1 RNA Quant: <20 copies/mL (ref <20)

## 2014-07-19 ENCOUNTER — Encounter: Payer: Self-pay | Admitting: Infectious Diseases

## 2014-07-19 ENCOUNTER — Ambulatory Visit (INDEPENDENT_AMBULATORY_CARE_PROVIDER_SITE_OTHER): Payer: Self-pay | Admitting: Infectious Diseases

## 2014-07-19 VITALS — BP 157/99 | HR 76 | Temp 98.1°F | Ht 67.5 in | Wt 300.0 lb

## 2014-07-19 DIAGNOSIS — I10 Essential (primary) hypertension: Secondary | ICD-10-CM

## 2014-07-19 DIAGNOSIS — Z79899 Other long term (current) drug therapy: Secondary | ICD-10-CM

## 2014-07-19 DIAGNOSIS — B20 Human immunodeficiency virus [HIV] disease: Secondary | ICD-10-CM

## 2014-07-19 DIAGNOSIS — Z113 Encounter for screening for infections with a predominantly sexual mode of transmission: Secondary | ICD-10-CM

## 2014-07-19 MED ORDER — LOSARTAN POTASSIUM 100 MG PO TABS: 100.0000 mg | ORAL_TABLET | Freq: Every day | ORAL | Status: DC

## 2014-07-19 NOTE — Assessment & Plan Note (Signed)
Will get her in with PCP. States she is taking her ARB. Advised her to take NSAIDS with caution.

## 2014-07-19 NOTE — Progress Notes (Signed)
   Subjective:    Patient ID: Royann Shiversonette Thornell, female    DOB: 1970/10/28, 44 y.o.   MRN: [id number]  HPI 44 yo F with HIV+, previously started on complera, HTN (on cozar) and anxiety.  PAP negative, 01-11-14.  Feels cold and tired. Feels like she is having sinus problems.  Stressed because she thinks she may need to move, needs walls and pipes replaced.  Has realized she will not go back to the gym, feels like running on treadmill is hurting her knees. Has not been able to maintain wt loss. Wants to see GP as well. Has been taking cozar. [facility].   HIV 1 RNA QUANT (copies/mL)  Date Value  07/05/2014 <20  12/27/2013 <20  08/10/2013 <20   CD4 T CELL ABS (/uL)  Date Value  07/05/2014 560  12/27/2013 510  08/10/2013 480    Has had headaches, attributes to stress, getting hungry.  Review of Systems  Constitutional: Negative for appetite change and unexpected weight change.  HENT: Positive for sinus pressure.   Cardiovascular: Negative for chest pain.  Gastrointestinal: Negative for diarrhea and constipation.  Genitourinary: Negative for difficulty urinating.  Neurological: Positive for headaches.       Objective:   Physical Exam  Constitutional: She appears well-developed and well-nourished.  HENT:  Mouth/Throat: No oropharyngeal exudate.  Eyes: EOM are normal. Pupils are equal, round, and reactive to light.  Neck: Neck supple.  Cardiovascular: Normal rate, regular rhythm and normal heart sounds.   Pulmonary/Chest: Effort normal and breath sounds normal.  Abdominal: Soft. Bowel sounds are normal. She exhibits no distension. There is no tenderness.  Lymphadenopathy:    She has no cervical adenopathy.          Assessment & Plan:

## 2014-07-19 NOTE — Assessment & Plan Note (Signed)
Will continue her on complera. Offered her flu shot. She refuses due to sinus issues. Offered her condoms, she states "i don't like people." will see her back in 6 months. With labs prior.

## 2014-07-26 ENCOUNTER — Ambulatory Visit (INDEPENDENT_AMBULATORY_CARE_PROVIDER_SITE_OTHER): Payer: Self-pay

## 2014-07-26 DIAGNOSIS — Z23 Encounter for immunization: Secondary | ICD-10-CM

## 2014-08-31 ENCOUNTER — Other Ambulatory Visit: Payer: Self-pay | Admitting: Infectious Diseases

## 2014-11-01 ENCOUNTER — Ambulatory Visit: Payer: Self-pay

## 2014-11-06 ENCOUNTER — Other Ambulatory Visit: Payer: Self-pay | Admitting: *Deleted

## 2014-11-06 DIAGNOSIS — B20 Human immunodeficiency virus [HIV] disease: Secondary | ICD-10-CM

## 2014-11-06 MED ORDER — EMTRICITAB-RILPIVIR-TENOFOV DF 200-25-300 MG PO TABS: ORAL_TABLET | ORAL | Status: DC

## 2014-11-06 NOTE — Telephone Encounter (Signed)
ADAP Application 

## 2014-11-23 ENCOUNTER — Other Ambulatory Visit: Payer: Self-pay | Admitting: Infectious Diseases

## 2014-11-23 DIAGNOSIS — B20 Human immunodeficiency virus [HIV] disease: Secondary | ICD-10-CM

## 2014-12-22 ENCOUNTER — Ambulatory Visit: Payer: Self-pay

## 2015-01-05 ENCOUNTER — Ambulatory Visit: Payer: Self-pay

## 2015-01-05 ENCOUNTER — Other Ambulatory Visit: Payer: Self-pay

## 2015-01-09 ENCOUNTER — Other Ambulatory Visit (INDEPENDENT_AMBULATORY_CARE_PROVIDER_SITE_OTHER): Payer: Self-pay

## 2015-01-09 DIAGNOSIS — Z79899 Other long term (current) drug therapy: Secondary | ICD-10-CM

## 2015-01-09 DIAGNOSIS — Z113 Encounter for screening for infections with a predominantly sexual mode of transmission: Secondary | ICD-10-CM

## 2015-01-09 DIAGNOSIS — B20 Human immunodeficiency virus [HIV] disease: Secondary | ICD-10-CM

## 2015-01-09 LAB — COMPREHENSIVE METABOLIC PANEL
ALT: 11 U/L (ref 0–35)
AST: 13 U/L (ref 0–37)
Albumin: 3.4 g/dL — ABNORMAL LOW (ref 3.5–5.2)
Alkaline Phosphatase: 62 U/L (ref 39–117)
BILIRUBIN TOTAL: 0.2 mg/dL (ref 0.2–1.2)
BUN: 14 mg/dL (ref 6–23)
CALCIUM: 9 mg/dL (ref 8.4–10.5)
CO2: 28 mEq/L (ref 19–32)
Chloride: 105 mEq/L (ref 96–112)
Creat: 0.82 mg/dL (ref 0.50–1.10)
Glucose, Bld: 89 mg/dL (ref 70–99)
Potassium: 3.6 mEq/L (ref 3.5–5.3)
Sodium: 140 mEq/L (ref 135–145)
TOTAL PROTEIN: 7.1 g/dL (ref 6.0–8.3)

## 2015-01-09 LAB — CBC
HCT: 34.2 % — ABNORMAL LOW (ref 36.0–46.0)
Hemoglobin: 11 g/dL — ABNORMAL LOW (ref 12.0–15.0)
MCH: 25.6 pg — ABNORMAL LOW (ref 26.0–34.0)
MCHC: 32.2 g/dL (ref 30.0–36.0)
MCV: 79.5 fL (ref 78.0–100.0)
MPV: 9.9 fL (ref 8.6–12.4)
Platelets: 268 10*3/uL (ref 150–400)
RBC: 4.3 MIL/uL (ref 3.87–5.11)
RDW: 17.3 % — ABNORMAL HIGH (ref 11.5–15.5)
WBC: 7.1 10*3/uL (ref 4.0–10.5)

## 2015-01-09 LAB — LIPID PANEL
CHOLESTEROL: 186 mg/dL (ref 0–200)
HDL: 32 mg/dL — AB (ref >=46)
LDL CALC: 113 mg/dL — AB (ref 0–99)
TRIGLYCERIDES: 206 mg/dL — AB (ref <150)
Total CHOL/HDL Ratio: 5.8 Ratio
VLDL: 41 mg/dL — AB (ref 0–40)

## 2015-01-10 ENCOUNTER — Other Ambulatory Visit: Payer: Self-pay

## 2015-01-10 LAB — RPR

## 2015-01-11 LAB — HIV-1 RNA QUANT-NO REFLEX-BLD
HIV 1 RNA Quant: <20 copies/mL (ref <20)
HIV-1 RNA Quant, Log: <1.3 {Log} (ref <1.30)

## 2015-01-11 LAB — T-HELPER CELL (CD4) - (RCID CLINIC ONLY)
CD4 T CELL ABS: 690 /uL (ref 400–2700)
CD4 T CELL HELPER: 34 % (ref 33–55)

## 2015-01-24 ENCOUNTER — Ambulatory Visit (INDEPENDENT_AMBULATORY_CARE_PROVIDER_SITE_OTHER): Payer: Self-pay | Admitting: Infectious Diseases

## 2015-01-24 ENCOUNTER — Encounter: Payer: Self-pay | Admitting: Infectious Diseases

## 2015-01-24 ENCOUNTER — Ambulatory Visit: Payer: Self-pay

## 2015-01-24 VITALS — BP 167/95 | HR 83 | Temp 98.3°F | Wt 310.0 lb

## 2015-01-24 DIAGNOSIS — G609 Hereditary and idiopathic neuropathy, unspecified: Secondary | ICD-10-CM

## 2015-01-24 DIAGNOSIS — I10 Essential (primary) hypertension: Secondary | ICD-10-CM

## 2015-01-24 DIAGNOSIS — E669 Obesity, unspecified: Secondary | ICD-10-CM

## 2015-01-24 DIAGNOSIS — B20 Human immunodeficiency virus [HIV] disease: Secondary | ICD-10-CM

## 2015-01-24 DIAGNOSIS — G4700 Insomnia, unspecified: Secondary | ICD-10-CM

## 2015-01-24 MED ORDER — PREGABALIN 50 MG PO CAPS: 50.0000 mg | ORAL_CAPSULE | ORAL | Status: DC

## 2015-01-24 MED ORDER — LOSARTAN POTASSIUM 100 MG PO TABS: 100.0000 mg | ORAL_TABLET | Freq: Every day | ORAL | Status: DC

## 2015-01-24 MED ORDER — DICLOFENAC SODIUM 1 % TD GEL: 1.0000 "application " | Freq: Four times a day (QID) | TRANSDERMAL | Status: DC

## 2015-01-24 NOTE — Assessment & Plan Note (Addendum)
Will refill her losaartan, encourage to take daily.  She is going to establish with PCP Fatigue related to this and her lack of sleep

## 2015-01-24 NOTE — Assessment & Plan Note (Addendum)
I reviewed her labs with her.  Will continue complera To have PAP next month Offered/refused condoms.  Will see her back in 6 months

## 2015-01-24 NOTE — Progress Notes (Signed)
   Subjective:    Patient ID: Royann Shivers, female    DOB: [birth year], 44 y.o.   MRN: [id number]  HPI 44 yo F with HIV+, previously started on complera, HTN (on cozar) and anxiety.  PAP negative, 01-11-14.  Her CHIEF COMPLAINT is fatigue.  Relates this to her BP being up. Needs new rx today.  Wants refill for arthritis gel for her knee. has been taking lyrica prn for her knee as well. Has been having cramping in her hands.  No problems with complera.  Not sleeping well- recent death in her family.   HIV 1 RNA QUANT (copies/mL)  Date Value  01/09/2015 <20  07/05/2014 <20  12/27/2013 <20   CD4 T CELL ABS (/uL)  Date Value  01/09/2015 690  07/05/2014 560  12/27/2013 510   Wt is up, attributes to fast food.  Is to have PAP next month.   Review of Systems  Constitutional: Negative for appetite change and unexpected weight change.  Gastrointestinal: Negative for diarrhea and constipation.  Genitourinary: Negative for difficulty urinating and menstrual problem.  Musculoskeletal: Positive for myalgias and arthralgias.       Objective:   Physical Exam  Constitutional: She appears well-developed and well-nourished.  HENT:  Mouth/Throat: No oropharyngeal exudate.  Eyes: EOM are normal. Pupils are equal, round, and reactive to light.  Neck: Neck supple.  Cardiovascular: Normal rate, regular rhythm and normal heart sounds.   Pulmonary/Chest: Effort normal and breath sounds normal.  Abdominal: Soft. Bowel sounds are normal. There is no tenderness. There is no rebound.  Musculoskeletal: She exhibits no edema.  Lymphadenopathy:    She has no cervical adenopathy.       Assessment & Plan:

## 2015-01-24 NOTE — Assessment & Plan Note (Signed)
Will continue on her lyrica I encouraged her to take Lyrica regularly

## 2015-01-24 NOTE — Assessment & Plan Note (Signed)
Encouraged her to exercise, watch diet.  

## 2015-01-24 NOTE — Assessment & Plan Note (Signed)
Offered to write her sleep rx. She will use lyrica.  Afraid to take other rx.

## 2015-02-02 ENCOUNTER — Encounter (INDEPENDENT_AMBULATORY_CARE_PROVIDER_SITE_OTHER): Payer: Self-pay | Admitting: *Deleted

## 2015-02-02 NOTE — Progress Notes (Signed)
Cancel PAP - rescheduled to 02/23/15.  Pt on menses.

## 2015-02-14 ENCOUNTER — Other Ambulatory Visit: Payer: Self-pay

## 2015-02-14 ENCOUNTER — Other Ambulatory Visit: Payer: Self-pay | Admitting: Infectious Diseases

## 2015-02-14 DIAGNOSIS — G609 Hereditary and idiopathic neuropathy, unspecified: Secondary | ICD-10-CM

## 2015-02-14 MED ORDER — PREGABALIN 50 MG PO CAPS: 50.0000 mg | ORAL_CAPSULE | ORAL | Status: DC

## 2015-02-20 ENCOUNTER — Telehealth: Payer: Self-pay | Admitting: *Deleted

## 2015-02-20 NOTE — Telephone Encounter (Signed)
Rescheduled PAP smear appt to 03/09/15 @ 1000

## 2015-02-23 ENCOUNTER — Ambulatory Visit: Payer: Self-pay

## 2015-03-09 ENCOUNTER — Ambulatory Visit (INDEPENDENT_AMBULATORY_CARE_PROVIDER_SITE_OTHER): Payer: Self-pay | Admitting: *Deleted

## 2015-03-09 DIAGNOSIS — Z124 Encounter for screening for malignant neoplasm of cervix: Secondary | ICD-10-CM

## 2015-03-09 DIAGNOSIS — Z113 Encounter for screening for infections with a predominantly sexual mode of transmission: Secondary | ICD-10-CM

## 2015-03-09 NOTE — Progress Notes (Signed)
  Subjective:     Anita Pacheco is a 44 y.o. woman who comes in today for a  pap smear only.  Previous abnormal Pap smears: {no. Contraception: condoms.  Objective:    There were no vitals taken for this visit.  LMP 02/23/15 Pelvic Exam:Pap smear obtained.   Assessment:    Screening pap smear.   Plan:    Follow up in one year, or as indicated by Pap results.  Pt given educational materials re: HIV and women, self-esteem, BSE, nutrition and diet management, PAP smears and partner safety. Pt given condoms

## 2015-03-09 NOTE — Patient Instructions (Signed)
Your results will be ready in about a week.  i will send them to your in MyChart.  Thank you for coming to the Center for your care.  Angelique Blonder, RN

## 2015-03-12 LAB — CYTOLOGY - PAP

## 2015-03-12 LAB — CERVICOVAGINAL ANCILLARY ONLY
CHLAMYDIA, DNA PROBE: NEGATIVE
NEISSERIA GONORRHEA: NEGATIVE
TRICH (WINDOWPATH): NEGATIVE

## 2015-03-29 ENCOUNTER — Encounter: Payer: Self-pay | Admitting: *Deleted

## 2015-04-09 NOTE — Progress Notes (Signed)
Notified [facility] via fax.  Adriahna Shearman M, RN  

## 2015-04-12 ENCOUNTER — Encounter: Payer: Self-pay | Admitting: *Deleted

## 2015-05-07 ENCOUNTER — Other Ambulatory Visit: Payer: Self-pay | Admitting: Infectious Diseases

## 2015-05-10 ENCOUNTER — Other Ambulatory Visit: Payer: Self-pay | Admitting: *Deleted

## 2015-05-10 DIAGNOSIS — I10 Essential (primary) hypertension: Secondary | ICD-10-CM

## 2015-05-10 MED ORDER — LOSARTAN POTASSIUM 100 MG PO TABS: 100.0000 mg | ORAL_TABLET | Freq: Every day | ORAL | Status: DC

## 2015-08-06 ENCOUNTER — Telehealth: Payer: Self-pay

## 2015-08-06 NOTE — Telephone Encounter (Signed)
RECEIVED CALL FROM [facility] UX:[id number] OF CONTACT NUMBER

## 2015-10-11 ENCOUNTER — Other Ambulatory Visit (INDEPENDENT_AMBULATORY_CARE_PROVIDER_SITE_OTHER): Payer: Self-pay

## 2015-10-11 ENCOUNTER — Ambulatory Visit: Payer: Self-pay

## 2015-10-11 DIAGNOSIS — B20 Human immunodeficiency virus [HIV] disease: Secondary | ICD-10-CM

## 2015-10-11 DIAGNOSIS — Z113 Encounter for screening for infections with a predominantly sexual mode of transmission: Secondary | ICD-10-CM

## 2015-10-11 DIAGNOSIS — Z79899 Other long term (current) drug therapy: Secondary | ICD-10-CM

## 2015-10-11 LAB — CBC
HCT: 35.7 % (ref 35.0–45.0)
HEMOGLOBIN: 11.5 g/dL — AB (ref 11.7–15.5)
MCH: 26.7 pg — AB (ref 27.0–33.0)
MCHC: 32.2 g/dL (ref 32.0–36.0)
MCV: 82.8 fL (ref 80.0–100.0)
MPV: 9.8 fL (ref 7.5–12.5)
Platelets: 239 10*3/uL (ref 140–400)
RBC: 4.31 MIL/uL (ref 3.80–5.10)
RDW: 16.2 % — ABNORMAL HIGH (ref 11.0–15.0)
WBC: 6.3 10*3/uL (ref 3.8–10.8)

## 2015-10-11 LAB — COMPREHENSIVE METABOLIC PANEL
ALBUMIN: 3.5 g/dL — AB (ref 3.6–5.1)
ALK PHOS: 61 U/L (ref 33–115)
ALT: 11 U/L (ref 6–29)
AST: 12 U/L (ref 10–35)
BILIRUBIN TOTAL: 0.3 mg/dL (ref 0.2–1.2)
BUN: 12 mg/dL (ref 7–25)
CHLORIDE: 106 mmol/L (ref 98–110)
CO2: 24 mmol/L (ref 20–31)
CREATININE: 0.79 mg/dL (ref 0.50–1.10)
Calcium: 8.7 mg/dL (ref 8.6–10.2)
Glucose, Bld: 94 mg/dL (ref 65–99)
Potassium: 3.7 mmol/L (ref 3.5–5.3)
SODIUM: 139 mmol/L (ref 135–146)
TOTAL PROTEIN: 7.2 g/dL (ref 6.1–8.1)

## 2015-10-11 LAB — LIPID PANEL
CHOLESTEROL: 158 mg/dL (ref 125–200)
HDL: 36 mg/dL — ABNORMAL LOW (ref >=46)
LDL Cholesterol: 85 mg/dL (ref <130)
TRIGLYCERIDES: 186 mg/dL — AB (ref <150)
Total CHOL/HDL Ratio: 4.4 Ratio (ref <=5.0)
VLDL: 37 mg/dL — AB (ref <30)

## 2015-10-11 NOTE — Addendum Note (Signed)
Addended by: Mariea ClontsGREEN, Dallys Nowakowski D on: 10/11/2015 04:26 PM   Modules accepted: Orders

## 2015-10-12 LAB — HIV-1 RNA QUANT-NO REFLEX-BLD: HIV 1 RNA Quant: <20 copies/mL (ref <20)

## 2015-10-12 LAB — T-HELPER CELL (CD4) - (RCID CLINIC ONLY)
CD4 % Helper T Cell: 35 % (ref 33–55)
CD4 T CELL ABS: 720 /uL (ref 400–2700)

## 2015-10-12 LAB — URINE CYTOLOGY ANCILLARY ONLY
Chlamydia: NEGATIVE
Neisseria Gonorrhea: NEGATIVE

## 2015-10-13 LAB — RPR

## 2015-11-05 ENCOUNTER — Encounter: Payer: Self-pay | Admitting: Infectious Diseases

## 2015-11-22 ENCOUNTER — Encounter: Payer: Self-pay | Admitting: Infectious Diseases

## 2015-11-22 ENCOUNTER — Ambulatory Visit: Payer: Self-pay | Admitting: *Deleted

## 2015-11-22 ENCOUNTER — Ambulatory Visit (INDEPENDENT_AMBULATORY_CARE_PROVIDER_SITE_OTHER): Payer: Self-pay | Admitting: Infectious Diseases

## 2015-11-22 VITALS — BP 147/93 | HR 79 | Temp 98.4°F | Wt 285.0 lb

## 2015-11-22 DIAGNOSIS — G609 Hereditary and idiopathic neuropathy, unspecified: Secondary | ICD-10-CM

## 2015-11-22 DIAGNOSIS — M1990 Unspecified osteoarthritis, unspecified site: Secondary | ICD-10-CM

## 2015-11-22 DIAGNOSIS — B20 Human immunodeficiency virus [HIV] disease: Secondary | ICD-10-CM

## 2015-11-22 DIAGNOSIS — I10 Essential (primary) hypertension: Secondary | ICD-10-CM

## 2015-11-22 DIAGNOSIS — G4700 Insomnia, unspecified: Secondary | ICD-10-CM

## 2015-11-22 DIAGNOSIS — E669 Obesity, unspecified: Secondary | ICD-10-CM

## 2015-11-22 DIAGNOSIS — F411 Generalized anxiety disorder: Secondary | ICD-10-CM

## 2015-11-22 MED ORDER — EMTRICITAB-RILPIVIR-TENOFOV AF 200-25-25 MG PO TABS: 1.0000 | ORAL_TABLET | Freq: Every day | ORAL | Status: DC

## 2015-11-22 MED ORDER — DICLOFENAC SODIUM 1 % TD GEL: 1.0000 "application " | Freq: Four times a day (QID) | TRANSDERMAL | Status: AC

## 2015-11-22 MED ORDER — PREGABALIN 50 MG PO CAPS: 50.0000 mg | ORAL_CAPSULE | Freq: Every evening | ORAL | Status: DC | PRN

## 2015-11-22 NOTE — BH Specialist Note (Signed)
Counselor met with Anita Pacheco today as a warm hand off in the exam room.  Patient was oriented times four with good affect and dress.  Patient was alert and talkative.  Patient shared that she does not have a lot of support and should probably get some counseling to sort out how HIV has affected her and her relationships with others.  Counselor provided support and encouragement for patient.  Couselor also educated patient about counseling services and recommended that she make an appointment.  Patient made an appointment for a week out.   Rolena Infante, MA, [facility]

## 2015-11-22 NOTE — Assessment & Plan Note (Signed)
She will f/u with her new PCP

## 2015-11-22 NOTE — Assessment & Plan Note (Signed)
She is doing well.  Offered/refused condoms.  Will change her art to newer form.  Will see her back in 6 months.

## 2015-11-22 NOTE — Assessment & Plan Note (Signed)
Will refill her voltaren.

## 2015-11-22 NOTE — Progress Notes (Signed)
   Subjective:    Patient ID: Anita Pacheco, female    DOB: April 07, 1971, 45 y.o.   MRN: [id number]  HPI 45 yo F with HIV+, previously started on complera, HTN (on cozar) and anxiety.  PAP negative, 03-2015. [facility] written rx for OCPs by her PCP. Has not liked this, made her cycles longer. Quit.  Has missed some doses of cozaar- has been having dizzy episodes which she attributes to this. She has been taking at night. Still taking ASA, lyrica. Has cut down on lyrica due to decreased rx.  Having trouble with her glasses, difficulty reading. Has had visit with ophtho.  Still having broken sleep.  Taking complera without difficulty (not with food).   HIV 1 RNA QUANT (copies/mL)  Date Value  10/11/2015 <20  01/09/2015 <20  07/05/2014 <20   CD4 T CELL ABS (/uL)  Date Value  10/11/2015 720  01/09/2015 690  07/05/2014 560     Review of Systems  Constitutional: Negative for appetite change and unexpected weight change.  Gastrointestinal: Negative for diarrhea and constipation.  Genitourinary: Negative for difficulty urinating.  wt variable by 25# (290-315). Joint problems with treadmilll. Trying to find aquatics class.      Objective:   Physical Exam  Constitutional: She appears well-developed and well-nourished.  HENT:  Mouth/Throat: No oropharyngeal exudate.  Eyes: EOM are normal. Pupils are equal, round, and reactive to light.  Neck: Neck supple.  Cardiovascular: Normal rate.   Pulmonary/Chest: Effort normal and breath sounds normal. No respiratory distress.  Abdominal: Soft. Bowel sounds are normal. There is no tenderness. There is no rebound.  Lymphadenopathy:    She has no cervical adenopathy.       Assessment & Plan:

## 2015-11-22 NOTE — Assessment & Plan Note (Signed)
Will refill her lyrica 

## 2015-11-22 NOTE — Assessment & Plan Note (Signed)
She is working on diet and exercise

## 2015-11-23 ENCOUNTER — Other Ambulatory Visit: Payer: Self-pay | Admitting: *Deleted

## 2015-11-23 DIAGNOSIS — B20 Human immunodeficiency virus [HIV] disease: Secondary | ICD-10-CM

## 2015-11-23 MED ORDER — EMTRICITAB-RILPIVIR-TENOFOV AF 200-25-25 MG PO TABS: 1.0000 | ORAL_TABLET | Freq: Every day | ORAL | Status: DC

## 2016-04-17 ENCOUNTER — Other Ambulatory Visit: Payer: Self-pay

## 2016-04-30 ENCOUNTER — Ambulatory Visit: Payer: Self-pay | Admitting: Infectious Diseases

## 2016-05-08 ENCOUNTER — Ambulatory Visit: Payer: Self-pay

## 2016-05-12 ENCOUNTER — Encounter: Payer: Self-pay | Admitting: Infectious Diseases

## 2016-05-13 ENCOUNTER — Other Ambulatory Visit (INDEPENDENT_AMBULATORY_CARE_PROVIDER_SITE_OTHER): Payer: Self-pay

## 2016-05-13 DIAGNOSIS — B20 Human immunodeficiency virus [HIV] disease: Secondary | ICD-10-CM

## 2016-05-13 LAB — COMPREHENSIVE METABOLIC PANEL
ALK PHOS: 59 U/L (ref 33–115)
ALT: 12 U/L (ref 6–29)
AST: 15 U/L (ref 10–35)
Albumin: 3.7 g/dL (ref 3.6–5.1)
BUN: 13 mg/dL (ref 7–25)
CALCIUM: 9.1 mg/dL (ref 8.6–10.2)
CHLORIDE: 103 mmol/L (ref 98–110)
CO2: 27 mmol/L (ref 20–31)
Creat: 0.93 mg/dL (ref 0.50–1.10)
GLUCOSE: 95 mg/dL (ref 65–99)
POTASSIUM: 3.7 mmol/L (ref 3.5–5.3)
Sodium: 138 mmol/L (ref 135–146)
Total Bilirubin: 0.3 mg/dL (ref 0.2–1.2)
Total Protein: 7.1 g/dL (ref 6.1–8.1)

## 2016-05-13 LAB — CBC
HCT: 34.6 % — ABNORMAL LOW (ref 35.0–45.0)
Hemoglobin: 11.1 g/dL — ABNORMAL LOW (ref 11.7–15.5)
MCH: 26.2 pg — AB (ref 27.0–33.0)
MCHC: 32.1 g/dL (ref 32.0–36.0)
MCV: 81.6 fL (ref 80.0–100.0)
MPV: 9.7 fL (ref 7.5–12.5)
PLATELETS: 297 10*3/uL (ref 140–400)
RBC: 4.24 MIL/uL (ref 3.80–5.10)
RDW: 16.5 % — ABNORMAL HIGH (ref 11.0–15.0)
WBC: 7.5 10*3/uL (ref 3.8–10.8)

## 2016-05-14 LAB — T-HELPER CELL (CD4) - (RCID CLINIC ONLY)
CD4 % Helper T Cell: 29 % — ABNORMAL LOW (ref 33–55)
CD4 T CELL ABS: 740 /uL (ref 400–2700)

## 2016-05-16 LAB — HIV-1 RNA QUANT-NO REFLEX-BLD

## 2016-05-27 ENCOUNTER — Ambulatory Visit: Payer: Self-pay | Admitting: Infectious Diseases

## 2016-06-11 ENCOUNTER — Encounter: Payer: Self-pay | Admitting: Infectious Diseases

## 2016-06-11 ENCOUNTER — Ambulatory Visit: Payer: Self-pay | Admitting: Infectious Diseases

## 2016-06-11 ENCOUNTER — Other Ambulatory Visit: Payer: Self-pay | Admitting: Pharmacist

## 2016-06-11 ENCOUNTER — Ambulatory Visit (INDEPENDENT_AMBULATORY_CARE_PROVIDER_SITE_OTHER): Payer: Self-pay | Admitting: Infectious Diseases

## 2016-06-11 VITALS — BP 175/97 | HR 97 | Temp 98.2°F | Ht 68.0 in | Wt 301.8 lb

## 2016-06-11 DIAGNOSIS — J4520 Mild intermittent asthma, uncomplicated: Secondary | ICD-10-CM

## 2016-06-11 DIAGNOSIS — Z79899 Other long term (current) drug therapy: Secondary | ICD-10-CM

## 2016-06-11 DIAGNOSIS — B20 Human immunodeficiency virus [HIV] disease: Secondary | ICD-10-CM

## 2016-06-11 DIAGNOSIS — I10 Essential (primary) hypertension: Secondary | ICD-10-CM

## 2016-06-11 DIAGNOSIS — F411 Generalized anxiety disorder: Secondary | ICD-10-CM

## 2016-06-11 DIAGNOSIS — Z113 Encounter for screening for infections with a predominantly sexual mode of transmission: Secondary | ICD-10-CM

## 2016-06-11 MED ORDER — MOMETASONE FURO-FORMOTEROL FUM 100-5 MCG/ACT IN AERO
2.0000 | INHALATION_SPRAY | Freq: Two times a day (BID) | RESPIRATORY_TRACT | 5 refills | Status: DC
Start: 2016-06-11 — End: 2020-03-19

## 2016-06-11 MED ORDER — FLUTICASONE-SALMETEROL 250-50 MCG/DOSE IN AEPB: 1.0000 | INHALATION_SPRAY | Freq: Two times a day (BID) | RESPIRATORY_TRACT | 11 refills | Status: DC

## 2016-06-11 NOTE — Progress Notes (Signed)
   Subjective:    Patient ID: Anita Pacheco, female    DOB: 06/12/1971, 45 y.o.   MRN: [id number]  HPI 45 yo F with HIV+, previously started on complera --> odefsy, HTN (on norvasc, atenolol) and anxiety.  PAP negative, 03-2015. wsa written rx for OCPs by her PCP. Has been having trouble getting her BP regulated.  Has been doing well.  Gets little sleep. Working 2 jobs for ~ 1 year now. Has noted that she is getting confused/disoriented (over last 5 months).    HIV 1 RNA Quant (copies/mL)  Date Value  05/13/2016 <20  10/11/2015 <20  01/09/2015 <20   CD4 T Cell Abs (/uL)  Date Value  05/13/2016 740  10/11/2015 720  01/09/2015 690    Review of Systems  Constitutional: Negative for appetite change and unexpected weight change.  Respiratory: Negative for cough and shortness of breath.   Cardiovascular: Negative for chest pain.  Gastrointestinal: Negative for constipation and diarrhea.  Genitourinary: Negative for difficulty urinating.  Neurological: Negative for headaches.  Psychiatric/Behavioral: Positive for confusion and decreased concentration.       Objective:   Physical Exam  Constitutional: She is oriented to person, place, and time. She appears well-developed.  HENT:  Mouth/Throat: No oropharyngeal exudate.  Eyes: EOM are normal. Pupils are equal, round, and reactive to light.  Neck: Neck supple.  Cardiovascular: Normal rate, regular rhythm and normal heart sounds.   Pulmonary/Chest: Effort normal and breath sounds normal.  Abdominal: Soft. Bowel sounds are normal. There is no tenderness. There is no rebound.  Lymphadenopathy:    She has no cervical adenopathy.  Neurological: She is alert and oriented to person, place, and time. No cranial nerve deficit. She exhibits normal muscle tone. Coordination normal.      Assessment & Plan:

## 2016-06-11 NOTE — Assessment & Plan Note (Addendum)
Will have her seen by mental health in lab I believe her concentration problems are related to lack of sleep, anxiety, working 2 jobs.

## 2016-06-11 NOTE — Assessment & Plan Note (Addendum)
She is doing well on odefsy Will get flu and mening today Offered/refused condoms.  Will get her appt with Angelique Blonderenise for pap Will see her back in 6 months with labs first

## 2016-06-11 NOTE — Assessment & Plan Note (Signed)
Appreciate pharm help with getting her cheaper alternative to advair ($500/rx) She cannot afford the advair.

## 2016-06-11 NOTE — Assessment & Plan Note (Signed)
She will continue to f/u with Dr Julio Sickssei-Bonsu She will call us with names of her meds.

## 2016-06-12 ENCOUNTER — Encounter: Payer: Self-pay | Admitting: *Deleted

## 2016-06-13 ENCOUNTER — Other Ambulatory Visit: Payer: Self-pay | Admitting: Pharmacist

## 2016-06-13 ENCOUNTER — Ambulatory Visit (INDEPENDENT_AMBULATORY_CARE_PROVIDER_SITE_OTHER): Payer: Self-pay | Admitting: *Deleted

## 2016-06-13 ENCOUNTER — Ambulatory Visit: Payer: Self-pay | Admitting: Pharmacist

## 2016-06-13 DIAGNOSIS — G609 Hereditary and idiopathic neuropathy, unspecified: Secondary | ICD-10-CM

## 2016-06-13 DIAGNOSIS — Z113 Encounter for screening for infections with a predominantly sexual mode of transmission: Secondary | ICD-10-CM

## 2016-06-13 DIAGNOSIS — B20 Human immunodeficiency virus [HIV] disease: Secondary | ICD-10-CM

## 2016-06-13 DIAGNOSIS — Z124 Encounter for screening for malignant neoplasm of cervix: Secondary | ICD-10-CM

## 2016-06-13 MED ORDER — AMLODIPINE BESYLATE 10 MG PO TABS: 10.0000 mg | ORAL_TABLET | Freq: Every day | ORAL | 11 refills | Status: DC

## 2016-06-13 MED ORDER — ATENOLOL-CHLORTHALIDONE 50-25 MG PO TABS: 1.0000 | ORAL_TABLET | Freq: Every day | ORAL | Status: DC

## 2016-06-13 MED ORDER — EMTRICITAB-RILPIVIR-TENOFOV AF 200-25-25 MG PO TABS
1.0000 | ORAL_TABLET | Freq: Every day | ORAL | 3 refills | Status: DC
Start: 2016-06-13 — End: 2016-10-02

## 2016-06-13 MED ORDER — ATENOLOL-CHLORTHALIDONE 50-25 MG PO TABS: 1.0000 | ORAL_TABLET | Freq: Every day | ORAL | 6 refills | Status: DC

## 2016-06-13 MED ORDER — PREGABALIN 50 MG PO CAPS: 50.0000 mg | ORAL_CAPSULE | Freq: Every evening | ORAL | 3 refills | Status: DC | PRN

## 2016-06-13 NOTE — Patient Instructions (Signed)
  Your results will be ready in about a week.  I will send them to you via MyChart.  Thank you for coming to the Center for your care.  Happy Holidays!!  Angelique Blonderenise, RN

## 2016-06-13 NOTE — Progress Notes (Signed)
Subjective:     Ronald Gerre Sculltoyebi is a 45 y.o. woman who comes in today for a  pap smear only.  Previous abnormal Pap smears: no. Contraception: condoms.  Patient complained of Heavy 10-day menses.  Currently taking "prenatal" OTC vitamins due to OTC iron supplement caused constipation.  Patient for the past 3 years has been having low hemoglobin issues.   Objective:    LMP 06/01/2016 (Approximate)  Pelvic Exam:  Pap smear obtained.   Assessment:    Screening pap smear.   Plan:    Follow up in one year, or as indicated by Pap results.  Pt given educational materials re: HIV and women, self-esteem, BSE, nutrition and diet management, PAP smears and partner safety.

## 2016-06-13 NOTE — Addendum Note (Signed)
Addended by: Aggie CosierKUPPELWEISER, CASSIE L on: 06/13/2016 11:15 AM   Modules accepted: Orders

## 2016-06-13 NOTE — Progress Notes (Signed)
I am able to get a Dulera inhaler for Anita Pacheco through [employer] for free of charge.  She came by the office today to sign the application.  Will send in and call her to see if she receives next week.

## 2016-06-16 LAB — CERVICOVAGINAL ANCILLARY ONLY
CHLAMYDIA, DNA PROBE: NEGATIVE
Neisseria Gonorrhea: NEGATIVE

## 2016-06-17 LAB — CYTOLOGY - PAP: DIAGNOSIS: NEGATIVE

## 2016-06-18 ENCOUNTER — Encounter: Payer: Self-pay | Admitting: *Deleted

## 2016-06-19 ENCOUNTER — Ambulatory Visit: Payer: Self-pay

## 2016-06-19 DIAGNOSIS — F411 Generalized anxiety disorder: Secondary | ICD-10-CM

## 2016-06-19 NOTE — BH Specialist Note (Signed)
I met with Anita Pacheco for the first time in several years. She presents with incongruent affect - smiling and/or laughing at times while giving negative information. She reports losing track of time and that recently she drove here in [place] but didn't remember part of the trip. She reports chronic sleep deprivation, getting around 4 hours/night. I tried to give suggestions about it, including melatonin, cutting out looking at her digital reader (Kindle) 30 minutes before bed, drinking sleep inducing tea, and exercise during the day. She said she would give these a try. She was tangential and circumstantial at times, rambling off the subject. Plan to meet again in 2 weeks. Curley Spice, LCSW

## 2016-07-03 ENCOUNTER — Ambulatory Visit: Payer: Self-pay

## 2016-07-17 ENCOUNTER — Ambulatory Visit: Payer: Self-pay

## 2016-08-07 ENCOUNTER — Ambulatory Visit: Payer: Self-pay

## 2016-08-14 ENCOUNTER — Ambulatory Visit: Payer: Self-pay

## 2016-08-14 DIAGNOSIS — F331 Major depressive disorder, recurrent, moderate: Secondary | ICD-10-CM

## 2016-08-14 NOTE — BH Specialist Note (Signed)
Anita Pacheco talked about her frustration with her family and how they demand things from her. She is working on setting limits with them, so I gave her a handout on learning to say no. I pointed out that it sounds like she is making some progress with this, but that she still seems to be caught up in her cousins' opinions. Plan to meet again in 2 weeks. Franne FortsKenny Keyetta Hollingworth, LCSW

## 2016-08-28 ENCOUNTER — Ambulatory Visit: Payer: Self-pay

## 2016-08-28 DIAGNOSIS — F4323 Adjustment disorder with mixed anxiety and depressed mood: Secondary | ICD-10-CM

## 2016-08-28 NOTE — BH Specialist Note (Signed)
Anita Pacheco spent her session talking about her extended family and their ongoing expectations of her. She said she is considering moving to [place] at her brother's request to help with a newborn they are expecting in June. She said she told someone in the family and they told her they didn't think she should do that.  I told her that was her decision and not that family member's. Plan to meet again in 2 weeks. [name], LCSW

## 2016-09-11 ENCOUNTER — Ambulatory Visit: Payer: Self-pay

## 2016-09-11 DIAGNOSIS — F4323 Adjustment disorder with mixed anxiety and depressed mood: Secondary | ICD-10-CM

## 2016-09-11 NOTE — BH Specialist Note (Signed)
Joan was late for her appointment, saying that her day has not been going well. She did not elaborate. She is struggling with a decision to move to [place], [place], to live with or near her brother, whose wife is pregnant. They have encouraged her to consider moving there. 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 She admits to hating change and doesn't look forward to having to get to know a new doctor. Plan to meet again in 2 weeks. Franne FortsKenny Shandel Busic, LCSW

## 2016-09-25 ENCOUNTER — Ambulatory Visit: Payer: Self-pay

## 2016-09-25 DIAGNOSIS — F4323 Adjustment disorder with mixed anxiety and depressed mood: Secondary | ICD-10-CM

## 2016-09-25 NOTE — BH Specialist Note (Signed)
Anita Pacheco continues to stress about meeting family obligations but simultaneously taking care of herself. She is still seriously considering moving to [place] where her brother lives. She said her daughter has agreed to join her in this move. Plan to meet again in one week. Franne FortsKenny Maclane Holloran, LCSW

## 2016-10-02 ENCOUNTER — Ambulatory Visit: Payer: Self-pay

## 2016-10-02 ENCOUNTER — Encounter: Payer: Self-pay | Admitting: Infectious Diseases

## 2016-10-02 ENCOUNTER — Other Ambulatory Visit: Payer: Self-pay | Admitting: *Deleted

## 2016-10-02 DIAGNOSIS — F4323 Adjustment disorder with mixed anxiety and depressed mood: Secondary | ICD-10-CM

## 2016-10-02 DIAGNOSIS — B20 Human immunodeficiency virus [HIV] disease: Secondary | ICD-10-CM

## 2016-10-02 MED ORDER — EMTRICITAB-RILPIVIR-TENOFOV AF 200-25-25 MG PO TABS: 1.0000 | ORAL_TABLET | Freq: Every day | ORAL | 5 refills | Status: DC

## 2016-10-02 NOTE — BH Specialist Note (Signed)
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 It was initially going to be for 2 weeks, but now looks like it will be 6 weeks. I did my best to convince her that it is not her responsibility to work this entire time and to tell the patient's daughter that she is not available for the last 4 weeks. I reminded her that there are lots of CNAs in [place].  Plan to meet again in 2 weeks. Franne Forts, LCSW

## 2016-10-16 ENCOUNTER — Ambulatory Visit (INDEPENDENT_AMBULATORY_CARE_PROVIDER_SITE_OTHER): Payer: Self-pay

## 2016-10-16 DIAGNOSIS — F4323 Adjustment disorder with mixed anxiety and depressed mood: Secondary | ICD-10-CM

## 2016-10-16 NOTE — BH Specialist Note (Signed)
Anita Pacheco was in a fairly good mood today and talked about how she is learning to set limits with some of her extended family. I supported her and praised her for doing this. I also informed her of my leaving next month. Plan to meet again in 2 weeks. Franne Forts, LCSW

## 2016-10-30 ENCOUNTER — Ambulatory Visit: Payer: Self-pay

## 2016-11-26 ENCOUNTER — Other Ambulatory Visit (INDEPENDENT_AMBULATORY_CARE_PROVIDER_SITE_OTHER): Payer: Self-pay

## 2016-11-26 DIAGNOSIS — F411 Generalized anxiety disorder: Secondary | ICD-10-CM

## 2016-11-26 DIAGNOSIS — I10 Essential (primary) hypertension: Secondary | ICD-10-CM

## 2016-11-26 DIAGNOSIS — Z113 Encounter for screening for infections with a predominantly sexual mode of transmission: Secondary | ICD-10-CM

## 2016-11-26 DIAGNOSIS — B20 Human immunodeficiency virus [HIV] disease: Secondary | ICD-10-CM

## 2016-11-26 DIAGNOSIS — Z79899 Other long term (current) drug therapy: Secondary | ICD-10-CM

## 2016-11-26 LAB — COMPREHENSIVE METABOLIC PANEL
ALBUMIN: 3.5 g/dL — AB (ref 3.6–5.1)
ALK PHOS: 53 U/L (ref 33–115)
ALT: 11 U/L (ref 6–29)
AST: 14 U/L (ref 10–35)
BILIRUBIN TOTAL: 0.2 mg/dL (ref 0.2–1.2)
BUN: 21 mg/dL (ref 7–25)
CHLORIDE: 102 mmol/L (ref 98–110)
CO2: 26 mmol/L (ref 20–31)
CREATININE: 0.99 mg/dL (ref 0.50–1.10)
Calcium: 9.2 mg/dL (ref 8.6–10.2)
Glucose, Bld: 148 mg/dL — ABNORMAL HIGH (ref 65–99)
Potassium: 3 mmol/L — ABNORMAL LOW (ref 3.5–5.3)
SODIUM: 139 mmol/L (ref 135–146)
TOTAL PROTEIN: 7 g/dL (ref 6.1–8.1)

## 2016-11-26 LAB — CBC
HCT: 35 % (ref 35.0–45.0)
HEMOGLOBIN: 11 g/dL — AB (ref 11.7–15.5)
MCH: 26.1 pg — AB (ref 27.0–33.0)
MCHC: 31.4 g/dL — ABNORMAL LOW (ref 32.0–36.0)
MCV: 82.9 fL (ref 80.0–100.0)
MPV: 10 fL (ref 7.5–12.5)
Platelets: 278 10*3/uL (ref 140–400)
RBC: 4.22 MIL/uL (ref 3.80–5.10)
RDW: 16.2 % — ABNORMAL HIGH (ref 11.0–15.0)
WBC: 6.5 10*3/uL (ref 3.8–10.8)

## 2016-11-26 LAB — LIPID PANEL
CHOLESTEROL: 185 mg/dL (ref <200)
HDL: 33 mg/dL — ABNORMAL LOW (ref >50)
LDL Cholesterol: 109 mg/dL — ABNORMAL HIGH (ref <100)
TRIGLYCERIDES: 215 mg/dL — AB (ref <150)
Total CHOL/HDL Ratio: 5.6 Ratio — ABNORMAL HIGH (ref <5.0)
VLDL: 43 mg/dL — ABNORMAL HIGH (ref <30)

## 2016-11-26 LAB — TSH: TSH: 0.89 mIU/L

## 2016-11-27 LAB — RPR

## 2016-11-27 LAB — T-HELPER CELL (CD4) - (RCID CLINIC ONLY)
CD4 T CELL HELPER: 37 % (ref 33–55)
CD4 T Cell Abs: 860 /uL (ref 400–2700)

## 2016-11-28 ENCOUNTER — Telehealth: Payer: Self-pay

## 2016-11-28 LAB — HIV-1 RNA QUANT-NO REFLEX-BLD
HIV 1 RNA Quant: <20 copies/mL
HIV-1 RNA QUANT, LOG: NOT DETECTED {Log_copies}/mL

## 2016-11-28 NOTE — Telephone Encounter (Signed)
Called to inform Pt that she needs to come in to have potassium labs drawn. Pt did not answer and there was no option for VM.

## 2016-12-03 ENCOUNTER — Other Ambulatory Visit: Payer: Self-pay

## 2016-12-03 DIAGNOSIS — B20 Human immunodeficiency virus [HIV] disease: Secondary | ICD-10-CM

## 2016-12-03 LAB — CBC WITH DIFFERENTIAL/PLATELET
BASOS PCT: 0 %
Basophils Absolute: 0 cells/uL (ref 0–200)
EOS ABS: 132 {cells}/uL (ref 15–500)
EOS PCT: 2 %
HCT: 37 % (ref 35.0–45.0)
Hemoglobin: 11.9 g/dL (ref 11.7–15.5)
LYMPHS PCT: 41 %
Lymphs Abs: 2706 cells/uL (ref 850–3900)
MCH: 26.9 pg — ABNORMAL LOW (ref 27.0–33.0)
MCHC: 32.2 g/dL (ref 32.0–36.0)
MCV: 83.7 fL (ref 80.0–100.0)
MPV: 9.5 fL (ref 7.5–12.5)
Monocytes Absolute: 660 cells/uL (ref 200–950)
Monocytes Relative: 10 %
NEUTROS ABS: 3102 {cells}/uL (ref 1500–7800)
Neutrophils Relative %: 47 %
Platelets: 257 10*3/uL (ref 140–400)
RBC: 4.42 MIL/uL (ref 3.80–5.10)
RDW: 15.9 % — AB (ref 11.0–15.0)
WBC: 6.6 10*3/uL (ref 3.8–10.8)

## 2016-12-03 NOTE — Telephone Encounter (Signed)
i'd get a CBC, CMP, TSH, CD4, HIV RNA thanks

## 2016-12-03 NOTE — Telephone Encounter (Signed)
Patient returned call about labs and said she will come today for repeat CMP. She advised she is extremely tired and has some shortness of breath with tightness at times. Advised her she may need to come in to the ED if it is that bad. She was tearful about feeling bad for so long. Advised her will let the doctor know and give her a call back if he responds before she gets here. She is coming for lab today at 145 pm.   Advised her if she gets worse she can always call 911.

## 2016-12-04 ENCOUNTER — Other Ambulatory Visit: Payer: Self-pay | Admitting: *Deleted

## 2016-12-04 LAB — COMPLETE METABOLIC PANEL WITH GFR
ALT: 10 U/L (ref 6–29)
AST: 13 U/L (ref 10–35)
Albumin: 3.7 g/dL (ref 3.6–5.1)
Alkaline Phosphatase: 58 U/L (ref 33–115)
BILIRUBIN TOTAL: 0.3 mg/dL (ref 0.2–1.2)
BUN: 25 mg/dL (ref 7–25)
CALCIUM: 9.1 mg/dL (ref 8.6–10.2)
CO2: 24 mmol/L (ref 20–31)
CREATININE: 1.08 mg/dL (ref 0.50–1.10)
Chloride: 102 mmol/L (ref 98–110)
GFR, EST AFRICAN AMERICAN: 71 mL/min (ref >=60)
GFR, EST NON AFRICAN AMERICAN: 62 mL/min (ref >=60)
Glucose, Bld: 81 mg/dL (ref 65–99)
Potassium: 3.3 mmol/L — ABNORMAL LOW (ref 3.5–5.3)
Sodium: 139 mmol/L (ref 135–146)
TOTAL PROTEIN: 7.5 g/dL (ref 6.1–8.1)

## 2016-12-04 LAB — TSH: TSH: 1.07 m[IU]/L

## 2016-12-04 LAB — T-HELPER CELL (CD4) - (RCID CLINIC ONLY)
CD4 T CELL ABS: 950 /uL (ref 400–2700)
CD4 T CELL HELPER: 33 % (ref 33–55)

## 2016-12-04 MED ORDER — POTASSIUM CHLORIDE ER 10 MEQ PO TBCR
40.0000 meq | EXTENDED_RELEASE_TABLET | ORAL | 0 refills | Status: DC
Start: 2016-12-04 — End: 2016-12-17

## 2016-12-04 NOTE — Telephone Encounter (Signed)
Per Dr Ninetta LightsHatcher sent in potassium and called patient to advise it was sent to the pharmacy and had to leave a message for her to call the office for lab appt next week.

## 2016-12-05 LAB — HIV-1 RNA QUANT-NO REFLEX-BLD
HIV 1 RNA QUANT: NOT DETECTED {copies}/mL
HIV-1 RNA QUANT, LOG: NOT DETECTED {Log_copies}/mL

## 2016-12-10 ENCOUNTER — Ambulatory Visit (INDEPENDENT_AMBULATORY_CARE_PROVIDER_SITE_OTHER): Payer: Self-pay | Admitting: Infectious Diseases

## 2016-12-10 ENCOUNTER — Encounter: Payer: Self-pay | Admitting: Infectious Diseases

## 2016-12-10 VITALS — BP 150/88 | HR 76 | Temp 98.4°F | Wt 294.0 lb

## 2016-12-10 DIAGNOSIS — Z113 Encounter for screening for infections with a predominantly sexual mode of transmission: Secondary | ICD-10-CM

## 2016-12-10 DIAGNOSIS — I10 Essential (primary) hypertension: Secondary | ICD-10-CM

## 2016-12-10 DIAGNOSIS — N63 Unspecified lump in unspecified breast: Secondary | ICD-10-CM

## 2016-12-10 DIAGNOSIS — R739 Hyperglycemia, unspecified: Secondary | ICD-10-CM

## 2016-12-10 DIAGNOSIS — E669 Obesity, unspecified: Secondary | ICD-10-CM

## 2016-12-10 DIAGNOSIS — Z79899 Other long term (current) drug therapy: Secondary | ICD-10-CM

## 2016-12-10 DIAGNOSIS — IMO0001 Reserved for inherently not codable concepts without codable children: Secondary | ICD-10-CM

## 2016-12-10 DIAGNOSIS — B20 Human immunodeficiency virus [HIV] disease: Secondary | ICD-10-CM

## 2016-12-10 DIAGNOSIS — Z23 Encounter for immunization: Secondary | ICD-10-CM

## 2016-12-10 LAB — BASIC METABOLIC PANEL
BUN: 18 mg/dL (ref 7–25)
CO2: 29 mmol/L (ref 20–31)
Calcium: 9 mg/dL (ref 8.6–10.2)
Chloride: 101 mmol/L (ref 98–110)
Creat: 0.87 mg/dL (ref 0.50–1.10)
GLUCOSE: 89 mg/dL (ref 65–99)
POTASSIUM: 3.2 mmol/L — AB (ref 3.5–5.3)
Sodium: 139 mmol/L (ref 135–146)

## 2016-12-10 NOTE — Assessment & Plan Note (Signed)
Due for repeat mammo. 

## 2016-12-10 NOTE — Assessment & Plan Note (Signed)
Doing very well.  Continue odefsy Offered/refused condoms.  Mening vax rtc in 6 months

## 2016-12-10 NOTE — Assessment & Plan Note (Signed)
She will f/u with PCP Query if her fatigue and LE edema are from calcium channel blocker.

## 2016-12-10 NOTE — Assessment & Plan Note (Signed)
Will check her A1C with next blood draw.

## 2016-12-10 NOTE — Progress Notes (Signed)
   Subjective:    Patient ID: Anita Pacheco, female    DOB: 19-Aug-1970, 46 y.o.   MRN: [id number]  HPI 46 yo F with HIV+, previously started on complera --> odefsy, HTN (on norvasc, atenolol) and anxiety.  PAP negative, 05-2016. was written rx for OCPs by her PCP. At most recent lab her potassium was low. She was suplemented. Still slightly low.  Her prev Glc was 148, repeat was 81.  Takes ART in PM after eating.  Main problem recently is fatigue.   HIV 1 RNA Quant (copies/mL)  Date Value  12/03/2016 <20 NOT DETECTED  11/26/2016 <20 NOT DETECTED  05/13/2016 <20   CD4 T Cell Abs (/uL)  Date Value  12/03/2016 950  11/26/2016 860  05/13/2016 740    Review of Systems  Constitutional: Positive for fatigue. Negative for appetite change and unexpected weight change.  Respiratory: Negative for shortness of breath.   Cardiovascular: Positive for chest pain. Negative for leg swelling.  Gastrointestinal: Negative for constipation and diarrhea.  Genitourinary: Negative for difficulty urinating and menstrual problem.  Neurological: Negative for headaches.   Haven't been able to exercise recently due to fatigue.   Feels like she has a "grabbing in her heart". Can't breath with this. Lasts for a few seconds. No diaphoresis.   Stressed by moving, her children.     Objective:   Physical Exam  Constitutional: She appears well-developed and well-nourished.  HENT:  Mouth/Throat: No oropharyngeal exudate.  Eyes: EOM are normal. Pupils are equal, round, and reactive to light.  Neck: Neck supple.  Cardiovascular: Normal rate, regular rhythm and normal heart sounds.   Pulmonary/Chest: Effort normal and breath sounds normal.  Abdominal: Soft. Bowel sounds are normal. There is no tenderness. There is no rebound.  Musculoskeletal: She exhibits edema.  Lymphadenopathy:    She has no cervical adenopathy.  Psychiatric: She exhibits a depressed mood.      Assessment & Plan:

## 2016-12-10 NOTE — Addendum Note (Signed)
Addended by: Lurlean LeydenPOOLE, Letecia Arps F on: 12/10/2016 04:22 PM   Modules accepted: Orders

## 2016-12-10 NOTE — Assessment & Plan Note (Signed)
Encouraged diet and exercise.  Will check A1C at next visit.

## 2016-12-11 LAB — HEMOGLOBIN A1C
HEMOGLOBIN A1C: 6 % — AB (ref <5.7)
Mean Plasma Glucose: 126 mg/dL

## 2016-12-17 ENCOUNTER — Other Ambulatory Visit: Payer: Self-pay | Admitting: *Deleted

## 2016-12-17 DIAGNOSIS — E876 Hypokalemia: Secondary | ICD-10-CM

## 2016-12-17 MED ORDER — POTASSIUM CHLORIDE ER 10 MEQ PO TBCR: 40.0000 meq | EXTENDED_RELEASE_TABLET | Freq: Once | ORAL | 0 refills | Status: DC

## 2017-01-21 ENCOUNTER — Other Ambulatory Visit: Payer: Self-pay | Admitting: Infectious Diseases

## 2017-01-21 DIAGNOSIS — Z1231 Encounter for screening mammogram for malignant neoplasm of breast: Secondary | ICD-10-CM

## 2017-01-22 ENCOUNTER — Ambulatory Visit: Payer: Self-pay

## 2017-01-29 ENCOUNTER — Other Ambulatory Visit: Payer: Self-pay | Admitting: *Deleted

## 2017-01-29 DIAGNOSIS — B20 Human immunodeficiency virus [HIV] disease: Secondary | ICD-10-CM

## 2017-01-29 MED ORDER — EMTRICITAB-RILPIVIR-TENOFOV AF 200-25-25 MG PO TABS: 1.0000 | ORAL_TABLET | Freq: Every day | ORAL | 5 refills | Status: DC

## 2017-04-20 ENCOUNTER — Other Ambulatory Visit: Payer: Self-pay | Admitting: *Deleted

## 2017-04-20 DIAGNOSIS — B20 Human immunodeficiency virus [HIV] disease: Secondary | ICD-10-CM

## 2017-04-20 MED ORDER — EMTRICITAB-RILPIVIR-TENOFOV AF 200-25-25 MG PO TABS: 1.0000 | ORAL_TABLET | Freq: Every day | ORAL | 5 refills | Status: DC

## 2017-05-28 ENCOUNTER — Other Ambulatory Visit: Payer: Self-pay

## 2017-05-28 DIAGNOSIS — Z79899 Other long term (current) drug therapy: Secondary | ICD-10-CM

## 2017-05-28 DIAGNOSIS — Z113 Encounter for screening for infections with a predominantly sexual mode of transmission: Secondary | ICD-10-CM

## 2017-05-28 DIAGNOSIS — B20 Human immunodeficiency virus [HIV] disease: Secondary | ICD-10-CM

## 2017-05-29 LAB — COMPREHENSIVE METABOLIC PANEL
AG RATIO: 1.1 (calc) (ref 1.0–2.5)
ALBUMIN MSPROF: 3.6 g/dL (ref 3.6–5.1)
ALT: 10 U/L (ref 6–29)
AST: 12 U/L (ref 10–35)
Alkaline phosphatase (APISO): 56 U/L (ref 33–115)
BUN: 13 mg/dL (ref 7–25)
CHLORIDE: 102 mmol/L (ref 98–110)
CO2: 24 mmol/L (ref 20–32)
CREATININE: 0.76 mg/dL (ref 0.50–1.10)
Calcium: 8.9 mg/dL (ref 8.6–10.2)
GLOBULIN: 3.4 g/dL (ref 1.9–3.7)
GLUCOSE: 91 mg/dL (ref 65–99)
POTASSIUM: 3.4 mmol/L — AB (ref 3.5–5.3)
SODIUM: 137 mmol/L (ref 135–146)
Total Bilirubin: 0.5 mg/dL (ref 0.2–1.2)
Total Protein: 7 g/dL (ref 6.1–8.1)

## 2017-05-29 LAB — URINE CYTOLOGY ANCILLARY ONLY
Chlamydia: NEGATIVE
Neisseria Gonorrhea: NEGATIVE

## 2017-05-29 LAB — CBC
HCT: 33.5 % — ABNORMAL LOW (ref 35.0–45.0)
HEMOGLOBIN: 11 g/dL — AB (ref 11.7–15.5)
MCH: 26.4 pg — AB (ref 27.0–33.0)
MCHC: 32.8 g/dL (ref 32.0–36.0)
MCV: 80.5 fL (ref 80.0–100.0)
MPV: 10.6 fL (ref 7.5–12.5)
Platelets: 231 10*3/uL (ref 140–400)
RBC: 4.16 10*6/uL (ref 3.80–5.10)
RDW: 14.8 % (ref 11.0–15.0)
WBC: 5.6 10*3/uL (ref 3.8–10.8)

## 2017-05-29 LAB — LIPID PANEL
CHOL/HDL RATIO: 4.4 (calc) (ref <5.0)
Cholesterol: 189 mg/dL (ref <200)
HDL: 43 mg/dL — AB (ref >50)
LDL Cholesterol (Calc): 126 mg/dL (calc) — ABNORMAL HIGH
NON-HDL CHOLESTEROL (CALC): 146 mg/dL — AB (ref <130)
Triglycerides: 92 mg/dL (ref <150)

## 2017-05-29 LAB — T-HELPER CELL (CD4) - (RCID CLINIC ONLY)
CD4 % Helper T Cell: 36 % (ref 33–55)
CD4 T Cell Abs: 730 /uL (ref 400–2700)

## 2017-05-29 LAB — RPR: RPR: NONREACTIVE

## 2017-06-01 LAB — HIV-1 RNA QUANT-NO REFLEX-BLD
HIV 1 RNA QUANT: NOT DETECTED {copies}/mL
HIV-1 RNA Quant, Log: <1.3 Log copies/mL

## 2017-06-17 ENCOUNTER — Ambulatory Visit: Payer: Self-pay | Admitting: Infectious Diseases

## 2017-07-08 ENCOUNTER — Ambulatory Visit (INDEPENDENT_AMBULATORY_CARE_PROVIDER_SITE_OTHER): Payer: Self-pay | Admitting: Infectious Diseases

## 2017-07-08 ENCOUNTER — Encounter: Payer: Self-pay | Admitting: Infectious Diseases

## 2017-07-08 VITALS — BP 167/101 | HR 81 | Temp 98.4°F | Ht 67.5 in | Wt 293.0 lb

## 2017-07-08 DIAGNOSIS — I10 Essential (primary) hypertension: Secondary | ICD-10-CM

## 2017-07-08 DIAGNOSIS — Z23 Encounter for immunization: Secondary | ICD-10-CM

## 2017-07-08 DIAGNOSIS — B20 Human immunodeficiency virus [HIV] disease: Secondary | ICD-10-CM

## 2017-07-08 DIAGNOSIS — Z6841 Body Mass Index (BMI) 40.0 and over, adult: Secondary | ICD-10-CM

## 2017-07-08 DIAGNOSIS — Z113 Encounter for screening for infections with a predominantly sexual mode of transmission: Secondary | ICD-10-CM

## 2017-07-08 DIAGNOSIS — Z79899 Other long term (current) drug therapy: Secondary | ICD-10-CM

## 2017-07-08 NOTE — Addendum Note (Signed)
Addended by: Mariea ClontsGREEN, Averlee Swartz D on: 07/08/2017 05:34 PM   Modules accepted: Orders

## 2017-07-08 NOTE — Assessment & Plan Note (Signed)
Will give her flu shot Next mening vax Continue odefsy Offered/refused condoms.  Will get her PAP, mammo Will see her back in 9 months.

## 2017-07-08 NOTE — Assessment & Plan Note (Signed)
Encouraged her to lose wt, diet and exercise.

## 2017-07-08 NOTE — Progress Notes (Signed)
   Subjective:    Patient ID: Anita Pacheco, female    DOB: Sep 13, 1970, 47 y.o.   MRN: [id number]  HPI 47yo F with HIV+, previously started on complera --> odefsy, HTN (on norvasc, atenolol) and anxiety.  PAP negative, 05-2016. was written rx for OCPs by her PCP.  HIV 1 RNA Quant (copies/mL)  Date Value  05/28/2017 <20 NOT DETECTED  12/03/2016 <20 NOT DETECTED  11/26/2016 <20 NOT DETECTED   CD4 T Cell Abs (/uL)  Date Value  05/28/2017 730  12/03/2016 950  11/26/2016 860   In clinic today she is hypertensive. Did not take rx for last week. "A little stressed".  Daughter is "wearing me out" trying to eat better.   Review of Systems  Constitutional: Negative for appetite change, chills, fever and unexpected weight change.  Respiratory: Negative for shortness of breath.   Cardiovascular: Negative for chest pain.  Neurological: Negative for headaches.  had episode of CP yesterday related to emotional distress.  Has not had mammo or pap in last year.  Please see HPI. All other systems reviewed and negative.      Objective:   Physical Exam  Constitutional: She appears well-developed and well-nourished.  HENT:  Mouth/Throat: No oropharyngeal exudate.  Eyes: EOM are normal. Pupils are equal, round, and reactive to light.  Neck: Neck supple.  Cardiovascular: Normal rate, regular rhythm and normal heart sounds.  Pulmonary/Chest: Effort normal and breath sounds normal.  Abdominal: Bowel sounds are normal. There is no rigidity and no guarding.  Musculoskeletal: Normal range of motion. She exhibits edema (non-pitting. ).  Lymphadenopathy:    She has no cervical adenopathy.  Psychiatric: She has a normal mood and affect.      Assessment & Plan:

## 2017-07-08 NOTE — Assessment & Plan Note (Signed)
Encouraged her to resume her rx She asks about her longevity and I suggested that uncontrolled HTN is her biggest risk factor for shortened life span.

## 2017-07-09 ENCOUNTER — Other Ambulatory Visit: Payer: Self-pay | Admitting: Infectious Diseases

## 2017-07-09 ENCOUNTER — Telehealth: Payer: Self-pay | Admitting: *Deleted

## 2017-07-09 DIAGNOSIS — B20 Human immunodeficiency virus [HIV] disease: Secondary | ICD-10-CM

## 2017-07-09 LAB — COMPREHENSIVE METABOLIC PANEL
AG Ratio: 1 (calc) (ref 1.0–2.5)
ALT: 10 U/L (ref 6–29)
AST: 13 U/L (ref 10–35)
Albumin: 3.8 g/dL (ref 3.6–5.1)
Alkaline phosphatase (APISO): 61 U/L (ref 33–115)
BUN: 14 mg/dL (ref 7–25)
CO2: 25 mmol/L (ref 20–32)
CREATININE: 0.91 mg/dL (ref 0.50–1.10)
Calcium: 9.2 mg/dL (ref 8.6–10.2)
Chloride: 102 mmol/L (ref 98–110)
GLOBULIN: 3.8 g/dL — AB (ref 1.9–3.7)
GLUCOSE: 86 mg/dL (ref 65–99)
Potassium: 3.5 mmol/L (ref 3.5–5.3)
SODIUM: 137 mmol/L (ref 135–146)
TOTAL PROTEIN: 7.6 g/dL (ref 6.1–8.1)
Total Bilirubin: 0.3 mg/dL (ref 0.2–1.2)

## 2017-07-09 LAB — CBC

## 2017-07-09 LAB — LIPID PANEL
CHOL/HDL RATIO: 4.6 (calc) (ref <5.0)
Cholesterol: 206 mg/dL — ABNORMAL HIGH (ref <200)
HDL: 45 mg/dL — AB (ref >50)
LDL Cholesterol (Calc): 139 mg/dL (calc) — ABNORMAL HIGH
NON-HDL CHOLESTEROL (CALC): 161 mg/dL — AB (ref <130)
TRIGLYCERIDES: 104 mg/dL (ref <150)

## 2017-07-09 LAB — RPR: RPR: NONREACTIVE

## 2017-07-09 NOTE — Telephone Encounter (Signed)
Called patient and left a voicemail to return the call. Per Mariea ClontsKaren Green, lab tech she needs an appt next week to have a cbc repeated. The blood was clotted, so we were not able to get a good sample. She only needs a cbc and this is not urgent.

## 2017-07-09 NOTE — Addendum Note (Signed)
Addended by: Mariea ClontsGREEN, Ernisha Sorn D on: 07/09/2017 03:59 PM   Modules accepted: Orders

## 2017-07-09 NOTE — Telephone Encounter (Signed)
Patient called back and I scheduled her to come back on 07/14/17. Wendall MolaJacqueline Berline Pacheco

## 2017-07-10 ENCOUNTER — Telehealth: Payer: Self-pay | Admitting: *Deleted

## 2017-07-10 LAB — HIV-1 RNA QUANT-NO REFLEX-BLD
HIV 1 RNA QUANT: DETECTED {copies}/mL — AB
HIV-1 RNA QUANT, LOG: DETECTED {Log_copies}/mL — AB

## 2017-07-10 LAB — T-HELPER CELL (CD4) - (RCID CLINIC ONLY)
CD4 % Helper T Cell: 38 % (ref 33–55)
CD4 T Cell Abs: 1030 /uL (ref 400–2700)

## 2017-07-10 NOTE — Telephone Encounter (Signed)
[facility] and verified correct phone # and fax and mammogram scholarship faxed to [phone] (confirmation received). Also left a voice mail on [phone] to advise of fax. Patient aware that the [facility] will be contacting her to set up this appointment. Mammogram Scholarship form placed up front for scan into patient's chart.

## 2017-07-14 ENCOUNTER — Other Ambulatory Visit: Payer: Self-pay

## 2017-07-14 DIAGNOSIS — B20 Human immunodeficiency virus [HIV] disease: Secondary | ICD-10-CM

## 2017-07-14 LAB — CBC WITH DIFFERENTIAL/PLATELET
BASOS PCT: 0.4 %
Basophils Absolute: 28 cells/uL (ref 0–200)
Eosinophils Absolute: 7 cells/uL — ABNORMAL LOW (ref 15–500)
Eosinophils Relative: 0.1 %
HCT: 34.8 % — ABNORMAL LOW (ref 35.0–45.0)
HEMOGLOBIN: 11.4 g/dL — AB (ref 11.7–15.5)
Lymphs Abs: 2734 cells/uL (ref 850–3900)
MCH: 26.1 pg — ABNORMAL LOW (ref 27.0–33.0)
MCHC: 32.8 g/dL (ref 32.0–36.0)
MCV: 79.6 fL — ABNORMAL LOW (ref 80.0–100.0)
MONOS PCT: 7.9 %
MPV: 10.7 fL (ref 7.5–12.5)
NEUTROS ABS: 3770 {cells}/uL (ref 1500–7800)
Neutrophils Relative %: 53.1 %
PLATELETS: 253 10*3/uL (ref 140–400)
RBC: 4.37 10*6/uL (ref 3.80–5.10)
RDW: 14.4 % (ref 11.0–15.0)
TOTAL LYMPHOCYTE: 38.5 %
WBC mixed population: 561 cells/uL (ref 200–950)
WBC: 7.1 10*3/uL (ref 3.8–10.8)

## 2017-07-21 ENCOUNTER — Encounter: Payer: Self-pay | Admitting: Infectious Diseases

## 2017-07-21 ENCOUNTER — Other Ambulatory Visit: Payer: Self-pay | Admitting: Infectious Diseases

## 2017-07-21 DIAGNOSIS — Z1231 Encounter for screening mammogram for malignant neoplasm of breast: Secondary | ICD-10-CM

## 2017-07-24 ENCOUNTER — Ambulatory Visit
Admission: RE | Admit: 2017-07-24 | Discharge: 2017-07-24 | Disposition: A | Discharge status: Home / Self Care | Payer: No Typology Code available for payment source | Source: Ambulatory Visit | Attending: Internal Medicine | Admitting: Internal Medicine

## 2017-07-24 ENCOUNTER — Other Ambulatory Visit: Payer: Self-pay | Admitting: Internal Medicine

## 2017-07-24 DIAGNOSIS — M79604 Pain in right leg: Secondary | ICD-10-CM

## 2017-07-24 DIAGNOSIS — R609 Edema, unspecified: Secondary | ICD-10-CM

## 2017-07-27 ENCOUNTER — Encounter: Payer: Self-pay | Admitting: Vascular Surgery

## 2017-07-27 ENCOUNTER — Ambulatory Visit (INDEPENDENT_AMBULATORY_CARE_PROVIDER_SITE_OTHER): Payer: Self-pay | Admitting: Vascular Surgery

## 2017-07-27 VITALS — BP 148/100 | HR 95 | Temp 98.4°F | Resp 18 | Ht 67.5 in | Wt 288.0 lb

## 2017-07-27 DIAGNOSIS — I83892 Varicose veins of left lower extremities with other complications: Secondary | ICD-10-CM | POA: Insufficient documentation

## 2017-07-27 DIAGNOSIS — I83891 Varicose veins of right lower extremities with other complications: Secondary | ICD-10-CM

## 2017-07-27 NOTE — Progress Notes (Signed)
Subjective:     Patient ID: Anita Pacheco, female   DOB: [birth year], 47 y.o.   MRN: [id number]  HPI This 47 year old female was referred by Dr. Julio Sickssei-Bonsu for evaluation of painful varicosities in the right leg. 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 One week ago she developed pain in the distal right thigh over these varicosities and this has slightly improved since then with heat. Her job requires her to stand most of the day. She has no history of DVT or previous thrombophlebitis. She cannot wear elastic compression stockings because of the size of her legs. She had a ultrasound study which ruled out DVT on 07/24/2017.  Marland Kitchen. Past Medical History:  Diagnosis Date  . ANXIETY 07/22/2006  . ASTHMA 07/22/2006  . Breast mass 04/25/2011  . Carpal tunnel syndrome 07/22/2006  . HERPES ZOSTER 05/28/2010  . HIV (human immunodeficiency virus infection) (HCC)   . Hypertension   . HYPERTENSION 07/22/2006  . PERIPHERAL NEUROPATHY 07/03/2008    Social History   Tobacco Use  . Smoking status: Never Smoker  . Smokeless tobacco: Never Used  Substance Use Topics  . Alcohol use: No    History reviewed. No pertinent family history.  Allergies  Allergen Reactions  . Hydrocodone Hives and Itching    Excessive vomiting     Current Outpatient Medications:  .  albuterol (PROVENTIL,VENTOLIN) 90 MCG/ACT inhaler, Inhale 1-2 puffs into the lungs every 4 (four) hours as needed. wheezing, Disp: , Rfl:  .  amLODipine (NORVASC) 10 MG tablet, Take 1 tablet (10 mg total) by mouth daily., Disp: 30 tablet, Rfl: 11 .  aspirin 325 MG tablet, Take 325 mg by mouth every 4 (four) hours as needed. For arthritis pain, Disp: , Rfl:  .  atenolol-chlorthalidone (TENORETIC) 50-25 MG tablet, Take 1 tablet by mouth daily., Disp: 30 tablet, Rfl: 6 .  CEPHALEXIN PO, Take 500 mg by mouth 4 (four) times daily. , Disp: , Rfl:  .   diclofenac sodium (VOLTAREN) 1 % GEL, Apply 1 application topically 4 (four) times daily., Disp: 100 g, Rfl: 6 .  emtricitabine-rilpivir-tenofovir AF (ODEFSEY) 200-25-25 MG TABS tablet, Take 1 tablet by mouth daily with breakfast., Disp: 30 tablet, Rfl: 5 .  levocetirizine (XYZAL) 5 MG tablet, TAKE ONE TABLET EVERY EVENING, Disp: 31 tablet, Rfl: 0 .  mometasone-formoterol (DULERA) 100-5 MCG/ACT AERO, Inhale 2 puffs into the lungs 2 (two) times daily., Disp: 1 Inhaler, Rfl: 5 .  pregabalin (LYRICA) 50 MG capsule, Take 1 capsule (50 mg total) by mouth at bedtime as needed., Disp: 90 capsule, Rfl: 3 .  Prenatal Vit-Fe Fumarate-FA (PRENATAL PLUS/IRON) 27-1 MG TABS, Take 1 tablet by mouth daily., Disp: 180 each, Rfl: 0 .  sodium chloride (OCEAN) 0.65 % nasal spray, Place 1 spray into the nose as needed for congestion., Disp: 15 mL, Rfl: 12 .  potassium chloride (K-DUR) 10 MEQ tablet, Take 4 tablets (40 mEq total) by mouth once. Please follow up with PCP, Disp: 4 tablet, Rfl: 0  Vitals:   07/27/17 1402  BP: (!) 148/100  Pulse: 95  Resp: 18  Temp: 98.4 F (36.9 C)  TempSrc: Oral  SpO2: 98%  Weight: 288 lb (130.6 kg)  Height: 5' 7.5" (1.715 m)    Body mass index is 44.44 kg/m.         Review of Systems  Denies chest pain but does have mild dyspnea  on exertion. Has chronic obesity. HIV positive, hypertension, history of herpes zoster.    Objective:   Physical Exam BP (!) 148/100 (BP Location: Left Arm, Patient Position: Sitting, Cuff Size: Normal)   Pulse 95   Temp 98.4 F (36.9 C) (Oral)   Resp 18   Ht 5' 7.5" (1.715 m)   Wt 288 lb (130.6 kg)   SpO2 98%   BMI 44.44 kg/m    Gen.-alert and oriented x3 in no apparent distress-morbidly obese  HEENT normal for age Lungs no rhonchi or wheezing Cardiovascular regular rhythm no murmurs carotid pulses 3+ palpable no bruits audible Abdomen soft nontender no palpable masses-obese Musculoskeletal free of  major deformities Skin  clear -no rashes Neurologic normal Lower extremities 3+ femoral and dorsalis pedis pulses palpable bilaterally with no edema on the left 1+ edema on the right Right calf 3-5 cm larger in circumference than the left. Large bulging varicosities in the medial thigh and calf with thrombosed varicosities in the distal medial thigh on the right. No active ulcer noted.  Today I performed a bedside SonoSite ultrasound exam of the right leg which revealed a grossly enlarged right great saphenous vein supplying these painful varicosities. Varicosities in the calf do not appear thrombosed of the distal thigh does have thrombosed varicosities.      Assessment:     #1 painful varicosities right leg with recent episode of acute thrombophlebitis with thrombosed varix in right medial distal thigh #2 apparent gross reflux and a large right great saphenous vein supplying these painful varicosities and a Graciela Husbands #3 HIV-positive #4 hypertension #5 history of herpes zoster    Plan:         #1 long leg elastic compression stockings 20-30 mm gradient #2 elevate legs as much as possible #3 ibuprofen daily on a regular basis for pain #4 return in 3 months-we will see if the thrombophlebitis has resolved and also the results of the formal venous ultrasound which will be performed upon return in 3 months and then make a formal recommendation

## 2017-08-04 ENCOUNTER — Ambulatory Visit
Admission: RE | Admit: 2017-08-04 | Discharge: 2017-08-04 | Disposition: A | Discharge status: Home / Self Care | Payer: No Typology Code available for payment source | Source: Ambulatory Visit | Attending: Infectious Diseases | Admitting: Infectious Diseases

## 2017-08-04 DIAGNOSIS — Z1231 Encounter for screening mammogram for malignant neoplasm of breast: Secondary | ICD-10-CM

## 2017-08-07 ENCOUNTER — Other Ambulatory Visit: Payer: Self-pay | Admitting: Infectious Diseases

## 2017-08-07 DIAGNOSIS — R928 Other abnormal and inconclusive findings on diagnostic imaging of breast: Secondary | ICD-10-CM

## 2017-08-13 ENCOUNTER — Other Ambulatory Visit: Payer: No Typology Code available for payment source

## 2017-08-14 ENCOUNTER — Ambulatory Visit: Payer: No Typology Code available for payment source

## 2017-08-14 ENCOUNTER — Other Ambulatory Visit: Payer: Self-pay | Admitting: Infectious Diseases

## 2017-08-18 ENCOUNTER — Emergency Department (HOSPITAL_BASED_OUTPATIENT_CLINIC_OR_DEPARTMENT_OTHER)
Admit: 2017-08-18 | Discharge: 2017-08-18 | Disposition: A | Discharge status: Home / Self Care | Payer: No Typology Code available for payment source | Attending: Emergency Medicine | Admitting: Emergency Medicine

## 2017-08-18 ENCOUNTER — Other Ambulatory Visit: Payer: Self-pay

## 2017-08-18 ENCOUNTER — Emergency Department (HOSPITAL_COMMUNITY)
Admission: EM | Admit: 2017-08-18 | Discharge: 2017-08-18 | Disposition: A | Discharge status: Home / Self Care | Payer: Self-pay | Attending: Emergency Medicine | Admitting: Emergency Medicine

## 2017-08-18 ENCOUNTER — Encounter (HOSPITAL_COMMUNITY): Payer: Self-pay

## 2017-08-18 DIAGNOSIS — B20 Human immunodeficiency virus [HIV] disease: Secondary | ICD-10-CM | POA: Insufficient documentation

## 2017-08-18 DIAGNOSIS — I10 Essential (primary) hypertension: Secondary | ICD-10-CM | POA: Insufficient documentation

## 2017-08-18 DIAGNOSIS — I8001 Phlebitis and thrombophlebitis of superficial vessels of right lower extremity: Secondary | ICD-10-CM | POA: Insufficient documentation

## 2017-08-18 DIAGNOSIS — Z79899 Other long term (current) drug therapy: Secondary | ICD-10-CM | POA: Insufficient documentation

## 2017-08-18 DIAGNOSIS — M79609 Pain in unspecified limb: Secondary | ICD-10-CM

## 2017-08-18 MED ORDER — LIDOCAINE 5 % EX OINT: 1.0000 "application " | TOPICAL_OINTMENT | CUTANEOUS | 0 refills | Status: DC | PRN

## 2017-08-18 NOTE — ED Triage Notes (Signed)
Patient states that she was diagnosed with DVT of the right in January and patient is continuing to have swelling of the right lateral knee and calf and increased pain.

## 2017-08-18 NOTE — ED Provider Notes (Signed)
Patient placed in Quick Look pathway, seen and evaluated   Chief Complaint: right le swelling  HPI:   Right le ext edema x 2 days Dx w/ superficicial thrombophlebitis on 07/24/17  ROS: no chest pain or sob  Physical Exam:   Gen: No distress  Neuro: Awake and Alert  Skin: Warm    Focused Exam: right le ext edema with area of induration and hyperpigmented skin   Initiation of care has begun. The patient has been counseled on the process, plan, and necessity for staying for the completion/evaluation, and the remainder of the medical screening examination    Lorre NickAllen, Vyom Brass, MD 08/18/17 1428

## 2017-08-18 NOTE — ED Provider Notes (Signed)
[facility]-EMERGENCY DEPT Provider Note  CSN: [id number] Arrival date & time: 08/18/17 1247  Chief Complaint(s) No chief complaint on file.  HPI Anita Pacheco is a 47 y.o. female with history of HIV on antiretroviral medication, persistent superficial thrombophlebitis    Leg Pain    This is a new problem. Episode onset: 2 weeks.  The problem occurs constantly. The problem has been gradually worsening. The pain is present in the right lower leg. The quality of the pain is described as constant and pounding. The pain is moderate. Pertinent negatives include no numbness. Treatments tried: Keflex (completed a course 2/14)  The treatment provided mild relief. There has been no history of extremity trauma.   This is typical of her thrombophlebitis. Patient has been using warm compresses, NSAIDs and aspirin. Sent here by PCP to rule out DVT.  Past Medical History Past Medical History:  Diagnosis Date  . ANXIETY 07/22/2006  . ASTHMA 07/22/2006  . Breast mass 04/25/2011  . Carpal tunnel syndrome 07/22/2006  . HERPES ZOSTER 05/28/2010  . HIV (human immunodeficiency virus infection) (HCC)   . Hypertension   . HYPERTENSION 07/22/2006  . PERIPHERAL NEUROPATHY 07/03/2008   Patient Active Problem List   Diagnosis Date Noted  . Varicose veins of right lower extremity with complications 07/27/2017  . Hyperglycemia 12/10/2016  . Insomnia 01/24/2015  . Grief reaction 04/20/2013  . S/P tympanostomy tube placement 10/19/2012  . Abnormality of gait 10/05/2012  . Iron deficiency anemia 06/08/2012  . Epistaxis 06/08/2012  . Breast mass 04/25/2011  . HERPES ZOSTER 05/28/2010  . 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 Obesity 05/03/2007  . Anxiety state 07/22/2006  . CARPAL TUNNEL SYNDROME 07/22/2006  . Essential hypertension 07/22/2006  . ALLERGIC RHINITIS 07/22/2006  . Asthma 07/22/2006  . Human immunodeficiency  virus (HIV) disease (HCC) 01/19/2006   Home Medication(s) Prior to Admission medications   Medication Sig Start Date End Date Taking? Authorizing Provider  amLODipine (NORVASC) 10 MG tablet Take 1 tablet (10 mg total) by mouth daily. 06/13/16  Yes Ginnie Smart, MD  aspirin 325 MG tablet Take 325 mg by mouth every 4 (four) hours as needed. For arthritis pain   Yes [provider]  emtricitabine-rilpivir-tenofovir AF (ODEFSEY) 200-25-25 MG TABS tablet Take 1 tablet by mouth daily with breakfast. 04/20/17  Yes Ginnie Smart, MD  fluticasone (FLONASE) 50 MCG/ACT nasal spray Place 1 spray into both nostrils daily as needed for allergies or rhinitis.   Yes [provider]  levocetirizine (XYZAL) 5 MG tablet TAKE ONE TABLET EVERY EVENING Patient taking differently: TAKE ONE TABLET PO EVERY DAY AS NEEDED FOR ALLERGIES 04/21/13  Yes Ginnie Smart, MD  mometasone-formoterol (DULERA) 100-5 MCG/ACT AERO Inhale 2 puffs into the lungs 2 (two) times daily. 06/11/16  Yes Daiva Eves, Lisette Grinder, MD  pregabalin (LYRICA) 50 MG capsule Take 1 capsule (50 mg total) by mouth at bedtime as needed. Patient taking differently: Take 50 mg by mouth at bedtime as needed (pain).  06/13/16  Yes Ginnie Smart, MD  Prenatal Vit-Fe Fumarate-FA (PRENATAL PLUS/IRON) 27-1 MG TABS Take 1 tablet by mouth daily. 04/20/13  Yes Ginnie Smart, MD  albuterol (PROVENTIL,VENTOLIN) 90 MCG/ACT inhaler Inhale 1-2 puffs into the lungs every 4 (four) hours as needed. wheezing    [provider]  atenolol-chlorthalidone (TENORETIC) 50-25 MG tablet Take 1 tablet by mouth daily. 06/13/16   Ginnie Smart, MD  diclofenac sodium (  VOLTAREN) 1 % GEL Apply 1 application topically 4 (four) times daily. Patient taking differently: Apply 1 application topically 4 (four) times daily as needed (pain).  11/22/15   Ginnie Smart, MD  lidocaine (XYLOCAINE) 5 % ointment Apply 1 application topically as  needed. 08/18/17   Cardama, Amadeo Garnet, MD  potassium chloride (K-DUR) 10 MEQ tablet Take 4 tablets (40 mEq total) by mouth once. Please follow up with PCP 12/17/16 12/17/16  Ginnie Smart, MD  sodium chloride (OCEAN) 0.65 % nasal spray Place 1 spray into the nose as needed for congestion. Patient not taking: Reported on 08/18/2017 06/08/12   Priscella Mann, DO                                                                                                                                    Past Surgical History Past Surgical History:  Procedure Laterality Date  . STAPEDES SURGERY     Family History Family History  Problem Relation Age of Onset  . Cancer Father     Social History Social History   Tobacco Use  . Smoking status: Never Smoker  . Smokeless tobacco: Never Used  Substance Use Topics  . Alcohol use: No  . Drug use: No   Allergies Hydrocodone  Review of Systems Review of Systems  Neurological: Negative for numbness.   All other systems are reviewed and are negative for acute change except as noted in the HPI  Physical Exam Vital Signs  I have reviewed the triage vital signs BP (!) 167/98   Pulse 76   Temp 98.2 F (36.8 C) (Oral)   Resp 15   Ht 5\' 7"  (1.702 m)   Wt 128.8 kg (284 lb)   LMP 08/18/2017   SpO2 100%   BMI 44.48 kg/m   All other systems are reviewed and are negative for acute change except as noted in the HPI  Physical Exam  Constitutional: She is oriented to person, place, and time. She appears well-developed and well-nourished. No distress.  Obese  HENT:  Head: Normocephalic and atraumatic.  Right Ear: External ear normal.  Left Ear: External ear normal.  Nose: Nose normal.  Eyes: Conjunctivae and EOM are normal. No scleral icterus.  Neck: Normal range of motion and phonation normal.  Cardiovascular: Normal rate and regular rhythm.  Pulmonary/Chest: Effort normal. No stridor. No respiratory distress.  Abdominal: She  exhibits no distension.  Musculoskeletal: Normal range of motion. She exhibits no edema.       Right lower leg: She exhibits tenderness.       Legs: Neurological: She is alert and oriented to person, place, and time.  Skin: She is not diaphoretic.  Psychiatric: She has a normal mood and affect. Her behavior is normal.  Vitals reviewed.   ED Results and Treatments Labs (all labs ordered are listed, but only abnormal results are displayed) Labs Reviewed - No data to  display                                                                                                                       EKG  EKG Interpretation  Date/Time:    Ventricular Rate:    PR Interval:    QRS Duration:   QT Interval:    QTC Calculation:   R Axis:     Text Interpretation:        Radiology           [] Hide copied text  [] Hover for details   *Preliminary Results* Right lower extremity venous duplex completed. Right lower extremity is negative for deep vein thrombosis. There is evidence of acute superficial vein thrombosis involving the right greater saphenous vein in the distal thigh, as well as the mid medial calf. There is no evidence of right Baker's cyst.  Preliminary results discussed with Dr. Eudelia Bunchardama.  08/18/2017 4:29 PM  Gertie FeyMichelle Simonetti, BS, RVT, RDCS, RDMS            Electronically signed by Gwendolyn FillSimonetti, Michelle A at 08/18/2017 4:30 PM   Pertinent labs & imaging results that were available during my care of the patient were reviewed by me and considered in my medical decision making (see chart for details).  Medications Ordered in ED Medications - No data to display                                                                                                                                  Procedures Procedures EMERGENCY DEPARTMENT US SOFT TISSUE INTERPRETATION "Study: Limited Soft Tissue Ultrasound"  INDICATIONS: Pain Multiple views of the body part were obtained in  real-time with a multi-frequency linear probe  PERFORMED BY: Myself IMAGES ARCHIVED?: Yes SIDE:Right  BODY PART:Lower extremity INTERPRETATION:  no evidence of abscess or cellulitis. ?superficial thrombophlebitis    (including critical care time)  Medical Decision Making / ED Course I have reviewed the nursing notes for this encounter and the patient's prior records (if available in EHR or on provided paperwork).    Presentation consistent with superficial thrombophlebitis.  No evidence of abscess or cellulitis.  No evidence of DVT on ultrasound.  Patient instructed to continue warm compress, NSAID use.  Will also provide patient with prescription for Lidoderm.  Recommended follow-up with a primary care provider for further management.   The patient is safe for discharge with strict return precautions.  Final Clinical Impression(s) / ED Diagnoses Final diagnoses:  Thrombophlebitis of superficial veins of right lower extremity   Disposition: Discharge  Condition: Good  I have discussed the results, Dx and Tx plan with the patient who expressed understanding and agree(s) with the plan. Discharge instructions discussed at great length. The patient was given strict return precautions who verbalized understanding of the instructions. No further questions at time of discharge.    ED Discharge Orders        Ordered    lidocaine (XYLOCAINE) 5 % ointment  As needed     08/18/17 1651       Follow Up: Jackie Plum, MD [address] [place] [address] [phone]  Schedule an appointment as soon as possible for a visit  As needed      This chart was dictated using voice recognition software.  Despite best efforts to proofread,  errors can occur which can change the documentation meaning.  Nira Conn, MD 08/18/17 [phone]

## 2017-08-18 NOTE — ED Notes (Signed)
Bed: WA32 Expected date:  Expected time:  Means of arrival:  Comments: 

## 2017-08-18 NOTE — Progress Notes (Signed)
*  Preliminary Results* Right lower extremity venous duplex completed. Right lower extremity is negative for deep vein thrombosis. There is evidence of acute superficial vein thrombosis involving the right greater saphenous vein in the distal thigh, as well as the mid medial calf. There is no evidence of right Baker's cyst.  Preliminary results discussed with Dr. Eudelia Bunchardama.  08/18/2017 4:29 PM  Gertie FeyMichelle Glendene Wyer, BS, RVT, RDCS, RDMS

## 2017-08-21 ENCOUNTER — Ambulatory Visit
Admission: RE | Admit: 2017-08-21 | Discharge: 2017-08-21 | Disposition: A | Discharge status: Home / Self Care | Payer: No Typology Code available for payment source | Source: Ambulatory Visit | Attending: Infectious Diseases | Admitting: Infectious Diseases

## 2017-08-21 ENCOUNTER — Ambulatory Visit: Admission: RE | Admit: 2017-08-21 | Payer: No Typology Code available for payment source | Source: Ambulatory Visit

## 2017-08-21 DIAGNOSIS — R928 Other abnormal and inconclusive findings on diagnostic imaging of breast: Secondary | ICD-10-CM

## 2017-09-30 ENCOUNTER — Other Ambulatory Visit: Payer: Self-pay

## 2017-09-30 DIAGNOSIS — I83891 Varicose veins of right lower extremities with other complications: Secondary | ICD-10-CM

## 2017-10-06 ENCOUNTER — Encounter: Payer: Self-pay | Admitting: Vascular Surgery

## 2017-10-06 ENCOUNTER — Ambulatory Visit (HOSPITAL_COMMUNITY)
Admission: RE | Admit: 2017-10-06 | Discharge: 2017-10-06 | Disposition: A | Discharge status: Home / Self Care | Payer: Self-pay | Source: Ambulatory Visit | Attending: Vascular Surgery | Admitting: Vascular Surgery

## 2017-10-06 ENCOUNTER — Ambulatory Visit: Payer: Self-pay | Admitting: Vascular Surgery

## 2017-10-06 VITALS — BP 150/86 | HR 85 | Temp 97.5°F | Resp 18 | Ht 67.0 in | Wt 285.0 lb

## 2017-10-06 DIAGNOSIS — I83891 Varicose veins of right lower extremities with other complications: Secondary | ICD-10-CM | POA: Insufficient documentation

## 2017-10-06 NOTE — Progress Notes (Signed)
Subjective:     Patient ID: Anita Pacheco, female   DOB: 01-Jun-1971, 47 y.o.   MRN: [id number]  HPI This 47 year old female returns for 2561-month follow-up regarding her painful varicosities in the right leg and an episode of acute thrombophlebitis with thrombosed varicosities in the right distal thigh.  She was seen by me in January 2019 and prescribed long-leg elastic compression stockings 20-30 mm gradient as well as elevation and ibuprofen.  In February she went to the emergency department and had a venous ultrasound performed which revealed acute thrombophlebitis with thrombosis of right great saphenous vein- unknown to what extent-with no DVT.  Past Medical History:  Diagnosis Date  . ANXIETY 07/22/2006  . ASTHMA 07/22/2006  . Breast mass 04/25/2011  . Carpal tunnel syndrome 07/22/2006  . HERPES ZOSTER 05/28/2010  . HIV (human immunodeficiency virus infection) (HCC)   . Hypertension   . HYPERTENSION 07/22/2006  . PERIPHERAL NEUROPATHY 07/03/2008    Social History   Tobacco Use  . Smoking status: Never Smoker  . Smokeless tobacco: Never Used  Substance Use Topics  . Alcohol use: No    Family History  Problem Relation Age of Onset  . Cancer Father     Allergies  Allergen Reactions  . Hydrocodone Hives and Itching    Excessive vomiting     Current Outpatient Medications:  .  albuterol (PROVENTIL,VENTOLIN) 90 MCG/ACT inhaler, Inhale 1-2 puffs into the lungs every 4 (four) hours as needed. wheezing, Disp: , Rfl:  .  amLODipine (NORVASC) 10 MG tablet, Take 1 tablet (10 mg total) by mouth daily., Disp: 30 tablet, Rfl: 11 .  aspirin 325 MG tablet, Take 325 mg by mouth every 4 (four) hours as needed. For arthritis pain, Disp: , Rfl:  .  atenolol-chlorthalidone (TENORETIC) 50-25 MG tablet, Take 1 tablet by mouth daily., Disp: 30 tablet, Rfl: 6 .  diclofenac sodium (VOLTAREN) 1 % GEL, Apply 1 application topically 4 (four) times daily. (Patient taking differently: Apply 1  application topically 4 (four) times daily as needed (pain). ), Disp: 100 g, Rfl: 6 .  emtricitabine-rilpivir-tenofovir AF (ODEFSEY) 200-25-25 MG TABS tablet, Take 1 tablet by mouth daily with breakfast., Disp: 30 tablet, Rfl: 5 .  fluticasone (FLONASE) 50 MCG/ACT nasal spray, Place 1 spray into both nostrils daily as needed for allergies or rhinitis., Disp: , Rfl:  .  levocetirizine (XYZAL) 5 MG tablet, TAKE ONE TABLET EVERY EVENING (Patient taking differently: TAKE ONE TABLET PO EVERY DAY AS NEEDED FOR ALLERGIES), Disp: 31 tablet, Rfl: 0 .  lidocaine (XYLOCAINE) 5 % ointment, Apply 1 application topically as needed., Disp: 50 g, Rfl: 0 .  mometasone-formoterol (DULERA) 100-5 MCG/ACT AERO, Inhale 2 puffs into the lungs 2 (two) times daily., Disp: 1 Inhaler, Rfl: 5 .  pregabalin (LYRICA) 50 MG capsule, Take 1 capsule (50 mg total) by mouth at bedtime as needed. (Patient taking differently: Take 50 mg by mouth at bedtime as needed (pain). ), Disp: 90 capsule, Rfl: 3 .  Prenatal Vit-Fe Fumarate-FA (PRENATAL PLUS/IRON) 27-1 MG TABS, Take 1 tablet by mouth daily., Disp: 180 each, Rfl: 0 .  potassium chloride (K-DUR) 10 MEQ tablet, Take 4 tablets (40 mEq total) by mouth once. Please follow up with PCP, Disp: 4 tablet, Rfl: 0 .  sodium chloride (OCEAN) 0.65 % nasal spray, Place 1 spray into the nose as needed for congestion. (Patient not taking: Reported on 08/18/2017), Disp: 15 mL, Rfl: 12  Vitals:   10/06/17 1112 10/06/17 1119  BP: Marland Kitchen(!)  159/97 (!) 150/86  Pulse: 85   Resp: 18   Temp: (!) 97.5 F (36.4 C)   TempSrc: Oral   SpO2: 98%   Weight: 285 lb (129.3 kg)   Height: 5\' 7"  (1.702 m)     Body mass index is 44.64 kg/m.         Review of Systems   Denies chest pain, dyspnea on exertion, orthopnea, hemoptysis.  Patient is HIV positive on treatment. Objective:   Physical Exam BP (!) 150/86 (BP Location: Left Arm, Patient Position: Sitting, Cuff Size: Normal)   Pulse 85   Temp (!) 97.5  F (36.4 C) (Oral)   Resp 18   Ht 5\' 7"  (1.702 m)   Wt 285 lb (129.3 kg)   SpO2 98%   BMI 44.64 kg/m   General obese female in no apparent distress alert and oriented x3 Right lower extremity with 3 cm circumference edema greater than left.  Severe hyperpigmentation medially below the knee.  Thrombosed varicosities have become much less tender than 3 months ago in the distal thigh and medial calf.  1+ distal edema.  Today ordered venous duplex exam of the right leg which I reviewed and interpreted and also performed a bedside SonoSite ultrasound exam She does have a patent right great saphenous vein with gross reflux from the junction down to the distal thigh supplying the painful varicosities in the right leg     Assessment:     Painful varicosities right leg with previous thrombophlebitis and thrombosed varicosities and distal thigh and calf with gross reflux right great saphenous system causing chronic edema and pain HIV positive Morbid obesity    Plan:     Patient needs laser ablation right great saphenous vein from distal thigh to near the saphenofemoral junction to stabilize the skin and hopefully improve the edema somewhat. We will proceed with this treatment in the near future.

## 2017-10-07 ENCOUNTER — Other Ambulatory Visit: Payer: Self-pay | Admitting: *Deleted

## 2017-10-07 DIAGNOSIS — I83891 Varicose veins of right lower extremities with other complications: Secondary | ICD-10-CM

## 2017-10-26 ENCOUNTER — Ambulatory Visit: Payer: Self-pay | Admitting: Vascular Surgery

## 2017-10-26 ENCOUNTER — Encounter (HOSPITAL_COMMUNITY): Payer: Self-pay

## 2017-11-04 ENCOUNTER — Other Ambulatory Visit: Payer: Self-pay

## 2017-11-04 DIAGNOSIS — B20 Human immunodeficiency virus [HIV] disease: Secondary | ICD-10-CM

## 2017-11-04 DIAGNOSIS — Z113 Encounter for screening for infections with a predominantly sexual mode of transmission: Secondary | ICD-10-CM

## 2017-11-04 LAB — CBC
HCT: 33.8 % — ABNORMAL LOW (ref 35.0–45.0)
HEMOGLOBIN: 10.8 g/dL — AB (ref 11.7–15.5)
MCH: 25.4 pg — AB (ref 27.0–33.0)
MCHC: 32 g/dL (ref 32.0–36.0)
MCV: 79.5 fL — ABNORMAL LOW (ref 80.0–100.0)
MPV: 10.6 fL (ref 7.5–12.5)
Platelets: 252 10*3/uL (ref 140–400)
RBC: 4.25 10*6/uL (ref 3.80–5.10)
RDW: 16.1 % — ABNORMAL HIGH (ref 11.0–15.0)
WBC: 4.4 10*3/uL (ref 3.8–10.8)

## 2017-11-04 LAB — COMPREHENSIVE METABOLIC PANEL
AG Ratio: 1.1 (calc) (ref 1.0–2.5)
ALT: 9 U/L (ref 6–29)
AST: 13 U/L (ref 10–35)
Albumin: 3.7 g/dL (ref 3.6–5.1)
Alkaline phosphatase (APISO): 60 U/L (ref 33–115)
BUN: 13 mg/dL (ref 7–25)
CO2: 26 mmol/L (ref 20–32)
Calcium: 9 mg/dL (ref 8.6–10.2)
Chloride: 105 mmol/L (ref 98–110)
Creat: 0.72 mg/dL (ref 0.50–1.10)
GLUCOSE: 94 mg/dL (ref 65–99)
Globulin: 3.5 g/dL (calc) (ref 1.9–3.7)
Potassium: 3.4 mmol/L — ABNORMAL LOW (ref 3.5–5.3)
SODIUM: 139 mmol/L (ref 135–146)
TOTAL PROTEIN: 7.2 g/dL (ref 6.1–8.1)
Total Bilirubin: 0.4 mg/dL (ref 0.2–1.2)

## 2017-11-05 LAB — T-HELPER CELL (CD4) - (RCID CLINIC ONLY)
CD4 % Helper T Cell: 36 % (ref 33–55)
CD4 T CELL ABS: 740 /uL (ref 400–2700)

## 2017-11-05 LAB — URINE CYTOLOGY ANCILLARY ONLY
CHLAMYDIA, DNA PROBE: NEGATIVE
NEISSERIA GONORRHEA: NEGATIVE

## 2017-11-06 LAB — HIV-1 RNA QUANT-NO REFLEX-BLD
HIV 1 RNA QUANT: DETECTED {copies}/mL — AB
HIV-1 RNA QUANT, LOG: DETECTED {Log_copies}/mL — AB

## 2017-11-12 ENCOUNTER — Other Ambulatory Visit: Payer: Self-pay | Admitting: Infectious Diseases

## 2017-11-12 DIAGNOSIS — B20 Human immunodeficiency virus [HIV] disease: Secondary | ICD-10-CM

## 2017-11-18 ENCOUNTER — Encounter: Payer: Self-pay | Admitting: Infectious Diseases

## 2017-11-18 ENCOUNTER — Ambulatory Visit (INDEPENDENT_AMBULATORY_CARE_PROVIDER_SITE_OTHER): Payer: Self-pay | Admitting: Infectious Diseases

## 2017-11-18 VITALS — BP 149/97 | HR 63 | Temp 98.2°F | Ht 68.0 in | Wt 280.0 lb

## 2017-11-18 DIAGNOSIS — Z79899 Other long term (current) drug therapy: Secondary | ICD-10-CM

## 2017-11-18 DIAGNOSIS — I10 Essential (primary) hypertension: Secondary | ICD-10-CM

## 2017-11-18 DIAGNOSIS — Z23 Encounter for immunization: Secondary | ICD-10-CM

## 2017-11-18 DIAGNOSIS — B20 Human immunodeficiency virus [HIV] disease: Secondary | ICD-10-CM

## 2017-11-18 DIAGNOSIS — Z113 Encounter for screening for infections with a predominantly sexual mode of transmission: Secondary | ICD-10-CM

## 2017-11-18 DIAGNOSIS — Z6841 Body Mass Index (BMI) 40.0 and over, adult: Secondary | ICD-10-CM

## 2017-11-18 DIAGNOSIS — R739 Hyperglycemia, unspecified: Secondary | ICD-10-CM

## 2017-11-18 NOTE — Assessment & Plan Note (Signed)
Encouraged to her to watch diet, exercise.

## 2017-11-18 NOTE — Assessment & Plan Note (Signed)
bp is better although still high.  Will watch as she loses wt.

## 2017-11-18 NOTE — Progress Notes (Signed)
   Subjective:    Patient ID: Anita Pacheco, female    DOB: Oct 23, 1970, 47 y.o.   MRN: [id number]  HPI 47yo F with HIV+, previously started on complera -->odefsy, HTN (on norvasc, atenolol-chlorthalidone) and anxiety.  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  Trying to lose wt for last 3-4 weeks. Wants to lose 10-12#.  More active outside now. has been walking as well. Has met with nutritionist. Daughter is helping her.   No problems with ART.   NL mammo 10-2016 PAP 04-2017  HIV 1 RNA Quant (copies/mL)  Date Value  11/04/2017 <20 DETECTED (A)  07/08/2017 <20 DETECTED (A)  05/28/2017 <20 NOT DETECTED   CD4 T Cell Abs (/uL)  Date Value  11/04/2017 740  07/09/2017 1,030  05/28/2017 730    Review of Systems  Constitutional: Negative for appetite change, chills, fever and unexpected weight change.  Gastrointestinal: Negative for constipation and diarrhea.  Genitourinary: Negative for difficulty urinating.  Musculoskeletal: Positive for myalgias.  Please see HPI. All other systems reviewed and negative.      Objective:   Physical Exam  Constitutional: She is oriented to person, place, and time. She appears well-developed and well-nourished.  HENT:  Mouth/Throat: No oropharyngeal exudate.  Eyes: Pupils are equal, round, and reactive to light. EOM are normal.  Neck: Normal range of motion. Neck supple.  Cardiovascular: Normal rate, regular rhythm and normal heart sounds.  Pulmonary/Chest: Effort normal and breath sounds normal.  Abdominal: Soft. Bowel sounds are normal. She exhibits no distension. There is no tenderness.  Neurological: She is alert and oriented to person, place, and time.  Psychiatric: She has a normal mood and affect.       Assessment & Plan:

## 2017-11-18 NOTE — Addendum Note (Signed)
Addended by: Roger Kill on: 11/18/2017 11:49 AM   Modules accepted: Orders

## 2017-11-18 NOTE — Assessment & Plan Note (Signed)
She is doing well Encouraged adherence Will set up for pap prevnar rtc in 9 months.  Offered/refused condoms.

## 2017-11-18 NOTE — Assessment & Plan Note (Signed)
Most recently normal Continue to watch

## 2017-12-03 ENCOUNTER — Ambulatory Visit: Payer: No Typology Code available for payment source | Admitting: Infectious Diseases

## 2017-12-15 ENCOUNTER — Other Ambulatory Visit: Payer: No Typology Code available for payment source | Admitting: Vascular Surgery

## 2017-12-22 ENCOUNTER — Ambulatory Visit: Payer: No Typology Code available for payment source | Admitting: Vascular Surgery

## 2017-12-22 ENCOUNTER — Ambulatory Visit (INDEPENDENT_AMBULATORY_CARE_PROVIDER_SITE_OTHER): Payer: Self-pay | Admitting: Vascular Surgery

## 2017-12-22 ENCOUNTER — Encounter: Payer: Self-pay | Admitting: Vascular Surgery

## 2017-12-22 ENCOUNTER — Other Ambulatory Visit: Payer: Self-pay

## 2017-12-22 ENCOUNTER — Encounter (HOSPITAL_COMMUNITY): Payer: No Typology Code available for payment source

## 2017-12-22 VITALS — BP 134/90 | HR 84 | Temp 98.3°F | Resp 16 | Ht 67.5 in | Wt 278.0 lb

## 2017-12-22 DIAGNOSIS — I83892 Varicose veins of left lower extremities with other complications: Secondary | ICD-10-CM

## 2017-12-22 HISTORY — PX: ENDOVENOUS ABLATION SAPHENOUS VEIN W/ LASER: SUR449

## 2017-12-22 NOTE — Progress Notes (Signed)
Laser Ablation Procedure    Date: 12/22/2017   Anita ShiversRonette Pacheco DOB:[birth year]  Consent signed: Yes    Surgeon:  Dr. Quita SkyeJames D. Hart RochesterLawson  Procedure: Laser Ablation: right Greater Saphenous Vein  BP 134/90 (BP Location: Left Arm, Patient Position: Sitting, Cuff Size: Large)   Pulse 84   Temp 98.3 F (36.8 C) (Oral)   Resp 16   Ht 5' 7.5" (1.715 m)   Wt 278 lb (126.1 kg)   SpO2 100%   BMI 42.90 kg/m   Tumescent Anesthesia: 200 cc 0.9% NaCl with 50 cc Lidocaine HCL 1% and 15 cc 8.4% NaHCO3  Local Anesthesia: 10 cc Lidocaine HCL and NaHCO3 (ratio 2:1)  Pulsed Mode: 15 watts, 500ms delay, 1.0 duration  Total Energy:  899 Joules            Total Pulses:  61              Total Time: 1:00    Patient tolerated procedure well    Description of Procedure:  After marking the course of the secondary varicosities, the patient was placed on the operating table in the supine position, and the right leg was prepped and draped in sterile fashion.   Local anesthetic was administered and under ultrasound guidance the saphenous vein was accessed with a micro needle and guide wire; then the mirco puncture sheath was placed.  A guide wire was inserted saphenofemoral junction , followed by a 5 french sheath.  The position of the sheath and then the laser fiber below the junction was confirmed using the ultrasound.  Tumescent anesthesia was administered along the course of the saphenous vein using ultrasound guidance. The patient was placed in Trendelenburg position and protective laser glasses were placed on patient and staff, and the laser was fired at 15 watts continuous mode advancing 1-432mm/second for a total of 899 joules.     Steri strip was applied to the IV insertion site and ABD pads and panty hose compression stockings were applied.  Ace wrap bandages were applied over the right thigh and at the top of the saphenofemoral junction. Blood loss was less than 15 cc.  The patient ambulated out of the  operating room having tolerated the procedure well.

## 2017-12-22 NOTE — Progress Notes (Addendum)
  Subjective:     Patient ID: Anita Pacheco, female   DOB: 24-Nov-1970, 47 y.o.   MRN: [id number]  HPI This 47 year old female had laser ablation of the rightgreat saphenous vein from the mid thigh to near the saphenofemoral junction performed under local tumescent anesthesia. Attempts were made to enter the vein in the distal thigh and were successful but the wire would not pass proximally and therefore we utilized the mid thigh vein. A total of 899 J of energy was utilized. She tolerated the procedure well.  Review of Systems     Objective:   Physical Exam.BP 134/90 (BP Location: Left Arm, Patient Position: Sitting, Cuff Size: Large)   Pulse 84   Temp 98.3 F (36.8 C) (Oral)   Resp 16   Ht 5' 7.5" (1.715 m)   Wt 278 lb (126.1 kg)   SpO2 100%   BMI 42.90 kg/m        Assessment:     Well-tolerated laser ablation right great saphenous findings from mid thigh to near the saphenofemoral junction performed under local tumescent anesthesia    Plan:     Patient will return in 1 week for venous duplex exam to confirm closure right great saphenous vein and be seen by Dr. Darrick Pennafields

## 2017-12-24 ENCOUNTER — Encounter: Payer: Self-pay | Admitting: Vascular Surgery

## 2017-12-29 ENCOUNTER — Encounter: Payer: Self-pay | Admitting: Vascular Surgery

## 2017-12-29 ENCOUNTER — Ambulatory Visit (INDEPENDENT_AMBULATORY_CARE_PROVIDER_SITE_OTHER): Payer: Self-pay | Admitting: Vascular Surgery

## 2017-12-29 ENCOUNTER — Ambulatory Visit (HOSPITAL_COMMUNITY)
Admission: RE | Admit: 2017-12-29 | Discharge: 2017-12-29 | Disposition: A | Discharge status: Home / Self Care | Payer: Self-pay | Source: Ambulatory Visit | Attending: Vascular Surgery | Admitting: Vascular Surgery

## 2017-12-29 VITALS — BP 132/84 | HR 85 | Temp 97.2°F | Resp 18 | Ht 67.5 in | Wt 278.0 lb

## 2017-12-29 DIAGNOSIS — I83891 Varicose veins of right lower extremities with other complications: Secondary | ICD-10-CM | POA: Insufficient documentation

## 2017-12-29 DIAGNOSIS — I83811 Varicose veins of right lower extremities with pain: Secondary | ICD-10-CM

## 2017-12-29 NOTE — Progress Notes (Signed)
Patient is a 47 year old female who returns for follow-up today.  She underwent laser ablation of her right greater saphenous vein on December 22, 2017.  She states she still has some soreness in the leg but overall is improved.  She denies shortness of breath or chest pain.  She continues to wear her compression stockings.  Physical exam:  Vitals:   12/29/17 0955  BP: 132/84  Pulse: 85  Resp: 18  Temp: (!) 97.2 F (36.2 C)  TempSrc: Oral  SpO2: 99%  Weight: 278 lb (126.1 kg)  Height: 5' 7.5" (1.715 m)    Extremities: Right lower extremity no incisional erythema no significant edema no obvious large varicosities  Data: Patient had a duplex ultrasound today which showed good closure of the right greater saphenous vein  Assessment: Doing well status post ablation right greater saphenous vein for painful varicosities.  Plan: The patient will follow-up on an as-needed basis.  Fabienne Brunsharles Cayley Pester, MD [facility] Office: [phone] Pager: [phone]

## 2018-03-04 ENCOUNTER — Ambulatory Visit: Payer: No Typology Code available for payment source

## 2018-03-11 ENCOUNTER — Encounter: Payer: Self-pay | Admitting: Infectious Diseases

## 2018-03-11 ENCOUNTER — Ambulatory Visit: Payer: Self-pay

## 2018-03-25 ENCOUNTER — Ambulatory Visit (INDEPENDENT_AMBULATORY_CARE_PROVIDER_SITE_OTHER): Payer: Self-pay | Admitting: Licensed Clinical Social Worker

## 2018-03-25 DIAGNOSIS — F330 Major depressive disorder, recurrent, mild: Secondary | ICD-10-CM
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
## 2018-04-15 ENCOUNTER — Ambulatory Visit (INDEPENDENT_AMBULATORY_CARE_PROVIDER_SITE_OTHER): Payer: Self-pay | Admitting: Licensed Clinical Social Worker

## 2018-04-15 DIAGNOSIS — F330 Major depressive disorder, recurrent, mild: Secondary | ICD-10-CM
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
## 2018-04-23 ENCOUNTER — Other Ambulatory Visit: Payer: Self-pay | Admitting: Infectious Diseases

## 2018-04-23 DIAGNOSIS — B20 Human immunodeficiency virus [HIV] disease: Secondary | ICD-10-CM

## 2018-04-29 ENCOUNTER — Ambulatory Visit: Payer: Self-pay | Admitting: Licensed Clinical Social Worker

## 2018-08-10 ENCOUNTER — Other Ambulatory Visit: Payer: Self-pay

## 2018-08-10 DIAGNOSIS — Z113 Encounter for screening for infections with a predominantly sexual mode of transmission: Secondary | ICD-10-CM

## 2018-08-10 DIAGNOSIS — Z79899 Other long term (current) drug therapy: Secondary | ICD-10-CM

## 2018-08-10 DIAGNOSIS — B20 Human immunodeficiency virus [HIV] disease: Secondary | ICD-10-CM

## 2018-08-11 ENCOUNTER — Telehealth: Payer: Self-pay

## 2018-08-11 LAB — T-HELPER CELL (CD4) - (RCID CLINIC ONLY)
CD4 T CELL ABS: 360 /uL — AB (ref 400–2700)
CD4 T CELL HELPER: 23 % — AB (ref 33–55)

## 2018-08-11 NOTE — Telephone Encounter (Signed)
  Attempted to call  patient to make aware of recent lab results. Per Dr. Ninetta LightsHatcher patient needs 40meq potassium po and repeat blood potassium level 24h later.  LPN will try to call patient again.  Valarie ConesShaquenia Marguerite Barba, LPN

## 2018-08-12 LAB — LIPID PANEL
Cholesterol: 205 mg/dL — ABNORMAL HIGH (ref <200)
HDL: 43 mg/dL — ABNORMAL LOW (ref >=50)
LDL Cholesterol (Calc): 135 mg/dL (calc) — ABNORMAL HIGH
NON-HDL CHOLESTEROL (CALC): 162 mg/dL — AB (ref <130)
Total CHOL/HDL Ratio: 4.8 (calc) (ref <5.0)
Triglycerides: 144 mg/dL (ref <150)

## 2018-08-12 LAB — CBC
HCT: 34.6 % — ABNORMAL LOW (ref 35.0–45.0)
Hemoglobin: 11.2 g/dL — ABNORMAL LOW (ref 11.7–15.5)
MCH: 25.6 pg — ABNORMAL LOW (ref 27.0–33.0)
MCHC: 32.4 g/dL (ref 32.0–36.0)
MCV: 79.2 fL — ABNORMAL LOW (ref 80.0–100.0)
MPV: 10.4 fL (ref 7.5–12.5)
Platelets: 183 10*3/uL (ref 140–400)
RBC: 4.37 10*6/uL (ref 3.80–5.10)
RDW: 15.6 % — ABNORMAL HIGH (ref 11.0–15.0)
WBC: 3.2 10*3/uL — ABNORMAL LOW (ref 3.8–10.8)

## 2018-08-12 LAB — COMPREHENSIVE METABOLIC PANEL
AG Ratio: 0.8 (calc) — ABNORMAL LOW (ref 1.0–2.5)
ALT: 16 U/L (ref 6–29)
AST: 23 U/L (ref 10–35)
Albumin: 3.6 g/dL (ref 3.6–5.1)
Alkaline phosphatase (APISO): 55 U/L (ref 31–125)
BILIRUBIN TOTAL: 0.3 mg/dL (ref 0.2–1.2)
BUN: 13 mg/dL (ref 7–25)
CALCIUM: 8.9 mg/dL (ref 8.6–10.2)
CO2: 25 mmol/L (ref 20–32)
CREATININE: 0.83 mg/dL (ref 0.50–1.10)
Chloride: 103 mmol/L (ref 98–110)
GLUCOSE: 108 mg/dL — AB (ref 65–99)
Globulin: 4.4 g/dL (calc) — ABNORMAL HIGH (ref 1.9–3.7)
Potassium: 3.3 mmol/L — ABNORMAL LOW (ref 3.5–5.3)
SODIUM: 138 mmol/L (ref 135–146)
TOTAL PROTEIN: 8 g/dL (ref 6.1–8.1)

## 2018-08-12 LAB — RPR: RPR: NONREACTIVE

## 2018-08-12 LAB — HIV-1 RNA QUANT-NO REFLEX-BLD
HIV 1 RNA QUANT: 31100 {copies}/mL — AB
HIV-1 RNA Quant, Log: 4.49 Log copies/mL — ABNORMAL HIGH

## 2018-08-12 NOTE — Telephone Encounter (Signed)
2nd attempt to contact patient.  Anita ConesShaquenia Jakeem Grape, LPN

## 2018-08-24 ENCOUNTER — Encounter: Payer: Self-pay | Admitting: Infectious Diseases

## 2018-08-24 ENCOUNTER — Ambulatory Visit: Payer: No Typology Code available for payment source | Admitting: Infectious Diseases

## 2018-08-24 ENCOUNTER — Ambulatory Visit (INDEPENDENT_AMBULATORY_CARE_PROVIDER_SITE_OTHER): Payer: Self-pay | Admitting: Infectious Diseases

## 2018-08-24 DIAGNOSIS — Z23 Encounter for immunization: Secondary | ICD-10-CM

## 2018-08-24 DIAGNOSIS — M542 Cervicalgia: Secondary | ICD-10-CM

## 2018-08-24 DIAGNOSIS — B20 Human immunodeficiency virus [HIV] disease: Secondary | ICD-10-CM

## 2018-08-24 DIAGNOSIS — Z79899 Other long term (current) drug therapy: Secondary | ICD-10-CM

## 2018-08-24 DIAGNOSIS — I10 Essential (primary) hypertension: Secondary | ICD-10-CM

## 2018-08-24 MED ORDER — ATENOLOL-CHLORTHALIDONE 50-25 MG PO TABS: 1.0000 | ORAL_TABLET | Freq: Every day | ORAL | 6 refills | Status: DC

## 2018-08-24 NOTE — Progress Notes (Signed)
   Subjective:    Patient ID: Anita Pacheco, female    DOB: 1970-12-29, 48 y.o.   MRN: [id number]  HPI 48yo F with HIV+, previously started on complera -->odefsy, HTN (on norvasc, atenolol-chlorthalidone) and anxiety.  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  Trying to lose wt for last 3-4 weeks. Wants to lose 10-12 Has been feeling more achey recently, having jaw/ear/throat on L pain, throbbing. Has had difficulty eating, using salt water. Worse with chewing. Can take liquids.  Has marked HTN today, has been off her 2nd rx for several months.  Denies missed ART. States she has been off routine. Takes rx at night but has now switched shifts.   HIV 1 RNA Quant (copies/mL)  Date Value  08/10/2018 31,100 (H)  11/04/2017 <20 DETECTED (A)  07/08/2017 <20 DETECTED (A)   CD4 T Cell Abs (/uL)  Date Value  08/10/2018 360 (L)  11/04/2017 740  07/09/2017 1,030   Has lost 31# since 2016. stablised last 6 months. Had gotten to 267# .  Review of Systems  Constitutional: Negative for appetite change and unexpected weight change.  HENT: Positive for ear pain. Negative for mouth sores.   Respiratory: Negative for cough and shortness of breath.   Cardiovascular: Negative for chest pain.  Gastrointestinal: Negative for constipation and diarrhea.  Genitourinary: Negative for difficulty urinating.  Please see HPI. All other systems reviewed and negative.     Objective:   Physical Exam Constitutional:      Appearance: Normal appearance.  HENT:     Right Ear: Tympanic membrane, ear canal and external ear normal.     Left Ear: Tympanic membrane, ear canal and external ear normal.     Nose: No congestion.     Mouth/Throat:     Mouth: Mucous membranes are moist.     Pharynx: No oropharyngeal exudate.  Eyes:     Extraocular Movements: Extraocular movements intact.     Pupils: Pupils are equal, round, and reactive to light.  Neck:     Vascular: No carotid bruit.    Cardiovascular:     Rate and Rhythm: Normal rate and regular rhythm.  Pulmonary:     Effort: Pulmonary effort is normal.     Breath sounds: Normal breath sounds.  Abdominal:     General: Bowel sounds are normal. There is no distension.     Palpations: Abdomen is soft.     Tenderness: There is no abdominal tenderness.  Lymphadenopathy:     Cervical: No cervical adenopathy.  Skin:    General: Skin is warm and dry.  Neurological:     General: No focal deficit present.     Mental Status: She is alert and oriented to person, place, and time.  Psychiatric:        Mood and Affect: Mood normal.       Assessment & Plan:

## 2018-08-24 NOTE — Assessment & Plan Note (Signed)
Will check her VL and genotype today Will see her back in 6 weeks.  flu shot today Offered/refused condoms.

## 2018-08-24 NOTE — Assessment & Plan Note (Signed)
Will refill her tenoretic.  Continue norvasc.

## 2018-08-24 NOTE — Assessment & Plan Note (Signed)
Suspect she has muscle strain, worse with jaw pain.  Will try OTC NSAID. Heating pad.

## 2018-09-03 LAB — HIV-1 RNA ULTRAQUANT REFLEX TO GENTYP+
HIV 1 RNA Quant: 20800 copies/mL — ABNORMAL HIGH
HIV-1 RNA Quant, Log: 4.32 Log copies/mL — ABNORMAL HIGH

## 2018-09-03 LAB — HIV-1 GENOTYPE: HIV-1 Genotype: DETECTED — AB

## 2018-09-20 ENCOUNTER — Telehealth (INDEPENDENT_AMBULATORY_CARE_PROVIDER_SITE_OTHER): Payer: Self-pay | Admitting: Infectious Diseases

## 2018-09-20 ENCOUNTER — Other Ambulatory Visit: Payer: Self-pay

## 2018-09-20 DIAGNOSIS — B20 Human immunodeficiency virus [HIV] disease: Secondary | ICD-10-CM

## 2018-09-20 NOTE — Progress Notes (Signed)
   Subjective:    Patient ID: Anita Pacheco, female    DOB: 20-Feb-1971, 48 y.o.   MRN: [id number]  HPI 48 yo F here on "virtual visit" Hx of HIV+, previously started on complera -->odefsy, HTN (on norvasc, atenolol-chlorthalidone) and anxiety.  Had surgery for thrombophlebitis/varicosities. Still having knee pain.   Has been feeling well, self quarantining.  Has been off her meds recently due to shift work.  Now that she is working from home, she is taking meds more regularly.  Has been having trouble with taking her anti-htn rx, give her nausea in AM.    HIV 1 RNA Quant (copies/mL)  Date Value  08/24/2018 20,800 (H)  08/10/2018 31,100 (H)  11/04/2017 <20 DETECTED (A)   CD4 T Cell Abs (/uL)  Date Value  08/10/2018 360 (L)  11/04/2017 740  07/09/2017 1,030     Review of Systems  Constitutional: Negative for appetite change, chills, fever and unexpected weight change.  Respiratory: Negative for cough and shortness of breath.   Gastrointestinal: Positive for nausea and vomiting. Negative for constipation.  Genitourinary: Negative for difficulty urinating.  Musculoskeletal: Positive for arthralgias.  Neurological: Positive for headaches.       Objective:   Physical Exam None done, e-visit.        Assessment & Plan:      HIV- She will return to clinic in 2 months after she resumes taking her ART regularly. Labs entered for repeat CD4 and HIV RNA and genotype.   HTN- I asked her to stop her norvasc as it is making her have n/v.  I asked her to have her BP checked again at her f/u lab work.

## 2018-09-29 ENCOUNTER — Telehealth: Payer: Self-pay | Admitting: Behavioral Health

## 2018-09-29 NOTE — Telephone Encounter (Signed)
Patient called stating she needs her Norvasc 10 mg daily prescription renewed.  She states the atenolol-chlorthalidone (TENORETIC) 50-25mg  is the medication that was giving her nausea not the Norvasc.  Informed her Dr. Ninetta Lights would be made aware and if change is appropriate will send it to [facility] on [facility] for her.   Angeline Slim RN

## 2018-09-30 ENCOUNTER — Other Ambulatory Visit: Payer: Self-pay

## 2018-09-30 MED ORDER — AMLODIPINE BESYLATE 10 MG PO TABS: 10.0000 mg | ORAL_TABLET | Freq: Every day | ORAL | 11 refills | Status: DC

## 2018-09-30 NOTE — Telephone Encounter (Signed)
Appropriate, thanks for refilling jeff

## 2018-09-30 NOTE — Progress Notes (Signed)
Patient called and made aware of medication changes and sent to preferred pharmacy.  Valarie Cones, LPN

## 2018-10-25 ENCOUNTER — Encounter: Payer: Self-pay | Admitting: Infectious Diseases

## 2018-11-23 ENCOUNTER — Other Ambulatory Visit: Payer: No Typology Code available for payment source

## 2018-11-26 ENCOUNTER — Other Ambulatory Visit: Payer: Self-pay | Admitting: Infectious Diseases

## 2018-11-26 DIAGNOSIS — B20 Human immunodeficiency virus [HIV] disease: Secondary | ICD-10-CM

## 2018-12-02 ENCOUNTER — Other Ambulatory Visit: Payer: Self-pay

## 2018-12-02 DIAGNOSIS — B20 Human immunodeficiency virus [HIV] disease: Secondary | ICD-10-CM

## 2018-12-03 LAB — T-HELPER CELL (CD4) - (RCID CLINIC ONLY)
CD4 % Helper T Cell: 26 % — ABNORMAL LOW (ref 33–65)
CD4 T Cell Abs: 461 /uL (ref 400–1790)

## 2018-12-15 LAB — HIV-1 INTEGRASE GENOTYPE

## 2018-12-16 ENCOUNTER — Ambulatory Visit: Payer: No Typology Code available for payment source | Admitting: Infectious Diseases

## 2018-12-22 ENCOUNTER — Telehealth: Payer: Self-pay | Admitting: Infectious Diseases

## 2018-12-22 NOTE — Telephone Encounter (Signed)
COVID-19 Pre-Screening Questions: ° °Do you currently have a fever (>100 °F), chills or unexplained body aches? No  ° °Are you currently experiencing new cough, shortness of breath, sore throat, runny nose? No  °•  °Have you recently travelled outside the state of [place] in the last 14 days? No  °•  °1. Have you been in contact with someone that is currently pending confirmation of Covid19 testing or has been confirmed to have the Covid19 virus?  No  ° °

## 2018-12-23 ENCOUNTER — Encounter: Payer: Self-pay | Admitting: Infectious Diseases

## 2018-12-23 ENCOUNTER — Other Ambulatory Visit: Payer: Self-pay

## 2018-12-23 ENCOUNTER — Ambulatory Visit: Payer: Self-pay | Admitting: Infectious Diseases

## 2018-12-23 VITALS — BP 148/98 | HR 90 | Temp 98.7°F | Wt 282.0 lb

## 2018-12-23 DIAGNOSIS — B0229 Other postherpetic nervous system involvement: Secondary | ICD-10-CM

## 2018-12-23 DIAGNOSIS — B20 Human immunodeficiency virus [HIV] disease: Secondary | ICD-10-CM

## 2018-12-23 DIAGNOSIS — I10 Essential (primary) hypertension: Secondary | ICD-10-CM

## 2018-12-23 DIAGNOSIS — Z79899 Other long term (current) drug therapy: Secondary | ICD-10-CM

## 2018-12-23 DIAGNOSIS — N63 Unspecified lump in unspecified breast: Secondary | ICD-10-CM

## 2018-12-23 DIAGNOSIS — Z113 Encounter for screening for infections with a predominantly sexual mode of transmission: Secondary | ICD-10-CM

## 2018-12-23 DIAGNOSIS — G609 Hereditary and idiopathic neuropathy, unspecified: Secondary | ICD-10-CM

## 2018-12-23 DIAGNOSIS — Z68.41 Body mass index (BMI) pediatric, greater than or equal to 95th percentile for age: Secondary | ICD-10-CM

## 2018-12-23 DIAGNOSIS — Z23 Encounter for immunization: Secondary | ICD-10-CM

## 2018-12-23 MED ORDER — PREGABALIN 50 MG PO CAPS: 50.0000 mg | ORAL_CAPSULE | Freq: Every evening | ORAL | 3 refills | Status: DC | PRN

## 2018-12-23 NOTE — Assessment & Plan Note (Signed)
Is asx Is back on norvasc.  Will f/u with PCP On ASA.

## 2018-12-23 NOTE — Assessment & Plan Note (Signed)
Doing well with odefsy Will get PAP this fall Wants to defer mammo til next year.  Taking MVI.  Offered/refused condoms.  PCV23 today.  rtc in 9 months.

## 2018-12-23 NOTE — Assessment & Plan Note (Signed)
Will refill her lyrica at her request.

## 2018-12-23 NOTE — Addendum Note (Signed)
Addended by: Lenore Cordia on: 12/23/2018 04:45 PM   Modules accepted: Orders

## 2018-12-23 NOTE — Assessment & Plan Note (Addendum)
Is doing 6 miles /day on exercise bike Watch diet.  Is going to reduce sodas

## 2018-12-23 NOTE — Progress Notes (Signed)
   Subjective:    Patient ID: Anita Pacheco, female    DOB: 10/04/1970, 48 y.o.   MRN: [id number]  HPI 48yo F with HIV+, previously started on complera -->odefsy, HTN and anxiety.  At her prev visit, her norvasc was stopped due to concern about nausea.  Has gained wt since last visit- 3#. Had surgery for thrombophlebitis/varicosities.  Is having R knee pain, is considering surgery for this.   Has more stress but not working "crazy hours" I was before. Few missed doses, fell asleep.  Has child/son with cerebral palsy at home.  No sick exposures. No guests in home.   Last mammo 07-2017 Last PAP was done by NP at [facility] 2019.   HIV 1 RNA Quant (copies/mL)  Date Value  08/24/2018 20,800 (H)  08/10/2018 31,100 (H)  11/04/2017 <20 DETECTED (A)   CD4 T Cell Abs (/uL)  Date Value  12/02/2018 461  08/10/2018 360 (L)  11/04/2017 740    Review of Systems  Constitutional: Negative for chills and fever.  Respiratory: Negative for cough and shortness of breath.   Gastrointestinal: Negative for constipation and diarrhea.  Genitourinary: Negative for difficulty urinating and menstrual problem.  Please see HPI. All other systems reviewed and negative.     Objective:   Physical Exam Constitutional:      Appearance: She is obese.  HENT:     Mouth/Throat:     Mouth: Mucous membranes are moist.     Pharynx: No oropharyngeal exudate.  Eyes:     Extraocular Movements: Extraocular movements intact.     Pupils: Pupils are equal, round, and reactive to light.  Neck:     Musculoskeletal: Normal range of motion and neck supple.  Cardiovascular:     Rate and Rhythm: Normal rate and regular rhythm.  Pulmonary:     Effort: Pulmonary effort is normal.     Breath sounds: Normal breath sounds.  Abdominal:     General: Bowel sounds are normal. There is no distension.     Palpations: Abdomen is soft.     Tenderness: There is no abdominal tenderness.  Musculoskeletal:     Right lower leg:  No edema.     Left lower leg: No edema.  Neurological:     General: No focal deficit present.     Mental Status: She is alert.  Psychiatric:        Mood and Affect: Mood normal.           Assessment & Plan:

## 2018-12-23 NOTE — Assessment & Plan Note (Signed)
Last  mammo normal (07-2017 [facility] 1)

## 2019-02-07 ENCOUNTER — Other Ambulatory Visit: Payer: Self-pay | Admitting: Infectious Diseases

## 2019-02-07 DIAGNOSIS — B20 Human immunodeficiency virus [HIV] disease: Secondary | ICD-10-CM

## 2019-07-05 ENCOUNTER — Telehealth: Payer: Self-pay

## 2019-07-05 NOTE — Telephone Encounter (Signed)
Received call from pharmacy to confirm patient's regimen. Informed pharmacy patient is on Odefsey.  Pharmacy would like to know if it is okay to fill prescription. Last refill was in September. Spoke with patient who states she has been adherent with medication; states pharmacy has been sending extra medication.  Pharmacy was instructed to continue filling medication. Lorenso Courier, [place]

## 2019-07-15 ENCOUNTER — Encounter: Payer: Self-pay | Admitting: Infectious Diseases

## 2019-09-06 ENCOUNTER — Encounter: Payer: Self-pay | Admitting: Infectious Diseases

## 2019-09-06 ENCOUNTER — Other Ambulatory Visit: Payer: Self-pay | Admitting: *Deleted

## 2019-09-06 ENCOUNTER — Ambulatory Visit: Payer: 59 | Admitting: Infectious Diseases

## 2019-09-06 ENCOUNTER — Other Ambulatory Visit: Payer: Self-pay

## 2019-09-06 VITALS — Wt 299.0 lb

## 2019-09-06 DIAGNOSIS — Z23 Encounter for immunization: Secondary | ICD-10-CM

## 2019-09-06 DIAGNOSIS — G609 Hereditary and idiopathic neuropathy, unspecified: Secondary | ICD-10-CM

## 2019-09-06 DIAGNOSIS — B20 Human immunodeficiency virus [HIV] disease: Secondary | ICD-10-CM | POA: Diagnosis not present

## 2019-09-06 DIAGNOSIS — Z113 Encounter for screening for infections with a predominantly sexual mode of transmission: Secondary | ICD-10-CM

## 2019-09-06 DIAGNOSIS — R300 Dysuria: Secondary | ICD-10-CM

## 2019-09-06 DIAGNOSIS — Z79899 Other long term (current) drug therapy: Secondary | ICD-10-CM

## 2019-09-06 MED ORDER — CEPHALEXIN 500 MG PO CAPS: 500.0000 mg | ORAL_CAPSULE | Freq: Two times a day (BID) | ORAL | 0 refills | Status: DC

## 2019-09-06 MED ORDER — PREGABALIN 50 MG PO CAPS: 50.0000 mg | ORAL_CAPSULE | Freq: Every evening | ORAL | 3 refills | Status: DC | PRN

## 2019-09-06 NOTE — Addendum Note (Signed)
Addended by: Mariea Clonts D on: 09/06/2019 04:48 PM   Modules accepted: Orders

## 2019-09-06 NOTE — Progress Notes (Signed)
   Subjective:    Patient ID: Anita Pacheco, female    DOB: Oct 11, 1970, 49 y.o.   MRN: [id number]  HPI 49yo F with HIV+, previously started on complera -->odefsy, HTN and anxiety.   Is having R knee pain, is considering surgery for this. 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  Has pain with urination, no odor, occas cloudiness. No back pain but does have R anterior flank pain.  She has had falls which she attributes to the pain from her flank.   Has child/son with cerebral palsy at home.   Last mammo 07-2017 Last PAP was done by NP at [facility] 2019.    Review of Systems  Constitutional: Negative for appetite change, chills, fever and unexpected weight change.  Gastrointestinal: Positive for abdominal pain. Negative for constipation and diarrhea.  Genitourinary: Positive for difficulty urinating, dysuria and frequency.       Objective:   Physical Exam Constitutional:      Appearance: She is obese. She is not ill-appearing or toxic-appearing.  HENT:     Mouth/Throat:     Mouth: Mucous membranes are moist.     Pharynx: No oropharyngeal exudate.  Eyes:     Extraocular Movements: Extraocular movements intact.     Pupils: Pupils are equal, round, and reactive to light.  Cardiovascular:     Rate and Rhythm: Normal rate and regular rhythm.  Pulmonary:     Effort: Pulmonary effort is normal.     Breath sounds: Normal breath sounds.  Abdominal:     General: Bowel sounds are normal. There is no distension.     Palpations: Abdomen is soft.     Tenderness: There is no abdominal tenderness. There is no right CVA tenderness.  Musculoskeletal:     Cervical back: Normal range of motion and neck supple.     Right lower leg: No edema.     Left lower leg: No edema.  Neurological:     General: No focal deficit present.     Mental Status: She is alert.  Psychiatric:        Mood and Affect: Mood normal.           Assessment & Plan:

## 2019-09-06 NOTE — Assessment & Plan Note (Signed)
Will give her rx for keflex.

## 2019-09-06 NOTE — Assessment & Plan Note (Signed)
She needs pap, mammo Will check her labs today ecnourage flu vax today.  Encourage COVID vax when available.  rtc in 9 months.

## 2019-09-07 LAB — URINE CULTURE
MICRO NUMBER:: 10232538
Result:: NO GROWTH
SPECIMEN QUALITY:: ADEQUATE

## 2019-09-07 LAB — MICROALBUMIN / CREATININE URINE RATIO
Creatinine, Urine: 208 mg/dL (ref 20–275)
Microalb Creat Ratio: 13 mcg/mg creat (ref <30)
Microalb, Ur: 2.8 mg/dL

## 2019-09-07 LAB — T-HELPER CELL (CD4) - (RCID CLINIC ONLY)
CD4 % Helper T Cell: 31 % — ABNORMAL LOW (ref 33–65)
CD4 T Cell Abs: 786 /uL (ref 400–1790)

## 2019-09-10 LAB — COMPREHENSIVE METABOLIC PANEL
AG Ratio: 1 (calc) (ref 1.0–2.5)
ALT: 9 U/L (ref 6–29)
AST: 14 U/L (ref 10–35)
Albumin: 3.7 g/dL (ref 3.6–5.1)
Alkaline phosphatase (APISO): 66 U/L (ref 31–125)
BUN: 15 mg/dL (ref 7–25)
CO2: 25 mmol/L (ref 20–32)
Calcium: 9.4 mg/dL (ref 8.6–10.2)
Chloride: 103 mmol/L (ref 98–110)
Creat: 0.84 mg/dL (ref 0.50–1.10)
Globulin: 3.8 g/dL (calc) — ABNORMAL HIGH (ref 1.9–3.7)
Glucose, Bld: 96 mg/dL (ref 65–99)
Potassium: 3.5 mmol/L (ref 3.5–5.3)
Sodium: 135 mmol/L (ref 135–146)
Total Bilirubin: 0.2 mg/dL (ref 0.2–1.2)
Total Protein: 7.5 g/dL (ref 6.1–8.1)

## 2019-09-10 LAB — HIV-1 RNA QUANT-NO REFLEX-BLD
HIV 1 RNA Quant: 325 copies/mL — ABNORMAL HIGH
HIV-1 RNA Quant, Log: 2.51 Log copies/mL — ABNORMAL HIGH

## 2019-09-10 LAB — CBC
HCT: 36 % (ref 35.0–45.0)
Hemoglobin: 11.5 g/dL — ABNORMAL LOW (ref 11.7–15.5)
MCH: 25.2 pg — ABNORMAL LOW (ref 27.0–33.0)
MCHC: 31.9 g/dL — ABNORMAL LOW (ref 32.0–36.0)
MCV: 78.8 fL — ABNORMAL LOW (ref 80.0–100.0)
MPV: 11 fL (ref 7.5–12.5)
Platelets: 247 10*3/uL (ref 140–400)
RBC: 4.57 10*6/uL (ref 3.80–5.10)
RDW: 16.3 % — ABNORMAL HIGH (ref 11.0–15.0)
WBC: 6.5 10*3/uL (ref 3.8–10.8)

## 2019-09-10 LAB — RPR: RPR Ser Ql: NONREACTIVE

## 2019-09-27 ENCOUNTER — Other Ambulatory Visit: Payer: 59

## 2019-09-27 ENCOUNTER — Telehealth: Payer: Self-pay

## 2019-09-27 ENCOUNTER — Other Ambulatory Visit: Payer: Self-pay

## 2019-09-27 DIAGNOSIS — Z79899 Other long term (current) drug therapy: Secondary | ICD-10-CM

## 2019-09-27 DIAGNOSIS — B20 Human immunodeficiency virus [HIV] disease: Secondary | ICD-10-CM

## 2019-09-27 NOTE — Telephone Encounter (Signed)
Prior authorization initiated on patient's odefsey. Will notify pharmacy and patient when completed.   Gerilyn Stargell Loyola Mast, RN

## 2019-09-28 LAB — T-HELPER CELL (CD4) - (RCID CLINIC ONLY)
CD4 % Helper T Cell: 28 % — ABNORMAL LOW (ref 33–65)
CD4 T Cell Abs: 484 /uL (ref 400–1790)

## 2019-09-28 NOTE — Telephone Encounter (Signed)
Received approval notification via fax. RN notified pharmacy and patient. Andree Coss, RN

## 2019-09-29 LAB — LIPID PANEL
Cholesterol: 207 mg/dL — ABNORMAL HIGH (ref <200)
HDL: 40 mg/dL — ABNORMAL LOW (ref >=50)
LDL Cholesterol (Calc): 144 mg/dL (calc) — ABNORMAL HIGH
Non-HDL Cholesterol (Calc): 167 mg/dL (calc) — ABNORMAL HIGH (ref <130)
Total CHOL/HDL Ratio: 5.2 (calc) — ABNORMAL HIGH (ref <5.0)
Triglycerides: 114 mg/dL (ref <150)

## 2019-09-29 LAB — HIV-1 RNA QUANT-NO REFLEX-BLD
HIV 1 RNA Quant: 466 copies/mL — ABNORMAL HIGH
HIV-1 RNA Quant, Log: 2.67 Log copies/mL — ABNORMAL HIGH

## 2019-10-05 ENCOUNTER — Other Ambulatory Visit: Payer: Self-pay | Admitting: Infectious Diseases

## 2019-10-11 ENCOUNTER — Encounter: Payer: Self-pay | Admitting: Infectious Diseases

## 2019-10-11 ENCOUNTER — Ambulatory Visit (INDEPENDENT_AMBULATORY_CARE_PROVIDER_SITE_OTHER): Payer: 59 | Admitting: Infectious Diseases

## 2019-10-11 ENCOUNTER — Other Ambulatory Visit: Payer: Self-pay

## 2019-10-11 DIAGNOSIS — M79604 Pain in right leg: Secondary | ICD-10-CM

## 2019-10-11 DIAGNOSIS — B20 Human immunodeficiency virus [HIV] disease: Secondary | ICD-10-CM

## 2019-10-11 DIAGNOSIS — I10 Essential (primary) hypertension: Secondary | ICD-10-CM

## 2019-10-11 NOTE — Assessment & Plan Note (Signed)
Will send her for u/s and for ortho eval.

## 2019-10-11 NOTE — Assessment & Plan Note (Signed)
She will f/yu with PCP

## 2019-10-11 NOTE — Progress Notes (Signed)
   Subjective:    Patient ID: Royann Shivers, female    DOB: 1970/10/01, 49 y.o.   MRN: [id number]  HPI 49yo F with HIV+, previously started on complera -->odefsy, HTN and anxiety.   Is having R knee pain, is considering surgery:  Has been walking with a cain since her last visit. Her leg is swollen, she is "popping pills". Previously saw MD but he has retired Copywriter, advertising). "I need to go to people that I trust".  She quit her last u/s 2019.   Has child/son with cerebral palsy at home.   Last mammo 07-2017 Last PAP was done by NP at [facility] 2019.  HIV 1 RNA Quant (copies/mL)  Date Value  09/27/2019 466 (H)  09/06/2019 325 (H)  08/24/2018 20,800 (H)   CD4 T Cell Abs (/uL)  Date Value  09/27/2019 484  09/06/2019 786  12/02/2018 461    Review of Systems  Constitutional: Negative for appetite change and unexpected weight change.  Respiratory: Negative for cough and shortness of breath.   Cardiovascular: Positive for leg swelling.  Gastrointestinal: Negative for constipation and diarrhea.  Genitourinary: Negative for difficulty urinating.  Musculoskeletal: Positive for arthralgias.  Please see HPI. All other systems reviewed and negative.     Objective:   Physical Exam Constitutional:      Appearance: Normal appearance. She is obese.  HENT:     Mouth/Throat:     Mouth: Mucous membranes are moist.     Pharynx: No oropharyngeal exudate.  Eyes:     Extraocular Movements: Extraocular movements intact.     Pupils: Pupils are equal, round, and reactive to light.  Cardiovascular:     Rate and Rhythm: Normal rate and regular rhythm.  Pulmonary:     Effort: Pulmonary effort is normal.     Breath sounds: Normal breath sounds.  Abdominal:     General: Bowel sounds are normal. There is no distension.     Palpations: Abdomen is soft.     Tenderness: There is no abdominal tenderness.  Musculoskeletal:     Cervical back: Normal range of motion and neck supple.     Right lower  leg: Edema present.     Left lower leg: Edema present.  Neurological:     Mental Status: She is alert.  Psychiatric:        Behavior: Behavior normal.       Assessment & Plan:

## 2019-10-11 NOTE — Assessment & Plan Note (Addendum)
Will check her Genotype She may need to be on INI rx.  appt for mammo next week Needs PAP, will schedule in clinlc Encouraged to take COVID vax.  Will see her back in 1 month.

## 2019-10-14 ENCOUNTER — Encounter: Payer: Self-pay | Admitting: Infectious Diseases

## 2019-10-14 ENCOUNTER — Other Ambulatory Visit: Payer: Self-pay

## 2019-10-14 ENCOUNTER — Ambulatory Visit (INDEPENDENT_AMBULATORY_CARE_PROVIDER_SITE_OTHER): Payer: 59 | Admitting: Infectious Diseases

## 2019-10-14 ENCOUNTER — Ambulatory Visit (HOSPITAL_COMMUNITY)
Admission: RE | Admit: 2019-10-14 | Discharge: 2019-10-14 | Disposition: A | Discharge status: Home / Self Care | Payer: 59 | Source: Ambulatory Visit | Attending: Infectious Diseases | Admitting: Infectious Diseases

## 2019-10-14 ENCOUNTER — Other Ambulatory Visit (HOSPITAL_COMMUNITY)
Admission: RE | Admit: 2019-10-14 | Discharge: 2019-10-14 | Disposition: A | Discharge status: Home / Self Care | Payer: 59 | Source: Ambulatory Visit | Attending: Infectious Diseases | Admitting: Infectious Diseases

## 2019-10-14 DIAGNOSIS — M79604 Pain in right leg: Secondary | ICD-10-CM | POA: Diagnosis present

## 2019-10-14 DIAGNOSIS — R198 Other specified symptoms and signs involving the digestive system and abdomen: Secondary | ICD-10-CM | POA: Insufficient documentation

## 2019-10-14 DIAGNOSIS — Z113 Encounter for screening for infections with a predominantly sexual mode of transmission: Secondary | ICD-10-CM | POA: Diagnosis not present

## 2019-10-14 DIAGNOSIS — Z124 Encounter for screening for malignant neoplasm of cervix: Secondary | ICD-10-CM | POA: Diagnosis not present

## 2019-10-14 DIAGNOSIS — B20 Human immunodeficiency virus [HIV] disease: Secondary | ICD-10-CM

## 2019-10-14 NOTE — Progress Notes (Signed)
Lower venous duplex.       has been completed. Preliminary results can be found under CV proc through chart review. Jeb Levering, BS, RDMS, RVT    Attempted call report to Dr. Ninetta Lights but office is closed.

## 2019-10-14 NOTE — Assessment & Plan Note (Signed)
Normal pelvic exam without any signs of rectocele.   Cervical brushing collected for cytology and HPV.  Discussed recommended screening interval for women living with HIV disease meant to be lifelong and at an interval of Q1-3 years pending results. Acceptable to space out Q3y with 3 consecutively normal exams. Further recommendations for Anita Pacheco to follow today's results.  Results will be communicated to the patient via telephone call.

## 2019-10-14 NOTE — Assessment & Plan Note (Signed)
Viremia on Odefsey ~400 copies - scheduled back with Dr. Ninetta Lights in a few weeks to review genotype for possible medication change.

## 2019-10-14 NOTE — Progress Notes (Signed)
Subjective:    Anita Pacheco is a 49 y.o. female here for an annual pelvic exam and pap smear.   Review of Systems: Current GYN complaints or concerns: trouble with painful passing of bowel movements x 1 month. Initially presented to be dysuria and lower abdominal complaints. Was given a course of Cephalexin with notable improvement.   She was initially wondering if it was due to her adding tonic water to regimen but since stopping that she has continued to have intermittent pains. Estimates 2-4 BMs a day with "very regular habits" -- on a new route for work however and no access to the bathroom during the day. Always achieves 2 soft easy to pass painless BMs in the mornings before work then she immediately goes home after to sit in the bathroom to try to pass another BM. Sometimes these are the painful ones. She does occasionally have to retain BMs throughout the day due to the lack of bathroom access.   Not sexually active since her husband passed; no vaginal discharge. She continues to get very heavy menstrual cycles that are irregular with painful cramping. Had BTL 20 years ago.     Past Medical History:  Diagnosis Date  . ANXIETY 07/22/2006  . ASTHMA 07/22/2006  . Breast mass 04/25/2011  . Carpal tunnel syndrome 07/22/2006  . HERPES ZOSTER 05/28/2010  . HIV (human immunodeficiency virus infection) (HCC)   . Hypertension   . HYPERTENSION 07/22/2006  . PERIPHERAL NEUROPATHY 07/03/2008    Gynecologic History: No obstetric history on file.  No LMP recorded. Contraception: tubal ligation Last Pap: 2017. Results were: normal Anal Intercourse: Last Mammogram: 10/19/2019 scheduled; 07/2017 - normal    Objective:  Physical Exam  Constitutional: Well developed, well nourished, no acute distress. She is alert and oriented x3.  Pelvic: External genitalia is normal in appearance. The vagina is normal in appearance. The cervix is bulbous and easily visualized. No CMT, normal expected  cervical mucus present. Bimanual exam reveals uterus that is felt to be normal size, shape, and contour. No adnexal masses or tenderness noted. Breasts: symmetrical in contour, shape and texture. No palpable masses/nodules. No nipple discharge.  Psych: She has a normal mood and affect.     Assessment & Plan:    Problem List Items Addressed This Visit      Unprioritized   Screening for cervical cancer    Normal pelvic exam without any signs of rectocele.   Cervical brushing collected for cytology and HPV.  Discussed recommended screening interval for women living with HIV disease meant to be lifelong and at an interval of Q1-3 years pending results. Acceptable to space out Q3y with 3 consecutively normal exams. Further recommendations for Dennis Wardrop to follow today's results.  Results will be communicated to the patient via telephone call.        Relevant Orders   Cytology - PAP( [place])   Routine screening for STI (sexually transmitted infection) - Primary   Relevant Orders   Cervicovaginal ancillary only( [place])   Pain associated with defecation    Treated for UTI 75m ago with improvement in bladder symptoms but now describes ongoing intermitent pain with BMs specifically in the evening when she gets home from work. She may be having pain due to retention of BMs as she does not have bathroom access vs associated pain with menstrual cramping that she describes to be severe. Encouraged her to continue following for symptoms and patterns - may need  referral to GI to evaluate. Will be due for screening colonoscopy this year.       Human immunodeficiency virus (HIV) disease (HCC) (Chronic)    Viremia on Odefsey ~400 copies - scheduled back with Dr. Johnnye Sima in a few weeks to review genotype for possible medication change.             Janene Madeira, MSN, NP-C [facility] for [facility] [place] [facility] Office: [phone] Pager:  [phone]  10/14/19 12:25 PM

## 2019-10-14 NOTE — Assessment & Plan Note (Signed)
Treated for UTI 6m ago with improvement in bladder symptoms but now describes ongoing intermitent pain with BMs specifically in the evening when she gets home from work. She may be having pain due to retention of BMs as she does not have bathroom access vs associated pain with menstrual cramping that she describes to be severe. Encouraged her to continue following for symptoms and patterns - may need referral to GI to evaluate. Will be due for screening colonoscopy this year.

## 2019-10-14 NOTE — Patient Instructions (Addendum)
Nice to meet you!  Please   For your MyChart: Login: [email or web address]  Password: [email or web address]   You can go in and change it when you are able.   Will call you with results of today's test and when to repeat the screening.

## 2019-10-15 LAB — HIV-1 RNA ULTRAQUANT REFLEX TO GENTYP+
HIV 1 RNA Quant: 316 copies/mL — ABNORMAL HIGH
HIV-1 RNA Quant, Log: 2.5 Log copies/mL — ABNORMAL HIGH

## 2019-10-17 ENCOUNTER — Ambulatory Visit (INDEPENDENT_AMBULATORY_CARE_PROVIDER_SITE_OTHER): Payer: 59

## 2019-10-17 ENCOUNTER — Encounter: Payer: Self-pay | Admitting: Orthopedic Surgery

## 2019-10-17 ENCOUNTER — Other Ambulatory Visit: Payer: Self-pay | Admitting: Infectious Diseases

## 2019-10-17 ENCOUNTER — Ambulatory Visit (INDEPENDENT_AMBULATORY_CARE_PROVIDER_SITE_OTHER): Payer: 59 | Admitting: Orthopedic Surgery

## 2019-10-17 ENCOUNTER — Other Ambulatory Visit: Payer: Self-pay

## 2019-10-17 VITALS — Ht 67.5 in | Wt 298.0 lb

## 2019-10-17 DIAGNOSIS — M1711 Unilateral primary osteoarthritis, right knee: Secondary | ICD-10-CM | POA: Diagnosis not present

## 2019-10-17 DIAGNOSIS — M25561 Pain in right knee: Secondary | ICD-10-CM

## 2019-10-17 DIAGNOSIS — G8929 Other chronic pain: Secondary | ICD-10-CM | POA: Diagnosis not present

## 2019-10-17 LAB — CERVICOVAGINAL ANCILLARY ONLY
Bacterial Vaginitis (gardnerella): NEGATIVE
Candida Glabrata: NEGATIVE
Candida Vaginitis: POSITIVE — AB
Chlamydia: NEGATIVE
Comment: NEGATIVE
Comment: NEGATIVE
Comment: NEGATIVE
Comment: NEGATIVE
Comment: NEGATIVE
Comment: NORMAL
Neisseria Gonorrhea: NEGATIVE
Trichomonas: NEGATIVE

## 2019-10-17 LAB — CYTOLOGY - PAP
Comment: NEGATIVE
Diagnosis: NEGATIVE
High risk HPV: NEGATIVE

## 2019-10-17 MED ORDER — FLUCONAZOLE 150 MG PO TABS: 150.0000 mg | ORAL_TABLET | Freq: Every day | ORAL | 1 refills | Status: DC
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
## 2019-10-17 NOTE — Progress Notes (Signed)
Normal cytology and negative HPV; repeat in 3 years.  She was still describing some abnormal intermittent discomfort with GU - candida noted on cervicovaginal swab in the setting of recent antibiotic use. Will treat with Diflucan 150 mg x 1 MyChart message was sent to the patient discussing results/recommendations.

## 2019-10-19 ENCOUNTER — Other Ambulatory Visit: Payer: Self-pay

## 2019-10-19 ENCOUNTER — Ambulatory Visit
Admission: RE | Admit: 2019-10-19 | Discharge: 2019-10-19 | Disposition: A | Discharge status: Home / Self Care | Payer: 59 | Source: Ambulatory Visit | Attending: Infectious Diseases | Admitting: Infectious Diseases

## 2019-10-19 DIAGNOSIS — B20 Human immunodeficiency virus [HIV] disease: Secondary | ICD-10-CM

## 2019-11-04 ENCOUNTER — Other Ambulatory Visit: Payer: Self-pay | Admitting: Infectious Diseases

## 2019-11-04 DIAGNOSIS — B20 Human immunodeficiency virus [HIV] disease: Secondary | ICD-10-CM

## 2019-11-08 ENCOUNTER — Other Ambulatory Visit: Payer: Self-pay

## 2019-11-08 ENCOUNTER — Encounter: Payer: Self-pay | Admitting: Infectious Diseases

## 2019-11-08 ENCOUNTER — Ambulatory Visit (INDEPENDENT_AMBULATORY_CARE_PROVIDER_SITE_OTHER): Payer: 59 | Admitting: Infectious Diseases

## 2019-11-08 DIAGNOSIS — I10 Essential (primary) hypertension: Secondary | ICD-10-CM

## 2019-11-08 DIAGNOSIS — M79604 Pain in right leg: Secondary | ICD-10-CM | POA: Diagnosis not present

## 2019-11-08 DIAGNOSIS — B20 Human immunodeficiency virus [HIV] disease: Secondary | ICD-10-CM

## 2019-11-08 DIAGNOSIS — N63 Unspecified lump in unspecified breast: Secondary | ICD-10-CM | POA: Diagnosis not present

## 2019-11-08 DIAGNOSIS — F411 Generalized anxiety disorder: Secondary | ICD-10-CM

## 2019-11-08 DIAGNOSIS — Z68.41 Body mass index (BMI) pediatric, greater than or equal to 95th percentile for age: Secondary | ICD-10-CM

## 2019-11-08 NOTE — Assessment & Plan Note (Signed)
I encouraged her to take her rx's as written

## 2019-11-08 NOTE — Assessment & Plan Note (Addendum)
Made it very clear that if she misses her current ART she will develop resisitance.  She gets reminders from her drug store.  I suggested have adherence person visit with her.  She defers idea of injectable med.  rtc in 3-4 months.

## 2019-11-08 NOTE — Assessment & Plan Note (Signed)
Most recent mammo nl.  Cont to watch.

## 2019-11-08 NOTE — Progress Notes (Signed)
   Subjective:    Patient ID: Anita Pacheco, female    DOB: [birth year], 49 y.o.   MRN: [id number]  HPI 49yo F with HIV+, previously started on complera -->odefsy, HTN and anxiety.  has been taking meds irregularly.  Has been having pain in her R side, "going numb." especially her leg. Taking 4 naproxen/day. Attributes fatigue to this. Makes it hard for her to work.  Gets off work around 2am.  No prev MRI or radiographs. She was prev rec to have steroid injections by her PCP. Told she would eventually need surgery.  Has child/son with cerebral palsy at home.   Last mammo 09-2019- NL Last PAP was done by NP at [facility] 09-2019.NL COVID vax [id number] x1. 2nd due next week.    HIV 1 RNA Quant (copies/mL)  Date Value  10/11/2019 316 (H)  09/27/2019 466 (H)  09/06/2019 325 (H)   CD4 T Cell Abs (/uL)  Date Value  09/27/2019 484  09/06/2019 786  12/02/2018 461    Review of Systems  Constitutional: Negative for appetite change and unexpected weight change.  Gastrointestinal: Negative for constipation and diarrhea.  Genitourinary: Negative for difficulty urinating.  Musculoskeletal: Positive for arthralgias and back pain.       Objective:   Physical Exam Constitutional:      General: She is not in acute distress.    Appearance: Normal appearance. She is obese.  HENT:     Mouth/Throat:     Mouth: Mucous membranes are moist.     Pharynx: No oropharyngeal exudate.  Eyes:     Extraocular Movements: Extraocular movements intact.     Pupils: Pupils are equal, round, and reactive to light.  Cardiovascular:     Rate and Rhythm: Normal rate and regular rhythm.  Pulmonary:     Effort: Pulmonary effort is normal.     Breath sounds: Normal breath sounds.  Abdominal:     General: Bowel sounds are normal. There is no distension.     Palpations: Abdomen is soft.     Tenderness: There is no abdominal tenderness.  Musculoskeletal:     Cervical back: Normal range of motion and neck  supple.     Right lower leg: No edema.     Left lower leg: No edema.  Neurological:     Mental Status: She is alert.  Psychiatric:     Comments: At baseline           Assessment & Plan:

## 2019-11-08 NOTE — Assessment & Plan Note (Signed)
Has improved as COVID has decreased.

## 2019-11-08 NOTE — Assessment & Plan Note (Signed)
Will have her f/u with PCP.  

## 2019-11-08 NOTE — Assessment & Plan Note (Addendum)
Encourage to lose wt (exercise, watch diet), f/u with PCP.

## 2019-12-12 ENCOUNTER — Other Ambulatory Visit: Payer: Self-pay | Admitting: Infectious Diseases

## 2019-12-12 DIAGNOSIS — B20 Human immunodeficiency virus [HIV] disease: Secondary | ICD-10-CM

## 2020-02-07 ENCOUNTER — Ambulatory Visit: Payer: 59

## 2020-02-14 ENCOUNTER — Other Ambulatory Visit: Payer: 59

## 2020-02-14 ENCOUNTER — Ambulatory Visit: Payer: 59

## 2020-02-14 ENCOUNTER — Other Ambulatory Visit: Payer: Self-pay

## 2020-02-14 DIAGNOSIS — B20 Human immunodeficiency virus [HIV] disease: Secondary | ICD-10-CM

## 2020-02-15 ENCOUNTER — Encounter: Payer: Self-pay | Admitting: Infectious Diseases

## 2020-02-15 LAB — T-HELPER CELL (CD4) - (RCID CLINIC ONLY)
CD4 % Helper T Cell: 27 % — ABNORMAL LOW (ref 33–65)
CD4 T Cell Abs: 493 /uL (ref 400–1790)

## 2020-02-17 LAB — CBC
HCT: 33.9 % — ABNORMAL LOW (ref 35.0–45.0)
Hemoglobin: 10.6 g/dL — ABNORMAL LOW (ref 11.7–15.5)
MCH: 24.4 pg — ABNORMAL LOW (ref 27.0–33.0)
MCHC: 31.3 g/dL — ABNORMAL LOW (ref 32.0–36.0)
MCV: 77.9 fL — ABNORMAL LOW (ref 80.0–100.0)
MPV: 10.5 fL (ref 7.5–12.5)
Platelets: 247 10*3/uL (ref 140–400)
RBC: 4.35 10*6/uL (ref 3.80–5.10)
RDW: 16.2 % — ABNORMAL HIGH (ref 11.0–15.0)
WBC: 4.5 10*3/uL (ref 3.8–10.8)

## 2020-02-17 LAB — COMPLETE METABOLIC PANEL WITH GFR
AG Ratio: 1 (calc) (ref 1.0–2.5)
ALT: 11 U/L (ref 6–29)
AST: 13 U/L (ref 10–35)
Albumin: 3.6 g/dL (ref 3.6–5.1)
Alkaline phosphatase (APISO): 61 U/L (ref 31–125)
BUN: 10 mg/dL (ref 7–25)
CO2: 27 mmol/L (ref 20–32)
Calcium: 8.8 mg/dL (ref 8.6–10.2)
Chloride: 103 mmol/L (ref 98–110)
Creat: 0.77 mg/dL (ref 0.50–1.10)
GFR, Est African American: 105 mL/min/{1.73_m2} (ref >=60)
GFR, Est Non African American: 91 mL/min/{1.73_m2} (ref >=60)
Globulin: 3.7 g/dL (calc) (ref 1.9–3.7)
Glucose, Bld: 98 mg/dL (ref 65–99)
Potassium: 3.5 mmol/L (ref 3.5–5.3)
Sodium: 138 mmol/L (ref 135–146)
Total Bilirubin: 0.3 mg/dL (ref 0.2–1.2)
Total Protein: 7.3 g/dL (ref 6.1–8.1)

## 2020-02-17 LAB — HIV-1 RNA QUANT-NO REFLEX-BLD
HIV 1 RNA Quant: 289 Copies/mL — ABNORMAL HIGH
HIV-1 RNA Quant, Log: 2.46 Log cps/mL — ABNORMAL HIGH

## 2020-02-28 ENCOUNTER — Encounter: Payer: 59 | Admitting: Infectious Diseases

## 2020-03-12 ENCOUNTER — Ambulatory Visit: Payer: 59 | Admitting: Infectious Diseases

## 2020-03-19 ENCOUNTER — Other Ambulatory Visit: Payer: Self-pay

## 2020-03-19 ENCOUNTER — Ambulatory Visit (INDEPENDENT_AMBULATORY_CARE_PROVIDER_SITE_OTHER): Payer: 59 | Admitting: Infectious Diseases

## 2020-03-19 VITALS — HR 91 | Wt 299.0 lb

## 2020-03-19 DIAGNOSIS — B20 Human immunodeficiency virus [HIV] disease: Secondary | ICD-10-CM | POA: Diagnosis not present

## 2020-03-19 DIAGNOSIS — K219 Gastro-esophageal reflux disease without esophagitis: Secondary | ICD-10-CM | POA: Diagnosis not present

## 2020-03-19 DIAGNOSIS — Z113 Encounter for screening for infections with a predominantly sexual mode of transmission: Secondary | ICD-10-CM | POA: Diagnosis not present

## 2020-03-19 DIAGNOSIS — G609 Hereditary and idiopathic neuropathy, unspecified: Secondary | ICD-10-CM

## 2020-03-19 DIAGNOSIS — I10 Essential (primary) hypertension: Secondary | ICD-10-CM | POA: Diagnosis not present

## 2020-03-19 DIAGNOSIS — J4520 Mild intermittent asthma, uncomplicated: Secondary | ICD-10-CM

## 2020-03-19 DIAGNOSIS — Z79899 Other long term (current) drug therapy: Secondary | ICD-10-CM

## 2020-03-19 MED ORDER — PREGABALIN 50 MG PO CAPS: 50.0000 mg | ORAL_CAPSULE | Freq: Every evening | ORAL | 3 refills | Status: DC | PRN

## 2020-03-19 MED ORDER — ALBUTEROL SULFATE HFA 108 (90 BASE) MCG/ACT IN AERS: 2.0000 | INHALATION_SPRAY | Freq: Four times a day (QID) | RESPIRATORY_TRACT | 2 refills | Status: DC | PRN

## 2020-03-19 MED ORDER — MOMETASONE FURO-FORMOTEROL FUM 100-5 MCG/ACT IN AERO: 2.0000 | INHALATION_SPRAY | Freq: Two times a day (BID) | RESPIRATORY_TRACT | 5 refills | Status: DC

## 2020-03-19 MED ORDER — OMEPRAZOLE 40 MG PO CPDR: 40.0000 mg | DELAYED_RELEASE_CAPSULE | Freq: Every day | ORAL | 3 refills | Status: DC

## 2020-03-19 NOTE — Assessment & Plan Note (Signed)
Will start protonix If not improved, will have her seen by GI.

## 2020-03-19 NOTE — Assessment & Plan Note (Signed)
Continues to have irregular adherence She has received 2 covid vax, she does not qualify for booster.  encouraged her to take ART daily without missed doses.  Last pap, mammo normal.  rtc in 3-4 months.

## 2020-03-19 NOTE — Assessment & Plan Note (Signed)
Will refill her amlodipine.

## 2020-03-19 NOTE — Assessment & Plan Note (Signed)
Albuterol, dulera refilled.

## 2020-03-19 NOTE — Progress Notes (Signed)
   Subjective:    Patient ID: Anita Pacheco, female    DOB: 01/02/1971, 49 y.o.   MRN: [id number]  HPI 49yo F with HIV+, previously started on complera -->odefsy, HTN and anxiety.  Believes her rx are making her sick.  Has been drinking cellery juice, chia seeds.   Every AM on waking, feels dizzy and nauseated. After vomiting, she feels better.  Still having weakness, numbness, cramping on R side. Dizziness. Feels jittery rest of the day.   Has child/son with cerebral palsy at home.   Last mammo 09-2019- NL Last PAP was done by NP at [facility] 09-2019.NL COVID vax completed 10-2019   Review of Systems  Constitutional: Negative for appetite change and unexpected weight change.  Gastrointestinal: Positive for nausea and vomiting. Negative for constipation and diarrhea.  Genitourinary: Positive for frequency. Negative for difficulty urinating.  Musculoskeletal: Positive for myalgias.  Psychiatric/Behavioral: Positive for sleep disturbance.       Objective:   Physical Exam Vitals reviewed.  HENT:     Mouth/Throat:     Mouth: Mucous membranes are moist.     Pharynx: No oropharyngeal exudate.  Eyes:     Extraocular Movements: Extraocular movements intact.     Pupils: Pupils are equal, round, and reactive to light.  Cardiovascular:     Rate and Rhythm: Normal rate and regular rhythm.  Pulmonary:     Effort: Pulmonary effort is normal.     Breath sounds: Normal breath sounds.  Abdominal:     General: Bowel sounds are normal. There is no distension.     Palpations: Abdomen is soft.     Tenderness: There is no abdominal tenderness.  Musculoskeletal:        General: Normal range of motion.     Cervical back: Normal range of motion and neck supple.     Right lower leg: Edema present.     Left lower leg: Edema present.  Neurological:     General: No focal deficit present.           Assessment & Plan:

## 2020-03-20 ENCOUNTER — Other Ambulatory Visit: Payer: Self-pay

## 2020-03-20 DIAGNOSIS — J4520 Mild intermittent asthma, uncomplicated: Secondary | ICD-10-CM

## 2020-03-20 MED ORDER — MOMETASONE FURO-FORMOTEROL FUM 100-5 MCG/ACT IN AERO: 2.0000 | INHALATION_SPRAY | Freq: Two times a day (BID) | RESPIRATORY_TRACT | 5 refills | Status: DC

## 2020-03-23 ENCOUNTER — Telehealth: Payer: Self-pay | Admitting: *Deleted

## 2020-03-23 NOTE — Telephone Encounter (Signed)
Faxed PA for [facility] to [facility] at [phone]. Andree Coss, RN

## 2020-03-28 NOTE — Telephone Encounter (Signed)
Received denial from [facility]. Insurance is requiring preferred alternative.  Covered alternatives  Fluticasone/Salmeterol (generic for Airduo) Advair Breo Budesonide- Formeterol Trelegy Will forward message to MD Lorenso Courier, CMA

## 2020-04-02 NOTE — Telephone Encounter (Signed)
Fluticasone/salmeterol

## 2020-04-04 ENCOUNTER — Other Ambulatory Visit: Payer: Self-pay | Admitting: Infectious Diseases

## 2020-04-04 DIAGNOSIS — J4520 Mild intermittent asthma, uncomplicated: Secondary | ICD-10-CM

## 2020-04-04 MED ORDER — FLUTICASONE-SALMETEROL 113-14 MCG/ACT IN AEPB: 1.0000 | INHALATION_SPRAY | Freq: Every day | RESPIRATORY_TRACT | 3 refills | Status: DC

## 2020-04-04 NOTE — Telephone Encounter (Signed)
Done

## 2020-04-04 NOTE — Telephone Encounter (Signed)
Please enter prescription for patient.

## 2020-04-26 ENCOUNTER — Other Ambulatory Visit: Payer: Self-pay

## 2020-04-26 DIAGNOSIS — G609 Hereditary and idiopathic neuropathy, unspecified: Secondary | ICD-10-CM

## 2020-04-26 MED ORDER — PREGABALIN 50 MG PO CAPS: 50.0000 mg | ORAL_CAPSULE | Freq: Every evening | ORAL | 1 refills | Status: DC | PRN

## 2020-04-26 NOTE — Progress Notes (Signed)
Anita Pacheco with [facility] called office stating pt would like Lyrica sent to local pharmacy. Prescription is not on file.  Called [facility] in [place] with new prescription.

## 2020-04-30 ENCOUNTER — Telehealth: Payer: Self-pay

## 2020-04-30 NOTE — Telephone Encounter (Signed)
Prior authorization initiated for pregabalin via cover my meds. Key: [id number]. This request is still being processed. Will continue to follow.  Valarie Cones

## 2020-05-04 NOTE — Telephone Encounter (Signed)
PA denied; patient needs to show evidence of trying formulary options and failing prior to lyrica being approved. Paperwork with other options covered by insurance in provider box.   Donney Caraveo Loyola Mast, RN

## 2020-05-07 NOTE — Telephone Encounter (Signed)
Thank you, will review options with pt and available options.

## 2020-05-21 IMAGING — MG DIGITAL SCREENING BILAT W/ TOMO W/ CAD
8 series · 8 of 24 positions shown · non-contrast
Comparison: Previous exam(s).

CLINICAL DATA: Screening.

EXAM:
DIGITAL SCREENING BILATERAL MAMMOGRAM WITH TOMO AND CAD

[R CC synth-2D]
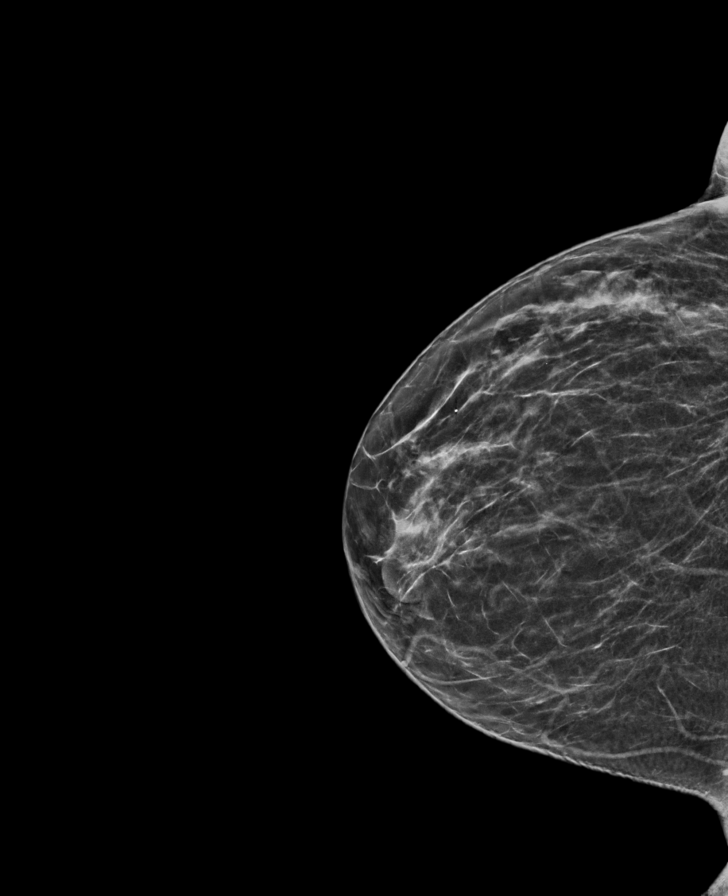

[R MLO synth-2D]
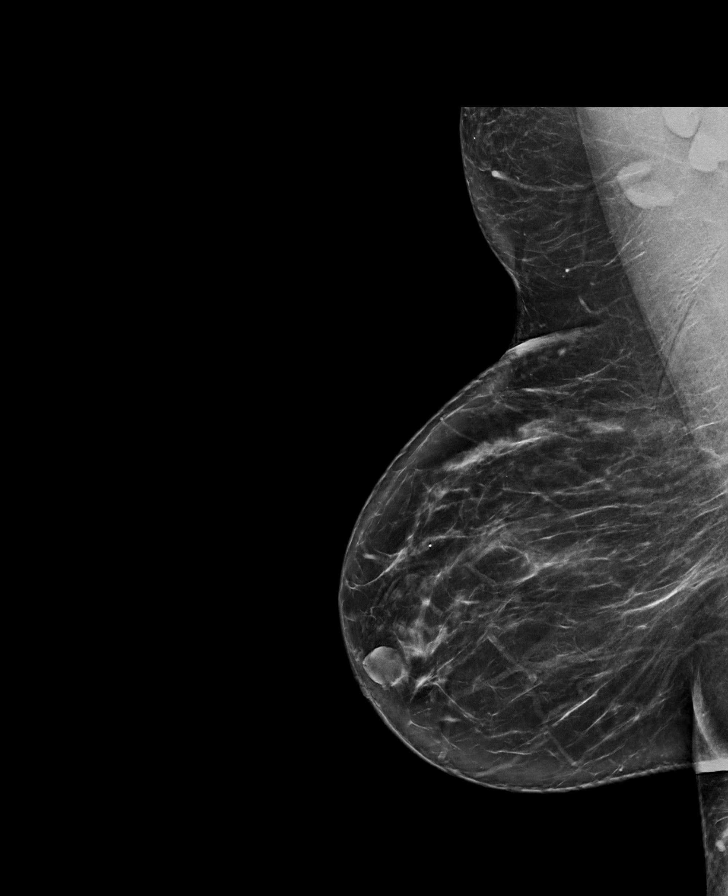

[L CC synth-2D]
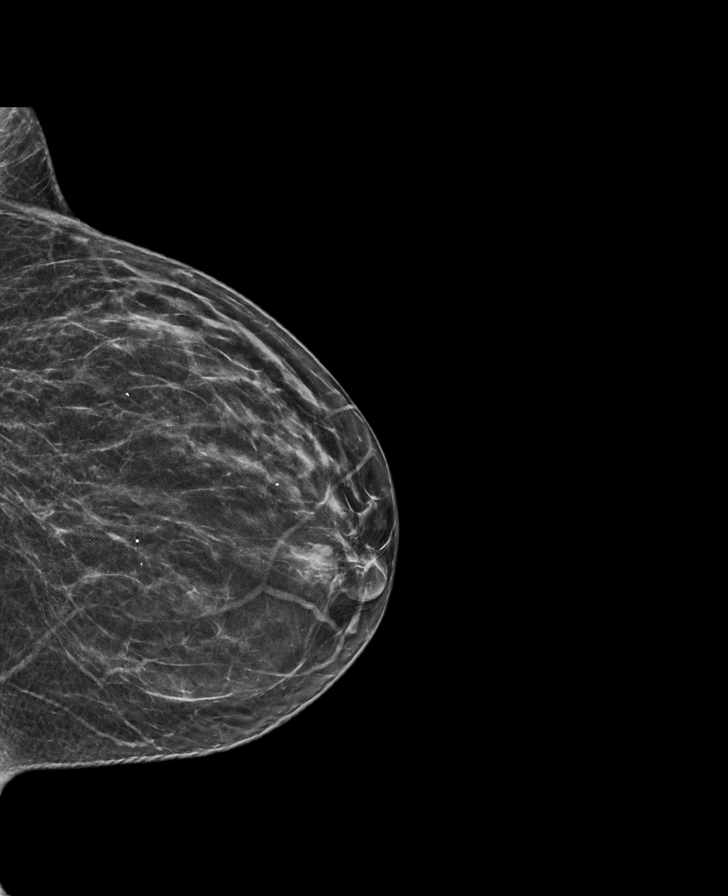

[L MLO synth-2D]
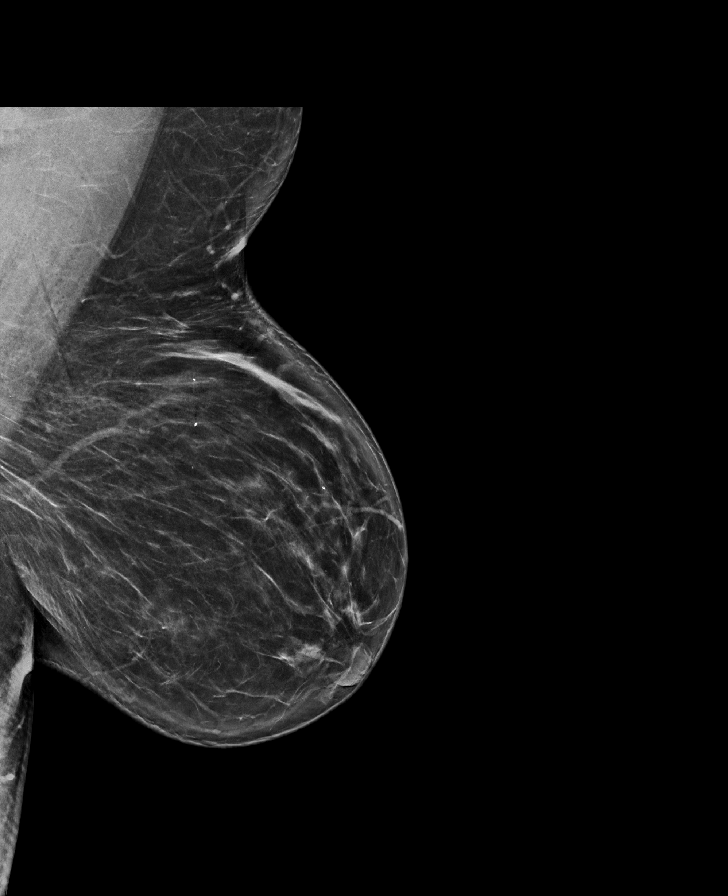

[L CC tomo · tomo slice 30/59.0]
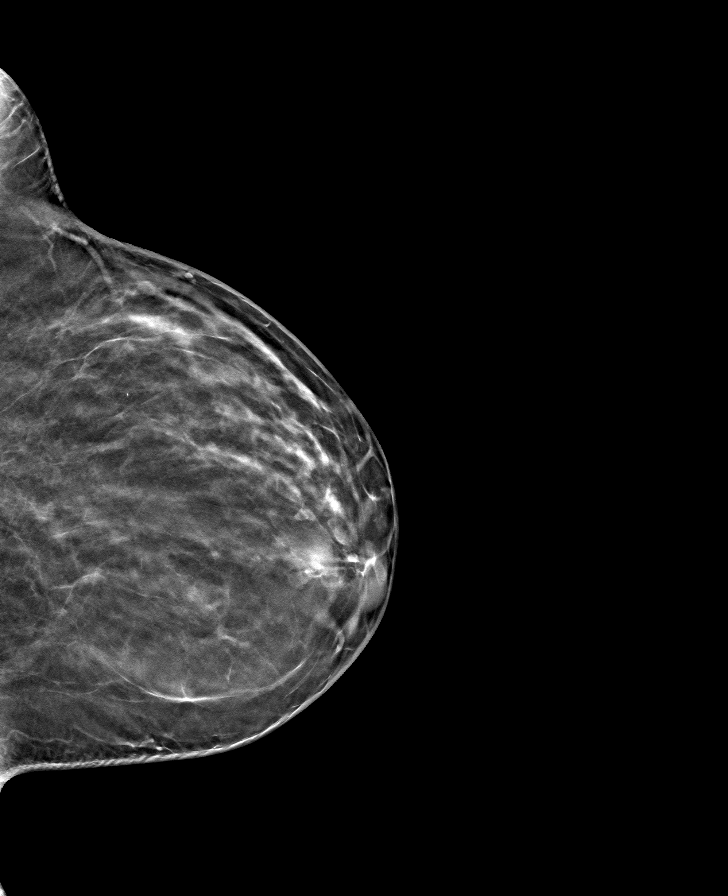

[L MLO tomo · tomo slice 39/78.0]
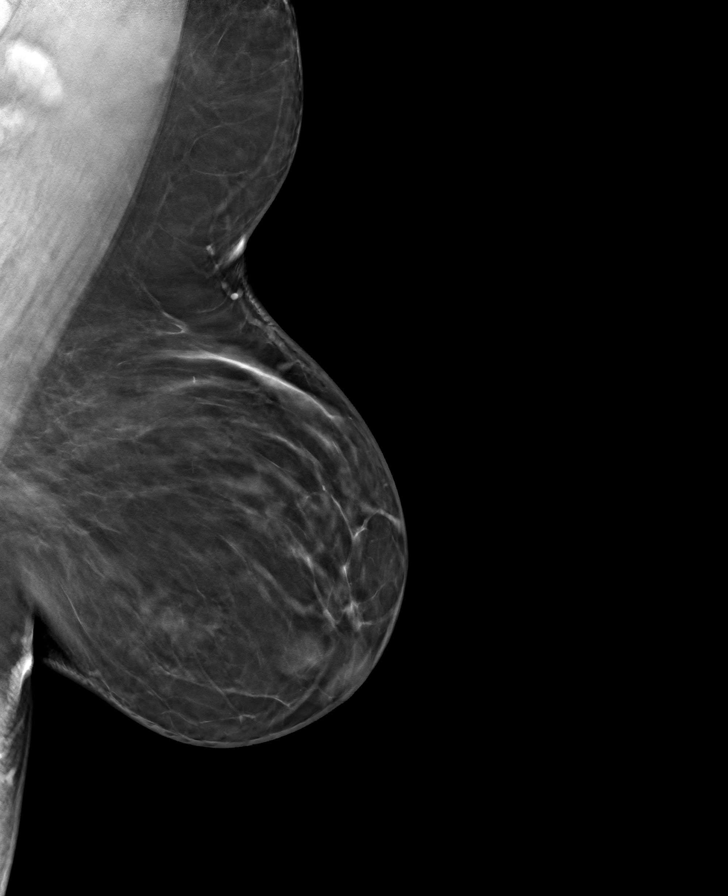

[R CC tomo · tomo slice 29/58.0]
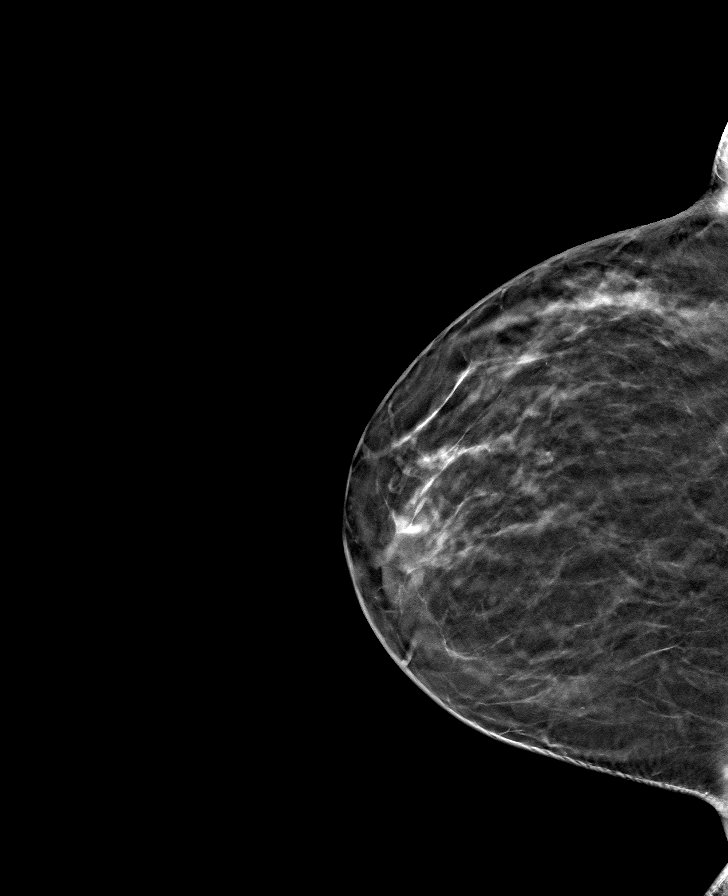

[R MLO tomo · tomo slice 40/79.0]
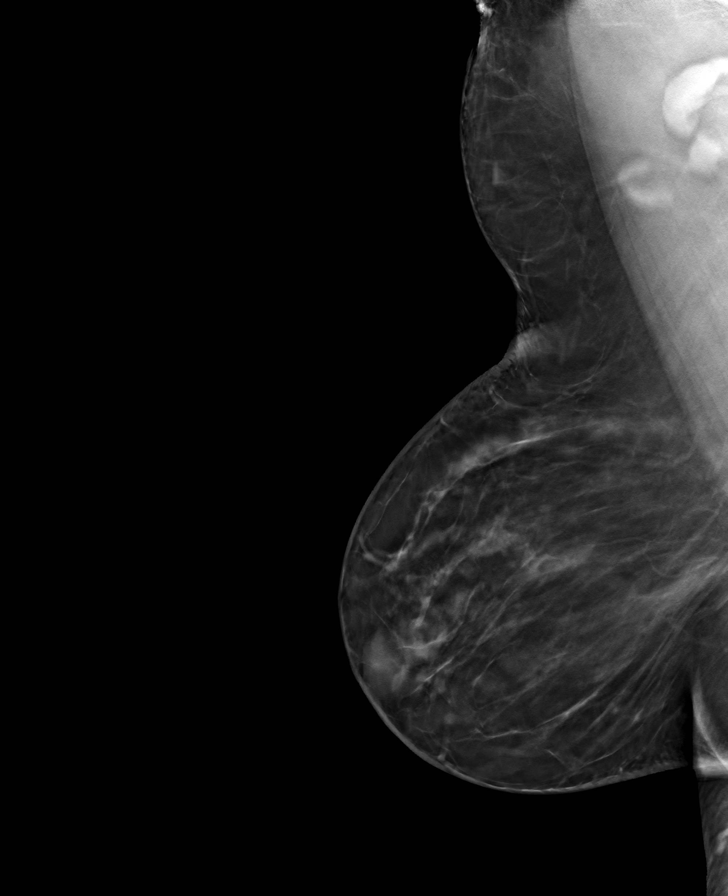

[8 of 24 positions shown; findings below may reference images not displayed]

ACR Breast Density Category b: There are scattered areas of
fibroglandular density.
FINDINGS: There are no findings suspicious for malignancy. Images were
processed with CAD.
IMPRESSION: No mammographic evidence of malignancy. A result letter of this
screening mammogram will be mailed directly to the patient.

RECOMMENDATION:
Screening mammogram in one year. (Code:[id number]-775)

BI-RADS CATEGORY  1: Negative.

## 2020-05-29 ENCOUNTER — Other Ambulatory Visit: Payer: Self-pay | Admitting: Infectious Diseases

## 2020-05-29 DIAGNOSIS — B20 Human immunodeficiency virus [HIV] disease: Secondary | ICD-10-CM

## 2020-07-06 ENCOUNTER — Ambulatory Visit: Payer: 59

## 2020-07-06 ENCOUNTER — Ambulatory Visit: Payer: 59 | Admitting: Infectious Diseases

## 2020-07-06 ENCOUNTER — Other Ambulatory Visit: Payer: Self-pay

## 2020-07-10 ENCOUNTER — Ambulatory Visit: Payer: 59

## 2020-07-10 ENCOUNTER — Other Ambulatory Visit: Payer: Self-pay

## 2020-07-10 ENCOUNTER — Ambulatory Visit: Payer: 59 | Admitting: Infectious Diseases

## 2020-07-10 ENCOUNTER — Telehealth: Payer: Self-pay

## 2020-07-10 NOTE — Telephone Encounter (Signed)
[facility] Patient Advocate Encounter   Was successful in obtaining a [employer] copay card for $7200.  This copay card will make the patients copay 0.00.    RxBin: [id number] PCN: ACCESS Member ID: [id number] Group ID: [id number]  Clearance Coots, CPhT Specialty Pharmacy Patient Advocate [facility] Phone: [phone] Fax:  [phone]

## 2020-07-25 ENCOUNTER — Other Ambulatory Visit: Payer: Self-pay | Admitting: Infectious Diseases

## 2020-07-25 DIAGNOSIS — J4520 Mild intermittent asthma, uncomplicated: Secondary | ICD-10-CM

## 2020-08-03 ENCOUNTER — Ambulatory Visit: Payer: 59 | Admitting: Infectious Diseases

## 2020-08-16 ENCOUNTER — Other Ambulatory Visit: Payer: Self-pay

## 2020-08-16 ENCOUNTER — Ambulatory Visit (INDEPENDENT_AMBULATORY_CARE_PROVIDER_SITE_OTHER): Payer: 59 | Admitting: Infectious Diseases

## 2020-08-16 VITALS — BP 163/116 | HR 93 | Temp 98.4°F | Wt 299.0 lb

## 2020-08-16 DIAGNOSIS — J309 Allergic rhinitis, unspecified: Secondary | ICD-10-CM | POA: Diagnosis not present

## 2020-08-16 DIAGNOSIS — B20 Human immunodeficiency virus [HIV] disease: Secondary | ICD-10-CM

## 2020-08-16 DIAGNOSIS — Z79899 Other long term (current) drug therapy: Secondary | ICD-10-CM

## 2020-08-16 DIAGNOSIS — Z68.41 Body mass index (BMI) pediatric, greater than or equal to 95th percentile for age: Secondary | ICD-10-CM

## 2020-08-16 DIAGNOSIS — Z113 Encounter for screening for infections with a predominantly sexual mode of transmission: Secondary | ICD-10-CM

## 2020-08-16 DIAGNOSIS — I10 Essential (primary) hypertension: Secondary | ICD-10-CM | POA: Diagnosis not present

## 2020-08-16 MED ORDER — DOVATO 50-300 MG PO TABS: 1.0000 | ORAL_TABLET | Freq: Every day | ORAL | 3 refills | Status: DC

## 2020-08-16 MED ORDER — ENALAPRIL MALEATE 2.5 MG PO TABS: 2.5000 mg | ORAL_TABLET | Freq: Every day | ORAL | 3 refills | Status: DC

## 2020-08-16 NOTE — Assessment & Plan Note (Signed)
Will change her to dovato to get her away from needing to take with food.  Explained to her.  Offered/refused condoms.  vax are up to date.  rtc in 9 months

## 2020-08-16 NOTE — Assessment & Plan Note (Signed)
Working on decreasing wt.

## 2020-08-16 NOTE — Assessment & Plan Note (Signed)
Will stop her calcium channel blocker and start low dose ACE-I F/u in 2 weeks for BP check and BMP

## 2020-08-16 NOTE — Assessment & Plan Note (Signed)
Has been getting good relief with zyxal

## 2020-08-16 NOTE — Progress Notes (Signed)
   Subjective:    Patient ID: Anita Pacheco, female  DOB: 11-04-1970, 50 y.o.        MRN: [id number]   HPI 50yo F with HIV+, previously started on complera -->odefsy, HTN and anxiety.  Worried about keeping her job, her license. Has been at same job for 16 years. 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 She does not want to go back to school to complete training.   Had 2 deaths in family (uncle, nephew both end of 05-2020).   Has not been able to take her ART with food. Has been able to keep it down Has not been able to take her anti-htn rx. Amlodipine. Gives her headaches and dizziness.   Has son with cerebral palsy at home  HIV 1 RNA Quant  Date Value  02/14/2020 289 Copies/mL (H)  10/11/2019 316 copies/mL (H)  09/27/2019 466 copies/mL (H)   CD4 T Cell Abs (/uL)  Date Value  02/14/2020 493  09/27/2019 484  09/06/2019 786     Health Maintenance  Topic Date Due  . TETANUS/TDAP  Never done  . COLONOSCOPY (Pts 45-69yrs Insurance coverage will need to be confirmed)  Never done  . COVID-19 Vaccine (3 - Pfizer risk 4-dose series) 12/24/2019  . INFLUENZA VACCINE  01/29/2020  . MAMMOGRAM  10/18/2021  . PAP SMEAR-Modifier  10/14/2022  . Hepatitis C Screening  Completed  . HIV Screening  Completed    Review of Systems  Constitutional: Negative for chills, fever and weight loss.  HENT: Positive for congestion (COVID home test -).   Respiratory: Negative for cough and shortness of breath (using inhaler prn. ).   Gastrointestinal: Positive for nausea. Negative for diarrhea.  Genitourinary: Negative for dysuria.  Neurological: Positive for headaches.  Had a nebulizer but it died.  Please see HPI. All other systems reviewed and negative.     Objective:  Physical Exam Vitals reviewed.  Constitutional:      Appearance: Normal appearance. She is obese.  HENT:     Mouth/Throat:     Mouth: Mucous membranes are moist.     Pharynx: No oropharyngeal  exudate.  Eyes:     Extraocular Movements: Extraocular movements intact.     Pupils: Pupils are equal, round, and reactive to light.  Cardiovascular:     Rate and Rhythm: Normal rate and regular rhythm.  Pulmonary:     Effort: Pulmonary effort is normal.     Breath sounds: Normal breath sounds.  Abdominal:     General: Bowel sounds are normal. There is no distension.     Palpations: Abdomen is soft.     Tenderness: There is no abdominal tenderness.  Musculoskeletal:        General: Normal range of motion.     Cervical back: Normal range of motion and neck supple.     Right lower leg: No edema.     Left lower leg: No edema.  Neurological:     General: No focal deficit present.     Mental Status: She is alert.  Psychiatric:        Mood and Affect: Mood normal.            Assessment & Plan:

## 2020-08-17 ENCOUNTER — Telehealth: Payer: Self-pay

## 2020-08-17 ENCOUNTER — Ambulatory Visit: Payer: 59 | Admitting: Infectious Diseases

## 2020-08-17 NOTE — Telephone Encounter (Signed)
PA initiated via cover my meds.  KeyArnette Felts - PA Case ID: [id number] - Rx #: [id number]  Sent to plan: status pending. Valarie Cones

## 2020-08-22 NOTE — Telephone Encounter (Signed)
Received fax from [employer] for additional information on PA. Will fax last note to support medication.  Customer Service: [phone]  Authorization #: [id number] Fax: [phone]

## 2020-08-24 NOTE — Telephone Encounter (Signed)
[employer] approved medication, 08/23/20 - 08/22/21. Sent approval to pharmacy. Andree Coss, RN

## 2020-08-28 ENCOUNTER — Other Ambulatory Visit: Payer: Self-pay | Admitting: Infectious Diseases

## 2020-08-28 DIAGNOSIS — K219 Gastro-esophageal reflux disease without esophagitis: Secondary | ICD-10-CM

## 2020-09-13 ENCOUNTER — Telehealth (INDEPENDENT_AMBULATORY_CARE_PROVIDER_SITE_OTHER): Payer: 59 | Admitting: Infectious Diseases

## 2020-09-13 ENCOUNTER — Other Ambulatory Visit: Payer: Self-pay

## 2020-09-13 DIAGNOSIS — Z68.41 Body mass index (BMI) pediatric, greater than or equal to 95th percentile for age: Secondary | ICD-10-CM

## 2020-09-13 DIAGNOSIS — Z79899 Other long term (current) drug therapy: Secondary | ICD-10-CM

## 2020-09-13 DIAGNOSIS — I10 Essential (primary) hypertension: Secondary | ICD-10-CM | POA: Diagnosis not present

## 2020-09-13 DIAGNOSIS — B20 Human immunodeficiency virus [HIV] disease: Secondary | ICD-10-CM

## 2020-09-13 DIAGNOSIS — Z113 Encounter for screening for infections with a predominantly sexual mode of transmission: Secondary | ICD-10-CM

## 2020-09-13 NOTE — Assessment & Plan Note (Signed)
Has lost 10#.  Continues to work on wt loss.

## 2020-09-13 NOTE — Progress Notes (Signed)
   Subjective:    Patient ID: Anita Pacheco, female  DOB: 04-02-1971, 50 y.o.        MRN: [id number]   HPI 50yo F with HIV+, previously started on complera -->odefsy, HTN and anxiety.  Had 2 deaths in family (uncle, nephew both end of 05-2020).   Has son with cerebral palsy at home  She was seen 2-17 and her ART was changed to dovato due to issues with being able to keep rx down. She has been taking anti-emetic, still some quiesiness. Occas lightheadedness still, worse with quick movement.  She also was having difficulty with her anti-htn rx giving her side effects ( headaches and dizziness) .   HIV 1 RNA Quant  Date Value  02/14/2020 289 Copies/mL (H)  10/11/2019 316 copies/mL (H)  09/27/2019 466 copies/mL (H)   CD4 T Cell Abs (/uL)  Date Value  02/14/2020 493  09/27/2019 484  09/06/2019 786     Health Maintenance  Topic Date Due  . TETANUS/TDAP  Never done  . COLONOSCOPY (Pts 45-59yrs Insurance coverage will need to be confirmed)  Never done  . COVID-19 Vaccine (3 - Pfizer risk 4-dose series) 12/24/2019  . INFLUENZA VACCINE  01/29/2020  . MAMMOGRAM  10/18/2021  . PAP SMEAR-Modifier  10/14/2022  . Hepatitis C Screening  Completed  . HIV Screening  Completed  . HPV VACCINES  Aged Out      Review of Systems  Constitutional: Positive for malaise/fatigue.  Respiratory: Positive for cough (occs- late at night or firt thing in AM. ). Negative for shortness of breath.   Cardiovascular: Negative for leg swelling.  Gastrointestinal: Negative for constipation and diarrhea.  Genitourinary: Negative for dysuria.  Neurological: Positive for dizziness.  Please see HPI. All other systems reviewed and negative.     Objective:  Physical Exam 1. Due to the national emergency this service was provided using telemedicine. Phone.  2. Consent from the patient for the telehealth visit and that you identified patient- name, DOB  3. Your locations, Pt and Providerwork, [facility]   4. Chief complaint for visit- HIV f/u.   5. Document anyone else on the call- none  6. If the visit was a phone call, that you include the time you spent on the call. 12 minutes           Assessment & Plan:

## 2020-09-13 NOTE — Assessment & Plan Note (Signed)
Appears to be doing ok on her new rx.  She does not take her BP at work.  Has home BP monitor- she will call with results.

## 2020-09-13 NOTE — Assessment & Plan Note (Signed)
She appears to be doing better on new rx.  Will see her back in 2 months and check her labs then.  Her daughter is helping her with her son at home.

## 2020-09-25 ENCOUNTER — Other Ambulatory Visit: Payer: Self-pay | Admitting: Infectious Diseases

## 2020-09-25 DIAGNOSIS — J4520 Mild intermittent asthma, uncomplicated: Secondary | ICD-10-CM

## 2020-10-18 ENCOUNTER — Other Ambulatory Visit: Payer: 59

## 2020-10-19 ENCOUNTER — Other Ambulatory Visit: Payer: Self-pay

## 2020-10-19 ENCOUNTER — Other Ambulatory Visit: Payer: 59

## 2020-10-19 DIAGNOSIS — B20 Human immunodeficiency virus [HIV] disease: Secondary | ICD-10-CM

## 2020-10-19 DIAGNOSIS — Z79899 Other long term (current) drug therapy: Secondary | ICD-10-CM

## 2020-10-19 DIAGNOSIS — I10 Essential (primary) hypertension: Secondary | ICD-10-CM

## 2020-10-19 DIAGNOSIS — Z113 Encounter for screening for infections with a predominantly sexual mode of transmission: Secondary | ICD-10-CM

## 2020-10-22 LAB — LIPID PANEL
Cholesterol: 183 mg/dL (ref <200)
HDL: 47 mg/dL — ABNORMAL LOW (ref >=50)
LDL Cholesterol (Calc): 113 mg/dL (calc) — ABNORMAL HIGH
Non-HDL Cholesterol (Calc): 136 mg/dL (calc) — ABNORMAL HIGH (ref <130)
Total CHOL/HDL Ratio: 3.9 (calc) (ref <5.0)
Triglycerides: 119 mg/dL (ref <150)

## 2020-10-22 LAB — COMPREHENSIVE METABOLIC PANEL
AG Ratio: 0.9 (calc) — ABNORMAL LOW (ref 1.0–2.5)
ALT: 10 U/L (ref 6–29)
AST: 16 U/L (ref 10–35)
Albumin: 3.4 g/dL — ABNORMAL LOW (ref 3.6–5.1)
Alkaline phosphatase (APISO): 54 U/L (ref 37–153)
BUN: 11 mg/dL (ref 7–25)
CO2: 24 mmol/L (ref 20–32)
Calcium: 8.6 mg/dL (ref 8.6–10.4)
Chloride: 103 mmol/L (ref 98–110)
Creat: 0.88 mg/dL (ref 0.50–1.05)
Globulin: 3.7 g/dL (calc) (ref 1.9–3.7)
Glucose, Bld: 93 mg/dL (ref 65–99)
Potassium: 3.7 mmol/L (ref 3.5–5.3)
Sodium: 137 mmol/L (ref 135–146)
Total Bilirubin: 0.4 mg/dL (ref 0.2–1.2)
Total Protein: 7.1 g/dL (ref 6.1–8.1)

## 2020-10-22 LAB — CBC
HCT: 33.2 % — ABNORMAL LOW (ref 35.0–45.0)
Hemoglobin: 10.1 g/dL — ABNORMAL LOW (ref 11.7–15.5)
MCH: 23.6 pg — ABNORMAL LOW (ref 27.0–33.0)
MCHC: 30.4 g/dL — ABNORMAL LOW (ref 32.0–36.0)
MCV: 77.6 fL — ABNORMAL LOW (ref 80.0–100.0)
MPV: 10.2 fL (ref 7.5–12.5)
Platelets: 231 10*3/uL (ref 140–400)
RBC: 4.28 10*6/uL (ref 3.80–5.10)
RDW: 16.4 % — ABNORMAL HIGH (ref 11.0–15.0)
WBC: 4.4 10*3/uL (ref 3.8–10.8)

## 2020-10-22 LAB — T-HELPER CELLS (CD4) COUNT (NOT AT ARMC)
Absolute CD4: 522 cells/uL (ref 490–1740)
CD4 T Helper %: 27 % — ABNORMAL LOW (ref 30–61)
Total lymphocyte count: 1948 cells/uL (ref 850–3900)

## 2020-10-22 LAB — RPR: RPR Ser Ql: NONREACTIVE

## 2020-10-22 LAB — HIV-1 RNA QUANT-NO REFLEX-BLD
HIV 1 RNA Quant: 28 Copies/mL — ABNORMAL HIGH
HIV-1 RNA Quant, Log: 1.45 Log cps/mL — ABNORMAL HIGH

## 2020-11-09 ENCOUNTER — Encounter: Payer: 59 | Admitting: Infectious Diseases

## 2020-12-11 ENCOUNTER — Other Ambulatory Visit: Payer: Self-pay | Admitting: Infectious Diseases

## 2020-12-11 ENCOUNTER — Ambulatory Visit: Payer: 59 | Admitting: Infectious Diseases

## 2020-12-11 ENCOUNTER — Other Ambulatory Visit: Payer: Self-pay

## 2020-12-11 VITALS — BP 185/97 | HR 88 | Resp 16 | Ht 67.5 in | Wt 302.0 lb

## 2020-12-11 DIAGNOSIS — I10 Essential (primary) hypertension: Secondary | ICD-10-CM

## 2020-12-11 DIAGNOSIS — Z113 Encounter for screening for infections with a predominantly sexual mode of transmission: Secondary | ICD-10-CM

## 2020-12-11 DIAGNOSIS — B20 Human immunodeficiency virus [HIV] disease: Secondary | ICD-10-CM

## 2020-12-11 DIAGNOSIS — M79604 Pain in right leg: Secondary | ICD-10-CM | POA: Diagnosis not present

## 2020-12-11 DIAGNOSIS — Z79899 Other long term (current) drug therapy: Secondary | ICD-10-CM

## 2020-12-11 NOTE — Progress Notes (Signed)
   Subjective:    Patient ID: Anita Pacheco, female  DOB: Oct 28, 1970, 50 y.o.        MRN: [id number]   HPI 50 yo F with HIV+, previously started on complera --> odefsy --> dovato, HTN and anxiety. At last visit was started on low dose ACE-I to replace norvasc.  Has run out of her rx due to insurance issues. Off since last week. Last dovato last night.  Today c/o R leg pain. Spasms. Last for a couple of minutes. Was seen by ortho- deferred previous injections. "Whole leg...by the end of the day, I won't be able to walk.".  Her n/v has decreased since changing to dovato.  Her CD4 appears unchanged (percent same). Tries to avoid other otc/rx while on dovato. Avoiding NSAIDS.  Had a COVID scared after asthmatic daughter got COVID at work. She got (ab?) infusion.  Back to work which has been difficult.   HIV 1 RNA Quant  Date Value  10/19/2020 28 Copies/mL (H)  02/14/2020 289 Copies/mL (H)  10/11/2019 316 copies/mL (H)   CD4 T Cell Abs (/uL)  Date Value  02/14/2020 493  09/27/2019 484  09/06/2019 786     Health Maintenance  Topic Date Due  . Pneumococcal Vaccine 34-45 Years old (1 - PCV) Never done  . TETANUS/TDAP  Never done  . Zoster Vaccines- Shingrix (1 of 2) Never done  . COLONOSCOPY (Pts 45-33yrs Insurance coverage will need to be confirmed)  Never done  . COVID-19 Vaccine (3 - Pfizer risk series) 12/24/2019  . INFLUENZA VACCINE  01/28/2021  . MAMMOGRAM  10/18/2021  . PAP SMEAR-Modifier  10/14/2022  . Hepatitis C Screening  Completed  . HIV Screening  Completed  . HPV VACCINES  Aged Out      Review of Systems  Constitutional:  Positive for malaise/fatigue. Negative for chills, fever and weight loss.  Cardiovascular:  Negative for chest pain.  Gastrointestinal:  Negative for constipation and diarrhea.  Genitourinary:  Negative for dysuria.  Musculoskeletal:  Positive for myalgias.  Neurological:  Negative for headaches.   Please see HPI. All other systems  reviewed and negative.     Objective:  Physical Exam Constitutional:      Appearance: Normal appearance. She is obese.  HENT:     Mouth/Throat:     Mouth: Mucous membranes are moist.     Pharynx: No oropharyngeal exudate.  Eyes:     Extraocular Movements: Extraocular movements intact.     Pupils: Pupils are equal, round, and reactive to light.  Cardiovascular:     Rate and Rhythm: Normal rate and regular rhythm.  Pulmonary:     Effort: Pulmonary effort is normal.     Breath sounds: Normal breath sounds.  Abdominal:     General: Bowel sounds are normal. There is no distension.     Palpations: Abdomen is soft.     Tenderness: There is no abdominal tenderness.  Musculoskeletal:        General: Swelling present.     Cervical back: Normal range of motion and neck supple.     Right lower leg: Edema present.     Left lower leg: Edema present.  Neurological:     Mental Status: She is alert.          Assessment & Plan:

## 2020-12-11 NOTE — Assessment & Plan Note (Signed)
She will f/u with ortho She wants refill of lyrica Will send her for u/s for DVT

## 2020-12-11 NOTE — Assessment & Plan Note (Signed)
She's off her rx Will need her to f/u back on rx

## 2020-12-11 NOTE — Assessment & Plan Note (Addendum)
She is doing well Some concern about 2-3 day lapse in ART.  Space dovato and PNV 12h apart.  Offered/refused condoms.  Will refer for colon.  Will see her back in 6 months provided her u/s is negative.

## 2021-01-11 ENCOUNTER — Telehealth: Payer: Self-pay

## 2021-01-11 ENCOUNTER — Other Ambulatory Visit: Payer: Self-pay

## 2021-01-11 ENCOUNTER — Ambulatory Visit (HOSPITAL_COMMUNITY)
Admission: RE | Admit: 2021-01-11 | Discharge: 2021-01-11 | Disposition: A | Discharge status: Home / Self Care | Payer: 59 | Source: Ambulatory Visit | Attending: Infectious Diseases | Admitting: Infectious Diseases

## 2021-01-11 DIAGNOSIS — M79604 Pain in right leg: Secondary | ICD-10-CM | POA: Diagnosis present

## 2021-01-11 NOTE — Telephone Encounter (Signed)
Sent message to Rodney regarding Call report.

## 2021-01-11 NOTE — Telephone Encounter (Signed)
Preliminary report from Radiologist- Janann August.  Negative for DVT. Reflux in [place].   Patient was discharged and told that [facility] will contact her.

## 2021-01-25 ENCOUNTER — Encounter: Payer: Self-pay | Admitting: Gastroenterology

## 2021-01-25 ENCOUNTER — Ambulatory Visit (INDEPENDENT_AMBULATORY_CARE_PROVIDER_SITE_OTHER): Payer: 59 | Admitting: Gastroenterology

## 2021-01-25 VITALS — BP 156/110 | HR 92 | Ht 65.0 in | Wt 303.0 lb

## 2021-01-25 DIAGNOSIS — Z1211 Encounter for screening for malignant neoplasm of colon: Secondary | ICD-10-CM

## 2021-01-25 DIAGNOSIS — R112 Nausea with vomiting, unspecified: Secondary | ICD-10-CM | POA: Diagnosis not present

## 2021-01-25 DIAGNOSIS — D509 Iron deficiency anemia, unspecified: Secondary | ICD-10-CM

## 2021-01-25 MED ORDER — FERROUS SULFATE 300 (60 FE) MG/5ML PO SYRP: 300.0000 mg | ORAL_SOLUTION | Freq: Every day | ORAL | 3 refills | Status: AC

## 2021-01-25 MED ORDER — DOCUSATE SODIUM 100 MG PO CAPS: 100.0000 mg | ORAL_CAPSULE | Freq: Two times a day (BID) | ORAL | 3 refills | Status: AC

## 2021-01-25 NOTE — Progress Notes (Signed)
HPI : Anita Pacheco is a pleasant 50 year old female with a history of morbid obesity and HIV referred by Dr. Ninetta Lights MD for persistent nausea and for screening colonoscopy.  The patient states that she has been having problems with nausea and vomiting a year now.  Symptoms were intermittent for a while but became more severe few months ago.  The nausea would mostly be limited to the morning time, immediately upon awakening.  The nausea was so severe she would not be "functional" until she was able to vomit.  Vomiting would relieve the nausea but not make it completely go away usually.  During these episodes, she would often feel dizzy and lightheaded.  She denied having problems with abdominal pain during these episodes.  She also denies typical GERD symptoms such as heartburn or acid regurgitation.  She does not typically wake up from sleep because of nausea or vomiting. Her nausea did improve somewhat when she switched her HIV medication to [facility] and also when she started taking Prilosec daily. She reports regular bowel movements, usually with 3 stools per day.  Stools are usually soft and formed.  She denies straining or hard stools.  She also denies diarrhea.  She does sometimes report problems with feeling bloated or gassy She also has a long history of iron deficiency anemia.  The patient states she has never been told why she is anemic.  She does admit to heavy menstrual blood loss.  Her periods typically last 7 to 8 days, with heavy bleeding for 5 days on average.  She has taken iron supplementation on and off for many years, but typically stops taking it because of difficulty with constipation and GI upset. She has never had an upper or lower endoscopy.  Past Medical History:  Diagnosis Date   ANXIETY 07/22/2006   ASTHMA 07/22/2006   Breast mass 04/25/2011   Carpal tunnel syndrome 07/22/2006   HERPES ZOSTER 05/28/2010   HIV (human immunodeficiency virus infection) (HCC)    HYPERTENSION  07/22/2006   PERIPHERAL NEUROPATHY 07/03/2008   RLS (restless legs syndrome)      Past Surgical History:  Procedure Laterality Date   CESAREAN SECTION     ENDOVENOUS ABLATION SAPHENOUS VEIN W/ LASER Right 12/22/2017   endovenous laser ablation right greater saphenous vein by Josephina Gip MD   STAPEDES SURGERY Right    Family History  Problem Relation Age of Onset   CAD Mother        cardiomyopathy   GER disease Mother    Hypertension Mother    Pancreatic cancer Father        mets to colon  and liver   CAD Father    Throat cancer Paternal Grandmother    Asthma Daughter    Asthma Daughter    Lymphoma Maternal Aunt    Ovarian cancer Maternal Aunt    Kidney disease Son    Social History   Tobacco Use   Smoking status: Never   Smokeless tobacco: Never  Vaping Use   Vaping Use: Never used  Substance Use Topics   Alcohol use: Yes    Comment: holidays   Drug use: No   Current Outpatient Medications  Medication Sig Dispense Refill   albuterol (VENTOLIN HFA) 108 (90 Base) MCG/ACT inhaler INHALE 2 PUFFS INTO THE LUNGS EVERY 6 HOURS AS NEEDED FOR WHEEZING OR SHORTNESS OF BREATH 6.7 g 1   aspirin 325 MG tablet Take 325 mg by mouth every 4 (four) hours as needed. For arthritis  pain     diclofenac sodium (VOLTAREN) 1 % GEL Apply 1 application topically 4 (four) times daily. 100 g 6   docusate sodium (COLACE) 100 MG capsule Take 1 capsule (100 mg total) by mouth 2 (two) times daily. 180 capsule 3   Dolutegravir-lamiVUDine (DOVATO) 50-300 MG TABS Take 1 tablet by mouth daily. 90 tablet 3   enalapril (VASOTEC) 2.5 MG tablet Take 1 tablet (2.5 mg total) by mouth daily. 30 tablet 3   ferrous sulfate 300 (60 Fe) MG/5ML syrup Take 5 mLs (300 mg total) by mouth daily. 150 mL 3   fluticasone (FLONASE) 50 MCG/ACT nasal spray Place 1 spray into both nostrils daily as needed for allergies or rhinitis.     levocetirizine (XYZAL) 5 MG tablet TAKE ONE TABLET EVERY EVENING 31 tablet 0    lidocaine (XYLOCAINE) 5 % ointment Apply 1 application topically as needed. 50 g 0   naproxen sodium (ALEVE) 220 MG tablet Take 220 mg by mouth daily as needed. Taking 1.5 pills for pain     omeprazole (PRILOSEC) 40 MG capsule TAKE 1 CAPSULE(40 MG) BY MOUTH DAILY (Patient taking differently: Take 40 mg by mouth 2 (two) times daily as needed.) 30 capsule 3   pregabalin (LYRICA) 50 MG capsule Take 1 capsule (50 mg total) by mouth at bedtime as needed (pain). 90 capsule 1   Prenatal Vit-Fe Fumarate-FA (PRENATAL PLUS/IRON) 27-1 MG TABS Take 1 tablet by mouth daily. 180 each 0   sodium chloride (OCEAN) 0.65 % nasal spray Place 1 spray into the nose as needed for congestion. 15 mL 12   Fluticasone-Salmeterol 113-14 MCG/ACT AEPB Inhale 1 Dose into the lungs daily. 1 each 3   No current facility-administered medications for this visit.   Allergies  Allergen Reactions   Hydrocodone Hives and Itching    Excessive vomiting     Review of Systems: All systems reviewed and negative except where noted in HPI.     Physical Exam: BP (!) 156/110 (BP Location: Left Wrist, Patient Position: Sitting, Cuff Size: Normal)   Pulse 92   Ht 5\' 5"  (1.651 m) Comment: height measured without shoes  Wt (!) 303 lb (137.4 kg)   BMI 50.42 kg/m  Constitutional: Pleasant,well-developed, obese African-American female in no acute distress. HEENT: Normocephalic and atraumatic. Conjunctivae are normal. No scleral icterus. Cardiovascular: Normal rate, regular rhythm.  Pulmonary/chest: Effort normal and breath sounds normal. No wheezing, rales or rhonchi. Abdominal: Soft, nondistended, nontender. Bowel sounds active throughout. There are no masses palpable. No hepatomegaly. Extremities: no edema Neurological: Alert and oriented to person place and time. Skin: Skin is warm and dry. No rashes noted. Psychiatric: Normal mood and affect. Behavior is normal.  CBC    Component Value Date/Time   WBC 4.4 10/19/2020 1154    RBC 4.28 10/19/2020 1154   HGB 10.1 (L) 10/19/2020 1154   HCT 33.2 (L) 10/19/2020 1154   PLT 231 10/19/2020 1154   MCV 77.6 (L) 10/19/2020 1154   MCH 23.6 (L) 10/19/2020 1154   MCHC 30.4 (L) 10/19/2020 1154   RDW 16.4 (H) 10/19/2020 1154   LYMPHSABS 2,734 07/14/2017 1436   MONOABS 660 12/03/2016 1539   EOSABS 7 (L) 07/14/2017 1436   BASOSABS 28 07/14/2017 1436    CMP     Component Value Date/Time   NA 137 10/19/2020 1154   K 3.7 10/19/2020 1154   CL 103 10/19/2020 1154   CO2 24 10/19/2020 1154   GLUCOSE 93 10/19/2020 1154   BUN  11 10/19/2020 1154   CREATININE 0.88 10/19/2020 1154   CALCIUM 8.6 10/19/2020 1154   PROT 7.1 10/19/2020 1154   ALBUMIN 3.7 12/03/2016 1539   AST 16 10/19/2020 1154   ALT 10 10/19/2020 1154   ALKPHOS 58 12/03/2016 1539   BILITOT 0.4 10/19/2020 1154   GFRNONAA 91 02/14/2020 1435   GFRAA 105 02/14/2020 1435     ASSESSMENT AND PLAN: 50 year old female with 1+ year history of nausea and vomiting, usually confined to the mornings, without other symptoms such as abdominal pain or GERD symptoms.  We discussed the various etiologies of chronic nausea and vomiting to include GI causes, medications as well as psychological/stress/anxiety etiologies.  Given the absence of other chronic GI symptoms, I have low suspicion for an organic etiology such as peptic ulcer disease or gastroparesis as a cause of her symptoms.  However, an upper endoscopy is very reasonable to exclude luminal causes of her symptoms.  Similarly, her iron deficiency is most likely related to menstrual blood loss, but given her concomitant nausea vomiting, performing upper endoscopy and ruling out H. pylori and celiac disease is very reasonable.  We will also perform her initial average risk screening colonoscopy. For her iron deficiency, currently she is only taking a prenatal vitamin with small amounts of iron.  I think that her symptoms of fatigue and restless leg will improve if she is able to  normalize her iron stores.  I recommended she resume taking ferrous sulfate suspension, and we can give Colace to help with the constipation.  I introduced the idea of IV iron as a possible option if she does not tolerate the oral iron, but she seemed very hesitant about this.  Nausea/vomiting - Upper endoscopy  Iron deficiency anemia - Upper endoscopy - Ferrous sulfate suspension once daily - Colace twice daily for iron induced constipation  Colon cancer screening - Colonoscopy  The details, risks (including bleeding, perforation, infection, missed lesions, medication reactions and possible hospitalization or surgery if complications occur), benefits, and alternatives to EGD/colonoscopy with possible biopsy and possible polypectomy were discussed with the patient and she consents to proceed.   I spent a total of 45 minutes reviewing the patient's medical record, interviewing and examining the patient, discussing her diagnosis and management of her condition going forward, and documenting in the medical record   Cherie Lasalle E. Tomasa Rand, MD [facility]     Jackie Plum, MD

## 2021-01-25 NOTE — Patient Instructions (Addendum)
If you are age 51 or older, your body mass index should be between 23-30. Your Body mass index is 50.42 kg/m. If this is out of the aforementioned range listed, please consider follow up with your Primary Care Provider.  If you are age 65 or younger, your body mass index should be between 19-25. Your Body mass index is 50.42 kg/m. If this is out of the aformentioned range listed, please consider follow up with your Primary Care Provider.   We have sent the following medications to your pharmacy for you to pick up at your convenience:Ferrous sulfate syrup 300mg /68ml, colace 100 mg   Please return in 2 weeks for a weight check.   The [facility] providers would like to encourage you to use [facility] to communicate with providers for non-urgent requests or questions.  Due to long hold times on the telephone, sending your provider a message by [facility] may be a faster and more efficient way to get a response.  Please allow 48 business hours for a response.  Please remember that this is for non-urgent requests.   It was a pleasure to see you today!  Thank you for trusting me with your gastrointestinal care!    [facility] MD

## 2021-02-08 ENCOUNTER — Other Ambulatory Visit: Payer: Self-pay | Admitting: Pharmacist

## 2021-02-08 ENCOUNTER — Telehealth: Payer: Self-pay

## 2021-02-08 MED ORDER — NIRMATRELVIR/RITONAVIR (PAXLOVID)TABLET: 3.0000 | ORAL_TABLET | Freq: Two times a day (BID) | ORAL | 0 refills | Status: AC

## 2021-02-08 NOTE — Telephone Encounter (Signed)
With her BMI and comorbid conditions, she qualifies. Sent to [facility]. Please advise patient not to take Flonase while she is taking Paxlovid. Thanks, Aundra Millet!

## 2021-02-08 NOTE — Telephone Encounter (Addendum)
Spoke with patient, advised her that Paxlovid has been sent to the pharmacy and that she should not take Flonase while she's on Paxlovid. Patient verbalized understanding and has no further questions.    [name], RN

## 2021-02-08 NOTE — Telephone Encounter (Signed)
Patient called, says she tested positive for COVID today (8/12) due to a work exposure. She denies any symptoms, but is wondering what she should do. Advised her to isolate at home and continue with symptom management if they develop. Advised that if she develops shortness of breath she should go to the emergency room.   Patient wondering if she would be a good candidate for antivirals due to comorbid conditions. Will route to provider.   [name], RN

## 2021-03-05 ENCOUNTER — Ambulatory Visit (INDEPENDENT_AMBULATORY_CARE_PROVIDER_SITE_OTHER): Payer: 59 | Admitting: Infectious Diseases

## 2021-03-05 ENCOUNTER — Other Ambulatory Visit: Payer: Self-pay | Admitting: Infectious Diseases

## 2021-03-05 ENCOUNTER — Other Ambulatory Visit: Payer: Self-pay

## 2021-03-05 ENCOUNTER — Ambulatory Visit: Payer: 59

## 2021-03-05 ENCOUNTER — Other Ambulatory Visit (HOSPITAL_COMMUNITY): Payer: Self-pay

## 2021-03-05 ENCOUNTER — Encounter: Payer: Self-pay | Admitting: Infectious Diseases

## 2021-03-05 DIAGNOSIS — R053 Chronic cough: Secondary | ICD-10-CM

## 2021-03-05 DIAGNOSIS — I10 Essential (primary) hypertension: Secondary | ICD-10-CM

## 2021-03-05 DIAGNOSIS — U099 Post covid-19 condition, unspecified: Secondary | ICD-10-CM

## 2021-03-05 DIAGNOSIS — J4520 Mild intermittent asthma, uncomplicated: Secondary | ICD-10-CM | POA: Diagnosis not present

## 2021-03-05 DIAGNOSIS — B20 Human immunodeficiency virus [HIV] disease: Secondary | ICD-10-CM | POA: Diagnosis not present

## 2021-03-05 DIAGNOSIS — G609 Hereditary and idiopathic neuropathy, unspecified: Secondary | ICD-10-CM

## 2021-03-05 DIAGNOSIS — K219 Gastro-esophageal reflux disease without esophagitis: Secondary | ICD-10-CM

## 2021-03-05 MED ORDER — FLUTICASONE-SALMETEROL 113-14 MCG/ACT IN AEPB: 1.0000 | INHALATION_SPRAY | Freq: Every day | RESPIRATORY_TRACT | 3 refills | Status: AC

## 2021-03-05 MED ORDER — ENALAPRIL MALEATE 5 MG PO TABS: 5.0000 mg | ORAL_TABLET | Freq: Every day | ORAL | 3 refills | Status: DC

## 2021-03-05 MED ORDER — FLUTICASONE PROPIONATE 50 MCG/ACT NA SUSP: 1.0000 | Freq: Every day | NASAL | 3 refills | Status: AC | PRN

## 2021-03-05 NOTE — Assessment & Plan Note (Signed)
Encourage her to f/u for her endo when her cough is better.

## 2021-03-05 NOTE — Assessment & Plan Note (Signed)
HIV 1 RNA Quant  Date Value  10/19/2020 28 Copies/mL (H)  02/14/2020 289 Copies/mL (H)  10/11/2019 316 copies/mL (H)   CD4 T Cell Abs (/uL)  Date Value  02/14/2020 493  09/27/2019 484  09/06/2019 786   Has been adherent Need to check her HIV RNA But will focus today's visit on her COVID sx.  rtc in 2-3 weeks.

## 2021-03-05 NOTE — Progress Notes (Signed)
Subjective:    Patient ID: Anita Pacheco, female  DOB: 05-20-1971, 50 y.o.        MRN: [id number]   HPI 50 yo F with HIV+, previously started on complera --> odefsy --> dovato, HTN and anxiety. At her visit 11-2020, she complained of n/v. She was sent to GI who agreed to do upper/lower endoscopy.  She is here now with concerns about continued cough/sob since dx with COVID in Aug 2022.  Her cought wakes her up from sleep and also has caused episodes of urinary incont.  No sputum.  Has albuterol inh but does it makes her jittery.  Does not use more than bid. Has not used her nebulizer recently.  Zyrtec, claritin did not help. She is now trying zyzal.  Has tried nyquil, tylenol cough syrups without relief. Delsym does not help either.   No problems with dovato.  Denies missed ART or missed ACE-I.  Taking po iron.   HIV 1 RNA Quant  Date Value  10/19/2020 28 Copies/mL (H)  02/14/2020 289 Copies/mL (H)  10/11/2019 316 copies/mL (H)   CD4 T Cell Abs (/uL)  Date Value  02/14/2020 493  09/27/2019 484  09/06/2019 786     Health Maintenance  Topic Date Due  . TETANUS/TDAP  Never done  . Zoster Vaccines- Shingrix (1 of 2) Never done  . COLONOSCOPY (Pts 45-64yrs Insurance coverage will need to be confirmed)  Never done  . COVID-19 Vaccine (3 - Pfizer risk series) 12/24/2019  . INFLUENZA VACCINE  01/28/2021  . MAMMOGRAM  10/18/2021  . PAP SMEAR-Modifier  10/14/2022  . Pneumococcal Vaccine 73-83 Years old (4 - PPSV23 or PCV20) 05/19/2035  . Hepatitis C Screening  Completed  . HIV Screening  Completed  . HPV VACCINES  Aged Out      Review of Systems  Constitutional:  Negative for chills, fever and weight loss.  Respiratory:  Positive for cough, shortness of breath and wheezing. Negative for hemoptysis and sputum production.   Cardiovascular:  Negative for chest pain.  Gastrointestinal:  Positive for nausea. Negative for vomiting.  Genitourinary:  Positive for dysuria  (incontenince). Negative for urgency.  Neurological:  Positive for headaches (related prev to paxlovid).   Please see HPI. All other systems reviewed and negative.     Objective:  Physical Exam Vitals reviewed.  Constitutional:      Appearance: She is obese.  HENT:     Mouth/Throat:     Mouth: Mucous membranes are moist.  Eyes:     Extraocular Movements: Extraocular movements intact.     Pupils: Pupils are equal, round, and reactive to light.  Cardiovascular:     Rate and Rhythm: Normal rate and regular rhythm.  Pulmonary:     Effort: Pulmonary effort is normal.     Breath sounds: Normal breath sounds.  Abdominal:     General: Bowel sounds are normal. There is no distension.     Palpations: Abdomen is soft.     Tenderness: There is no abdominal tenderness.  Musculoskeletal:     Cervical back: Normal range of motion and neck supple.     Right lower leg: No edema.     Left lower leg: No edema.  Skin:    General: Skin is warm.  Neurological:     General: No focal deficit present.     Mental Status: She is alert.  Psychiatric:        Mood and Affect: Mood normal.  Assessment & Plan:

## 2021-03-05 NOTE — Assessment & Plan Note (Signed)
Will refill her flonase, symbicort.  Will see her back in 2-3 weeks.  If not better, consider pulm eval.

## 2021-03-05 NOTE — Assessment & Plan Note (Signed)
Her initial BP is quite high.  She has had headaches, her repeat BP check is better, though still not normal.  Will increase her ACE-I dose.

## 2021-03-06 ENCOUNTER — Other Ambulatory Visit: Payer: Self-pay | Admitting: Infectious Diseases

## 2021-03-06 ENCOUNTER — Telehealth: Payer: Self-pay

## 2021-03-06 NOTE — Telephone Encounter (Signed)
PA initiated on patient's Fluticasone. Awaiting response. Jeslie Lowe Loyola Mast, RN

## 2021-03-06 NOTE — Telephone Encounter (Signed)
Please advise on refill.

## 2021-03-07 ENCOUNTER — Other Ambulatory Visit: Payer: Self-pay | Admitting: Infectious Diseases

## 2021-03-07 DIAGNOSIS — J4520 Mild intermittent asthma, uncomplicated: Secondary | ICD-10-CM

## 2021-03-14 ENCOUNTER — Other Ambulatory Visit: Payer: Self-pay | Admitting: Infectious Diseases

## 2021-03-14 DIAGNOSIS — Z79899 Other long term (current) drug therapy: Secondary | ICD-10-CM

## 2021-03-14 DIAGNOSIS — B20 Human immunodeficiency virus [HIV] disease: Secondary | ICD-10-CM

## 2021-03-14 DIAGNOSIS — Z113 Encounter for screening for infections with a predominantly sexual mode of transmission: Secondary | ICD-10-CM

## 2021-03-27 NOTE — Telephone Encounter (Signed)
Per PA response patient does not require a PA for this medication. Anita Pacheco Pricilla Loveless

## 2021-03-28 ENCOUNTER — Ambulatory Visit (INDEPENDENT_AMBULATORY_CARE_PROVIDER_SITE_OTHER): Payer: 59 | Admitting: Infectious Diseases

## 2021-03-28 ENCOUNTER — Other Ambulatory Visit: Payer: Self-pay

## 2021-03-28 VITALS — BP 155/103 | HR 92 | Wt 297.0 lb

## 2021-03-28 DIAGNOSIS — Z113 Encounter for screening for infections with a predominantly sexual mode of transmission: Secondary | ICD-10-CM | POA: Diagnosis not present

## 2021-03-28 DIAGNOSIS — G609 Hereditary and idiopathic neuropathy, unspecified: Secondary | ICD-10-CM

## 2021-03-28 DIAGNOSIS — Z79899 Other long term (current) drug therapy: Secondary | ICD-10-CM

## 2021-03-28 DIAGNOSIS — R053 Chronic cough: Secondary | ICD-10-CM | POA: Diagnosis not present

## 2021-03-28 DIAGNOSIS — Z23 Encounter for immunization: Secondary | ICD-10-CM

## 2021-03-28 DIAGNOSIS — M79604 Pain in right leg: Secondary | ICD-10-CM

## 2021-03-28 DIAGNOSIS — I10 Essential (primary) hypertension: Secondary | ICD-10-CM

## 2021-03-28 DIAGNOSIS — B20 Human immunodeficiency virus [HIV] disease: Secondary | ICD-10-CM

## 2021-03-28 DIAGNOSIS — U099 Post covid-19 condition, unspecified: Secondary | ICD-10-CM

## 2021-03-28 MED ORDER — PREGABALIN 50 MG PO CAPS: 50.0000 mg | ORAL_CAPSULE | Freq: Every day | ORAL | 3 refills | Status: DC | PRN

## 2021-03-28 NOTE — Assessment & Plan Note (Signed)
She has improved significantly.

## 2021-03-28 NOTE — Assessment & Plan Note (Signed)
Will refill her lyrica.  Will continue to watch. Consider neuro re-eval.

## 2021-03-28 NOTE — Progress Notes (Signed)
   Subjective:    Patient ID: Anita Pacheco, female  DOB: [birth year], 50 y.o.        MRN: [id number]   HPI 50 yo F with HIV+, previously started on complera --> odefsy --> dovato, HTN and anxiety. At her visit 11-2020, she complained of n/v. She was sent to GI who agreed to do upper/lower endoscopy.  She is here now with concerns about continued cough/sob since dx with COVID in Aug 2022.    No problems with dovato.  Denies missed ART or missed ACE-I.  Taking po iron.   Was sseen 2 weeks ago with elevated BP, problems with asthma.  Still coughing today. Was prev worried it was related to an allergy to her friends dog, but has not chaned with removal of this insult.  Has been increasing her lyrica frequency and asks for higher dose. R arm pain/crawling, R leg "something's running up". Original issue was from "nerve damage" when seen by consultant.  Does help her get relief and get sleep at night.   Flu shot today.    HIV 1 RNA Quant  Date Value  10/19/2020 28 Copies/mL (H)  02/14/2020 289 Copies/mL (H)  10/11/2019 316 copies/mL (H)   CD4 T Cell Abs (/uL)  Date Value  02/14/2020 493  09/27/2019 484  09/06/2019 786     Health Maintenance  Topic Date Due   TETANUS/TDAP  Never done   Zoster Vaccines- Shingrix (1 of 2) Never done   COLONOSCOPY (Pts 45-3yrs Insurance coverage will need to be confirmed)  Never done   COVID-19 Vaccine (3 - Pfizer risk series) 12/24/2019   INFLUENZA VACCINE  01/28/2021   MAMMOGRAM  10/18/2021   PAP SMEAR-Modifier  10/14/2022   Hepatitis C Screening  Completed   HIV Screening  Completed   HPV VACCINES  Aged Out      Review of Systems  Constitutional:  Negative for chills, fever and weight loss.  Respiratory:  Positive for cough.   Gastrointestinal:  Negative for constipation and diarrhea.  Genitourinary:  Negative for dysuria.  Musculoskeletal:  Positive for myalgias.  Neurological:  Negative for headaches.   Please see HPI. All  other systems reviewed and negative.     Objective:  Physical Exam Constitutional:      Appearance: Normal appearance. She is obese.  HENT:     Mouth/Throat:     Mouth: Mucous membranes are moist.     Pharynx: No oropharyngeal exudate.  Eyes:     Extraocular Movements: Extraocular movements intact.     Pupils: Pupils are equal, round, and reactive to light.  Cardiovascular:     Rate and Rhythm: Normal rate and regular rhythm.  Pulmonary:     Effort: Pulmonary effort is normal.     Breath sounds: Normal breath sounds.  Abdominal:     General: Bowel sounds are normal. There is no distension.     Palpations: Abdomen is soft.     Tenderness: There is no abdominal tenderness.  Musculoskeletal:        General: Normal range of motion.     Cervical back: Normal range of motion and neck supple.     Right lower leg: No edema.     Left lower leg: No edema.  Neurological:     General: No focal deficit present.     Mental Status: She is alert.           Assessment & Plan:

## 2021-03-28 NOTE — Assessment & Plan Note (Signed)
She will get flu vax today She will f/u with Dr Leone Payor for GI eval for colon.  Offered/refused condoms.  PAP and mammo next year.  rtc in 9 months with labs prior.

## 2021-03-28 NOTE — Assessment & Plan Note (Signed)
Remains elevated but asx.  Will ask her PCP to f/u.

## 2021-04-18 ENCOUNTER — Ambulatory Visit: Payer: 59

## 2021-04-23 ENCOUNTER — Ambulatory Visit: Payer: 59

## 2021-04-23 ENCOUNTER — Other Ambulatory Visit: Payer: Self-pay

## 2021-04-23 ENCOUNTER — Ambulatory Visit (INDEPENDENT_AMBULATORY_CARE_PROVIDER_SITE_OTHER): Payer: 59

## 2021-04-23 DIAGNOSIS — Z23 Encounter for immunization: Secondary | ICD-10-CM | POA: Diagnosis not present

## 2021-04-23 NOTE — Progress Notes (Signed)
   Covid-19 Vaccination Clinic  Name:  Anabelen Kaminsky    MRN: [id number] DOB: 08-30-1970  04/23/2021  Ms. Waldeck was observed post Covid-19 immunization for 15 minutes without incident. She was provided with Vaccine Information Sheet and instruction to access the V-Safe system.   Ms. Klingerman was instructed to call 911 with any severe reactions post vaccine: Difficulty breathing  Swelling of face and throat  A fast heartbeat  A bad rash all over body  Dizziness and weakness   Immunizations Administered     Name Date Dose VIS Date Route   Pfizer Covid-19 Vaccine Bivalent Booster 04/23/2021 11:43 AM 0.3 mL 02/27/2021 Intramuscular   Manufacturer: [employer], [employer]   Lot: [id number]   NDC: [address]-6     [name], RN

## 2021-05-30 ENCOUNTER — Encounter: Payer: Self-pay | Admitting: Infectious Diseases

## 2021-06-11 ENCOUNTER — Ambulatory Visit: Payer: 59 | Admitting: Infectious Diseases

## 2021-06-13 ENCOUNTER — Ambulatory Visit: Payer: 59 | Admitting: Infectious Diseases

## 2021-07-03 ENCOUNTER — Telehealth: Payer: Self-pay

## 2021-07-03 NOTE — Telephone Encounter (Signed)
Patient called complaining of fever, chills, wheezing and dry cough x 2 days. Patient also reported some chest discomfort when she uses her albuterol inhaler. Patient reports she did an at home COVID test that was negative. Patient taking OTC tylenol cold. I have advised the patient she will need to be evaluated by pcp, ED or UC. Patient verbalized understanding.

## 2021-07-05 ENCOUNTER — Ambulatory Visit: Payer: 59 | Admitting: Infectious Diseases

## 2021-08-02 ENCOUNTER — Ambulatory Visit: Payer: Self-pay | Admitting: Infectious Diseases

## 2021-08-23 ENCOUNTER — Encounter: Payer: Self-pay | Admitting: Infectious Diseases

## 2021-08-23 ENCOUNTER — Other Ambulatory Visit: Payer: Self-pay

## 2021-08-23 ENCOUNTER — Ambulatory Visit (INDEPENDENT_AMBULATORY_CARE_PROVIDER_SITE_OTHER): Payer: 59 | Admitting: Infectious Diseases

## 2021-08-23 VITALS — BP 152/88 | HR 84 | Temp 98.2°F | Wt 286.0 lb

## 2021-08-23 DIAGNOSIS — J4520 Mild intermittent asthma, uncomplicated: Secondary | ICD-10-CM

## 2021-08-23 DIAGNOSIS — I10 Essential (primary) hypertension: Secondary | ICD-10-CM | POA: Diagnosis not present

## 2021-08-23 DIAGNOSIS — Z23 Encounter for immunization: Secondary | ICD-10-CM | POA: Diagnosis not present

## 2021-08-23 DIAGNOSIS — Z79899 Other long term (current) drug therapy: Secondary | ICD-10-CM

## 2021-08-23 DIAGNOSIS — M79604 Pain in right leg: Secondary | ICD-10-CM | POA: Diagnosis not present

## 2021-08-23 DIAGNOSIS — Z113 Encounter for screening for infections with a predominantly sexual mode of transmission: Secondary | ICD-10-CM

## 2021-08-23 DIAGNOSIS — N63 Unspecified lump in unspecified breast: Secondary | ICD-10-CM

## 2021-08-23 DIAGNOSIS — B20 Human immunodeficiency virus [HIV] disease: Secondary | ICD-10-CM

## 2021-08-23 NOTE — Assessment & Plan Note (Signed)
Send for [employer]

## 2021-08-23 NOTE — Assessment & Plan Note (Signed)
Appreciate PCP mgmt of poly-pharmacy for this.

## 2021-08-23 NOTE — Addendum Note (Signed)
Addended by: Adelfa Koh on: 08/23/2021 11:58 AM   Modules accepted: Orders

## 2021-08-23 NOTE — Progress Notes (Signed)
° °  Subjective:    Patient ID: Anita Pacheco, female  DOB: 10-27-1970, 51 y.o.        MRN: [id number]   HPI 51 yo F with HIV+, previously started on complera --> odefsy --> dovato, HTN and anxiety. She is having more knee pain. Wants handicap permit.Prev f/u with Dr Sharol Given.    No problems with dovato.  Denies missed ART or missed anti-htn rx. She does check her BP at home.  Recent death of her niece, sister now here with her. Pt out of work.    Asthma- has new inhaler from PCP. Lower dose of albuterol due to palpitations. Has self reported cough around pets, being outside, talking alot.   States her R leg is "cold all the time"  HIV 1 RNA Quant  Date Value  10/19/2020 28 Copies/mL (H)  02/14/2020 289 Copies/mL (H)  10/11/2019 316 copies/mL (H)   CD4 T Cell Abs (/uL)  Date Value  02/14/2020 493  09/27/2019 484  09/06/2019 786     Health Maintenance  Topic Date Due   TETANUS/TDAP  Never done   Zoster Vaccines- Shingrix (1 of 2) Never done   COLONOSCOPY (Pts 45-64yrs Insurance coverage will need to be confirmed)  Never done   COVID-19 Vaccine (4 - Booster for Pfizer series) 06/18/2021   MAMMOGRAM  10/18/2021   PAP SMEAR-Modifier  10/14/2022   INFLUENZA VACCINE  Completed   Hepatitis C Screening  Completed   HIV Screening  Completed   HPV VACCINES  Aged Out      Review of Systems  Constitutional:  Negative for chills, fever and weight loss.  Respiratory:  Positive for cough and shortness of breath.   Cardiovascular:  Negative for chest pain.  Gastrointestinal:  Negative for constipation and diarrhea.  Genitourinary:  Negative for dysuria.  Neurological:  Negative for headaches.   Please see HPI. All other systems reviewed and negative.     Objective:  Physical Exam Vitals reviewed.  Constitutional:      Appearance: Normal appearance. She is obese.  HENT:     Mouth/Throat:     Mouth: Mucous membranes are moist.     Pharynx: No oropharyngeal  exudate.  Eyes:     Extraocular Movements: Extraocular movements intact.     Pupils: Pupils are equal, round, and reactive to light.  Cardiovascular:     Rate and Rhythm: Normal rate and regular rhythm.  Pulmonary:     Effort: Pulmonary effort is normal.     Breath sounds: Normal breath sounds.  Abdominal:     General: Bowel sounds are normal. There is no distension.     Palpations: Abdomen is soft.     Tenderness: There is no abdominal tenderness.  Musculoskeletal:        General: Normal range of motion.     Cervical back: Normal range of motion and neck supple.     Right lower leg: No edema.     Left lower leg: No edema.     Comments: No effusions in knees.  Tenderness in tibial plateau on L.   Neurological:     General: No focal deficit present.     Mental Status: She is alert.          Assessment & Plan:

## 2021-08-23 NOTE — Assessment & Plan Note (Signed)
Appreciate PCP mgmt.Marland Kitchen  Despite her concerns, she does not cough or wheeze in clinic.

## 2021-08-23 NOTE — Assessment & Plan Note (Signed)
She will get labs today Will send for colon Will send for mammo rx for shingles vax Tdap today Dental referral placed today for [facility]. Information to schedule appointment completed today.  rtc in 3-4 months

## 2021-08-23 NOTE — Assessment & Plan Note (Signed)
Will have her seen by ortho hadnicap permit written.

## 2021-08-26 ENCOUNTER — Other Ambulatory Visit (HOSPITAL_COMMUNITY): Payer: Self-pay

## 2021-08-26 ENCOUNTER — Ambulatory Visit (INDEPENDENT_AMBULATORY_CARE_PROVIDER_SITE_OTHER): Payer: 59 | Admitting: Orthopedic Surgery

## 2021-08-26 DIAGNOSIS — M1711 Unilateral primary osteoarthritis, right knee: Secondary | ICD-10-CM

## 2021-08-26 MED ORDER — ZOSTER VAC RECOMB ADJUVANTED 50 MCG/0.5ML IM SUSR
0.5000 mL | INTRAMUSCULAR | 1 refills | Status: DC
  Filled 2021-08-26: qty 0.5, 1d supply, fill #0
  Filled 2021-10-31 – 2021-11-18 (×2): qty 0.5, 1d supply, fill #1

## 2021-08-27 ENCOUNTER — Encounter: Payer: Self-pay | Admitting: Orthopedic Surgery

## 2021-08-27 DIAGNOSIS — M1711 Unilateral primary osteoarthritis, right knee: Secondary | ICD-10-CM

## 2021-08-27 MED ORDER — LIDOCAINE HCL (PF) 1 % IJ SOLN
5.0000 mL | INTRAMUSCULAR | Status: AC | PRN
  Administered 2021-08-27: 5 mL

## 2021-08-27 MED ORDER — METHYLPREDNISOLONE ACETATE 40 MG/ML IJ SUSP
40.0000 mg | INTRAMUSCULAR | Status: AC | PRN
  Administered 2021-08-27: 40 mg via INTRA_ARTICULAR
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
## 2021-08-28 LAB — HIV-1 RNA ULTRAQUANT REFLEX TO GENTYP+
HIV 1 RNA Quant: <20 copies/mL — AB
HIV-1 RNA Quant, Log: <1.3 Log copies/mL — AB

## 2021-08-28 LAB — T-HELPER CELLS (CD4) COUNT (NOT AT ARMC)
Absolute CD4: 647 cells/uL (ref 490–1740)
CD4 T Helper %: 33 % (ref 30–61)
Total lymphocyte count: 1942 cells/uL (ref 850–3900)

## 2021-08-28 LAB — COMPREHENSIVE METABOLIC PANEL
AG Ratio: 1.1 (calc) (ref 1.0–2.5)
ALT: 7 U/L (ref 6–29)
AST: 11 U/L (ref 10–35)
Albumin: 3.8 g/dL (ref 3.6–5.1)
Alkaline phosphatase (APISO): 58 U/L (ref 37–153)
BUN: 15 mg/dL (ref 7–25)
CO2: 26 mmol/L (ref 20–32)
Calcium: 9.3 mg/dL (ref 8.6–10.4)
Chloride: 104 mmol/L (ref 98–110)
Creat: 0.86 mg/dL (ref 0.50–1.03)
Globulin: 3.5 g/dL (calc) (ref 1.9–3.7)
Glucose, Bld: 90 mg/dL (ref 65–99)
Potassium: 3.8 mmol/L (ref 3.5–5.3)
Sodium: 137 mmol/L (ref 135–146)
Total Bilirubin: 0.5 mg/dL (ref 0.2–1.2)
Total Protein: 7.3 g/dL (ref 6.1–8.1)

## 2021-08-28 LAB — C. TRACHOMATIS/N. GONORRHOEAE RNA
C. trachomatis RNA, TMA: NOT DETECTED
N. gonorrhoeae RNA, TMA: NOT DETECTED

## 2021-08-28 LAB — CBC
HCT: 34.6 % — ABNORMAL LOW (ref 35.0–45.0)
Hemoglobin: 10.7 g/dL — ABNORMAL LOW (ref 11.7–15.5)
MCH: 25.1 pg — ABNORMAL LOW (ref 27.0–33.0)
MCHC: 30.9 g/dL — ABNORMAL LOW (ref 32.0–36.0)
MCV: 81.2 fL (ref 80.0–100.0)
MPV: 10.7 fL (ref 7.5–12.5)
Platelets: 295 10*3/uL (ref 140–400)
RBC: 4.26 10*6/uL (ref 3.80–5.10)
RDW: 16.6 % — ABNORMAL HIGH (ref 11.0–15.0)
WBC: 5.5 10*3/uL (ref 3.8–10.8)

## 2021-08-28 LAB — LIPID PANEL
Cholesterol: 197 mg/dL (ref <200)
HDL: 40 mg/dL — ABNORMAL LOW (ref >=50)
LDL Cholesterol (Calc): 137 mg/dL (calc) — ABNORMAL HIGH
Non-HDL Cholesterol (Calc): 157 mg/dL (calc) — ABNORMAL HIGH (ref <130)
Total CHOL/HDL Ratio: 4.9 (calc) (ref <5.0)
Triglycerides: 95 mg/dL (ref <150)

## 2021-08-28 LAB — RPR: RPR Ser Ql: NONREACTIVE

## 2021-08-29 ENCOUNTER — Other Ambulatory Visit (HOSPITAL_COMMUNITY): Payer: Self-pay

## 2021-10-11 ENCOUNTER — Other Ambulatory Visit: Payer: Self-pay | Admitting: Infectious Diseases

## 2021-10-11 DIAGNOSIS — Z1231 Encounter for screening mammogram for malignant neoplasm of breast: Secondary | ICD-10-CM

## 2021-10-25 ENCOUNTER — Other Ambulatory Visit: Payer: Self-pay

## 2021-10-25 DIAGNOSIS — B20 Human immunodeficiency virus [HIV] disease: Secondary | ICD-10-CM

## 2021-10-25 MED ORDER — DOVATO 50-300 MG PO TABS: 1.0000 | ORAL_TABLET | Freq: Every day | ORAL | 1 refills | Status: DC

## 2021-10-31 ENCOUNTER — Other Ambulatory Visit (HOSPITAL_COMMUNITY): Payer: Self-pay

## 2021-10-31 ENCOUNTER — Other Ambulatory Visit: Payer: Self-pay | Admitting: Infectious Diseases

## 2021-10-31 ENCOUNTER — Other Ambulatory Visit: Payer: Self-pay | Admitting: Pharmacist

## 2021-10-31 DIAGNOSIS — I10 Essential (primary) hypertension: Secondary | ICD-10-CM

## 2021-10-31 DIAGNOSIS — B20 Human immunodeficiency virus [HIV] disease: Secondary | ICD-10-CM

## 2021-10-31 MED ORDER — DOVATO 50-300 MG PO TABS: 1.0000 | ORAL_TABLET | Freq: Every day | ORAL | 0 refills | Status: AC

## 2021-10-31 NOTE — Progress Notes (Signed)
Medication Samples have been provided to the patient.  Drug name: Dovato        Strength: 50/300 mg         Qty: 14 tablets (1 bottle)   LOT: [id number]   Exp.Date: 03/2023  Dosing instructions: Take one tablet by mouth once daily  The patient has been instructed regarding the correct time, dose, and frequency of taking this medication, including desired effects and most common side effects.   Kayce Chismar L. Deajah Erkkila, PharmD, BCIDP, AAHIVP, CPP Clinical Pharmacist Practitioner Infectious Diseases Clinical Pharmacist [facility] 06/11/2020, 10:07 AM  

## 2021-11-08 ENCOUNTER — Ambulatory Visit: Payer: 59

## 2021-11-18 ENCOUNTER — Other Ambulatory Visit (HOSPITAL_COMMUNITY): Payer: Self-pay

## 2021-11-20 ENCOUNTER — Ambulatory Visit: Payer: 59

## 2021-11-22 ENCOUNTER — Ambulatory Visit: Payer: Self-pay | Admitting: Infectious Diseases

## 2021-11-28 ENCOUNTER — Other Ambulatory Visit (HOSPITAL_COMMUNITY): Payer: Self-pay

## 2021-11-28 ENCOUNTER — Ambulatory Visit (INDEPENDENT_AMBULATORY_CARE_PROVIDER_SITE_OTHER): Payer: 59 | Admitting: Infectious Diseases

## 2021-11-28 ENCOUNTER — Other Ambulatory Visit: Payer: Self-pay

## 2021-11-28 ENCOUNTER — Encounter: Payer: Self-pay | Admitting: Infectious Diseases

## 2021-11-28 VITALS — BP 137/88 | HR 92 | Temp 97.8°F | Wt 280.0 lb

## 2021-11-28 DIAGNOSIS — Z113 Encounter for screening for infections with a predominantly sexual mode of transmission: Secondary | ICD-10-CM | POA: Diagnosis not present

## 2021-11-28 DIAGNOSIS — B20 Human immunodeficiency virus [HIV] disease: Secondary | ICD-10-CM

## 2021-11-28 DIAGNOSIS — Z79899 Other long term (current) drug therapy: Secondary | ICD-10-CM

## 2021-11-28 DIAGNOSIS — D509 Iron deficiency anemia, unspecified: Secondary | ICD-10-CM

## 2021-11-28 DIAGNOSIS — Z68.41 Body mass index (BMI) pediatric, greater than or equal to 95th percentile for age: Secondary | ICD-10-CM

## 2021-11-28 DIAGNOSIS — I10 Essential (primary) hypertension: Secondary | ICD-10-CM

## 2021-11-28 NOTE — Assessment & Plan Note (Signed)
Has lost wt, encouraged pt.

## 2021-11-28 NOTE — Assessment & Plan Note (Signed)
Well controlled today. Continue HCTZ.  

## 2021-11-28 NOTE — Assessment & Plan Note (Signed)
MCV has improved.  H/h stable.  Continues on MVI/iron.

## 2021-11-28 NOTE — Progress Notes (Signed)
   Subjective:    Patient ID: Anita Pacheco, female  DOB: [birth year], 51 y.o.        MRN: [id number]   HPI 51 yo F with HIV+, previously started on complera --> odefsy --> dovato, obesity, HTN and anxiety. She is having more knee pain. Wants handicap permit.Prev f/u with Dr Lajoyce Corners.  She had f/u with Ortho 07-2021 and will have R TKR after wt loss.  She had injection in her leg at that time. Feels like her paresthesias are since worse, no change in pain.  Has lost 22# since last year. On smoothie diet.  Daughter is helping.  CD4 647 (07-2021).   No problems with dovato.  She continues to take oral iron. Her H/H is stable.  She has repeat appt with breast center.    HIV 1 RNA Quant  Date Value  08/23/2021 <20 DETECTED copies/mL (A)  10/19/2020 28 Copies/mL (H)  02/14/2020 289 Copies/mL (H)   CD4 T Cell Abs (/uL)  Date Value  02/14/2020 493  09/27/2019 484  09/06/2019 786     Health Maintenance  Topic Date Due   COLONOSCOPY (Pts 45-39yrs Insurance coverage will need to be confirmed)  Never done   COVID-19 Vaccine (4 - Booster for Pfizer series) 06/18/2021   MAMMOGRAM  10/18/2021   Zoster Vaccines- Shingrix (2 of 2) 10/24/2021   INFLUENZA VACCINE  01/28/2022   PAP SMEAR-Modifier  10/14/2022   TETANUS/TDAP  08/24/2031   Hepatitis C Screening  Completed   HIV Screening  Completed   HPV VACCINES  Aged Out      Review of Systems  Constitutional:  Negative for chills, fever and weight loss.  Respiratory:  Negative for cough and shortness of breath.   Gastrointestinal:  Negative for constipation and diarrhea.  Genitourinary:  Negative for dysuria.  Musculoskeletal:  Positive for joint pain.   Please see HPI. All other systems reviewed and negative.     Objective:  Physical Exam Vitals reviewed.  Constitutional:      Appearance: Normal appearance. She is obese.  HENT:     Mouth/Throat:     Mouth: Mucous membranes are moist.     Pharynx: No oropharyngeal exudate.   Eyes:     Extraocular Movements: Extraocular movements intact.     Pupils: Pupils are equal, round, and reactive to light.  Cardiovascular:     Rate and Rhythm: Normal rate and regular rhythm.  Pulmonary:     Effort: Pulmonary effort is normal.     Breath sounds: Normal breath sounds.  Abdominal:     General: Bowel sounds are normal. There is no distension.     Palpations: Abdomen is soft.     Tenderness: There is no abdominal tenderness.  Musculoskeletal:     Cervical back: Normal range of motion and neck supple.     Right lower leg: No edema.     Left lower leg: No edema.  Neurological:     General: No focal deficit present.     Mental Status: She is alert.  Psychiatric:        Mood and Affect: Mood normal.           Assessment & Plan:

## 2021-11-28 NOTE — Assessment & Plan Note (Signed)
She will get her colon done this summer (per pt) She is o/w doing well vax up to date Encouraged to f/u with Dental Offered/refused condoms.  rtc in 9 months

## 2021-12-01 ENCOUNTER — Encounter: Payer: Self-pay | Admitting: Infectious Diseases

## 2021-12-03 ENCOUNTER — Other Ambulatory Visit: Payer: Self-pay

## 2021-12-03 ENCOUNTER — Ambulatory Visit: Payer: 59

## 2021-12-10 ENCOUNTER — Other Ambulatory Visit: Payer: Self-pay

## 2021-12-10 ENCOUNTER — Ambulatory Visit: Payer: 59

## 2021-12-10 NOTE — Progress Notes (Unsigned)
Therapist met with client as scheduled and discussed their progress. Therapist used active listening skills to provide client with opportunity to disclose previous challenges and successes since last session. Specific problem-solving skills were processed with client, including breaking down problems, brainstorming, evaluating, and choosing options. At times implementing a plan and evaluating and reevaluating results. Therapist assessed for SI/HI during session and will follow-up with client during the next session.    Client met with therapist as scheduled and presented alert; orient X4. At the start of session, client affect appeared euthymic; affect appeared to be congruent with client report. Client appeared to make connections between consequences, challenges and alternative thoughts of mental health recovery during this session. Client expressed concerns/problems in the following areas of their life regarding their responsibilities of friends and family. Client expressed their needs in the development of their treatment goals and described different communities of which can serve as an added support such as extended family. Client talked about the burdens of having her sister and her nephew live with her and having to take on more household chores because of it.  Client progress toward therapeutic goal(s) remain minimal at this time. Client denied SI/HI.  

## 2021-12-10 NOTE — Progress Notes (Unsigned)
Therapist met with client as scheduled to continue rapport building. Therapist introduced and shared some information about herself and encouraged the same from client in order to build trust and openness within the counseling relationship. Therapist discussed and reviewed treatment plans with client based on the result from client's assessment. Therapist discussed client current mental health symptoms /diagnosis of DSM-5, as well as recommendations and target population for various eligibility services. Therapist assessed for SI/HI during session and will follow-up with client during the next session.  Client met with therapist as scheduled and presented alert; orient X4. At the start of session, client affect appeared depressed; affect appeared to be congruent with client report. Client appeared to make connections between consequences, challenges and alternative thoughts of mental health recovery during this session. Client expressed their needs in the development of their treatment goals and described different communities of which can serve as an added support. Client expressed concerns/problems in the following areas of their life regarding life changes and responsibilities. Client progress toward therapeutic goal(s) remain minimal at this time. Client denied SI/HI.

## 2021-12-12 ENCOUNTER — Ambulatory Visit: Payer: 59

## 2021-12-23 ENCOUNTER — Ambulatory Visit: Payer: 59 | Admitting: Obstetrics and Gynecology

## 2021-12-24 ENCOUNTER — Other Ambulatory Visit (HOSPITAL_COMMUNITY): Payer: Self-pay

## 2022-01-02 ENCOUNTER — Ambulatory Visit: Payer: 59

## 2022-01-06 ENCOUNTER — Telehealth: Payer: Self-pay

## 2022-01-06 NOTE — Telephone Encounter (Signed)
Patient was previously seeing Dr. Ninetta Lights and plans to continue care here at [facility]. Patient is not scheduled to follow up here with you until 07/2022. Patient is requesting a refill on Lyrica. Dr. Ninetta Lights last filled 02/2021 with 3 refills Please advise

## 2022-01-13 ENCOUNTER — Other Ambulatory Visit: Payer: Self-pay | Admitting: Infectious Diseases

## 2022-01-13 ENCOUNTER — Other Ambulatory Visit: Payer: Self-pay

## 2022-01-13 DIAGNOSIS — B20 Human immunodeficiency virus [HIV] disease: Secondary | ICD-10-CM

## 2022-01-13 DIAGNOSIS — G609 Hereditary and idiopathic neuropathy, unspecified: Secondary | ICD-10-CM

## 2022-01-13 MED ORDER — DOVATO 50-300 MG PO TABS: 1.0000 | ORAL_TABLET | Freq: Every day | ORAL | 4 refills | Status: DC

## 2022-01-13 MED ORDER — PREGABALIN 50 MG PO CAPS: 50.0000 mg | ORAL_CAPSULE | Freq: Every day | ORAL | 3 refills | Status: DC | PRN

## 2022-01-21 ENCOUNTER — Ambulatory Visit: Payer: 59

## 2022-01-21 ENCOUNTER — Other Ambulatory Visit: Payer: Self-pay | Admitting: Infectious Diseases

## 2022-01-21 ENCOUNTER — Telehealth: Payer: Self-pay | Admitting: Infectious Diseases

## 2022-01-21 ENCOUNTER — Other Ambulatory Visit: Payer: Self-pay

## 2022-01-21 DIAGNOSIS — G609 Hereditary and idiopathic neuropathy, unspecified: Secondary | ICD-10-CM

## 2022-01-21 MED ORDER — PREGABALIN 50 MG PO CAPS: 50.0000 mg | ORAL_CAPSULE | Freq: Every day | ORAL | 0 refills | Status: DC | PRN

## 2022-01-21 NOTE — Telephone Encounter (Signed)
Ok for a one time refill.

## 2022-01-21 NOTE — Telephone Encounter (Signed)
Pt requested a call back because she is having trouble receiving her medication after switching providers. She stated she was told she needs an appt before she can receive those meds. She would like clarity. [phone]

## 2022-01-21 NOTE — Telephone Encounter (Signed)
Called patient to notify her that refill has been called in, no answer.   [name], RN

## 2022-01-21 NOTE — Progress Notes (Unsigned)
Therapist met with client as scheduled and discussed their progress. Therapist used active listening skills to provide client with opportunity to disclose previous challenges and successes since last session. Specific problem-solving skills were processed with client, including breaking down problems, brainstorming, evaluating, and choosing options. At times implementing a plan and evaluating and reevaluating results. Therapist assessed for SI/HI during session and will follow-up with client during the next session.  Client met with therapist as scheduled and presented alert; orient X4. At the start of session, client affect appeared euthymic; affect appeared to be congruent with client report. Client appeared to make connections between consequences, challenges and alternative thoughts of mental health recovery during this session. Client expressed concerns/problems in the following areas of their life regarding her work/life balance as it continues to affect her health. Client and clinician processed a back up plan due to lack of finances. 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 Client progress toward therapeutic goal(s) remain minimal at this time. Client denied SI/HI.

## 2022-01-21 NOTE — Telephone Encounter (Signed)
Called patient, she is requesting a refill of her Lyrica, but is not scheduled to see Dr. Elinor Parkinson until February of next year.   She states she is willing to come in sooner if this will help her get a refill faster. Will route to provider.   [name], RN

## 2022-01-28 ENCOUNTER — Ambulatory Visit
Admission: RE | Admit: 2022-01-28 | Discharge: 2022-01-28 | Disposition: A | Discharge status: Home / Self Care | Payer: No Typology Code available for payment source | Source: Ambulatory Visit | Attending: Infectious Diseases | Admitting: Infectious Diseases

## 2022-01-28 DIAGNOSIS — Z1231 Encounter for screening mammogram for malignant neoplasm of breast: Secondary | ICD-10-CM

## 2022-02-04 ENCOUNTER — Ambulatory Visit: Payer: 59

## 2022-02-04 ENCOUNTER — Other Ambulatory Visit: Payer: Self-pay

## 2022-02-04 NOTE — Progress Notes (Unsigned)
Clinician attempted to contact client, however, received no response or call back. Call went straight to voicemail stating, that "voicemail box has not been set up yet."

## 2022-02-11 ENCOUNTER — Ambulatory Visit: Payer: 59

## 2022-02-11 ENCOUNTER — Other Ambulatory Visit: Payer: Self-pay

## 2022-02-11 NOTE — Progress Notes (Unsigned)
Clinician attempted to contact client, however, received no response or call back. Call went straight to voicemail  

## 2022-02-14 ENCOUNTER — Ambulatory Visit (INDEPENDENT_AMBULATORY_CARE_PROVIDER_SITE_OTHER): Payer: 59 | Admitting: Infectious Diseases

## 2022-02-14 ENCOUNTER — Other Ambulatory Visit: Payer: Self-pay

## 2022-02-14 ENCOUNTER — Encounter: Payer: Self-pay | Admitting: Infectious Diseases

## 2022-02-14 VITALS — BP 161/96 | HR 79 | Temp 97.8°F | Ht 67.0 in | Wt 282.0 lb

## 2022-02-14 DIAGNOSIS — Z124 Encounter for screening for malignant neoplasm of cervix: Secondary | ICD-10-CM

## 2022-02-14 NOTE — Progress Notes (Signed)
      Subjective :    Anita Pacheco is a 51 y.o. female here for pap smear.  Review of Systems: Current GYN complaints or concerns: changes in bowel habits r/t iron supplements. Frequency is decreased to 1x a day and feeling backed up.  Not sexually active. No vaginal symptoms.     Past Medical History:  Diagnosis Date   ANXIETY 07/22/2006   ASTHMA 07/22/2006   Breast mass 04/25/2011   Carpal tunnel syndrome 07/22/2006   HERPES ZOSTER 05/28/2010   HIV (human immunodeficiency virus infection) (HCC)    HYPERTENSION 07/22/2006   PERIPHERAL NEUROPATHY 07/03/2008   RLS (restless legs syndrome)     Gynecologic History & Care: No obstetric history on file.  Post-menopausal  Contraception: abstinence, post-menopausal  Last Pap: 01-2020. Results were: normal Last Mammogram: 01/28/2022. Results were: normal    Objective :   Physical Exam - chaperone present  Constitutional: Well developed, well nourished, no acute distress. She is alert and oriented x3.  Pelvic: External genitalia is normal in appearance. The vagina is normal in appearance. The cervix is bulbous and easily visualized. No CMT, normal expected cervical mucus present.  Psych: She has a normal mood and affect.      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   Hyperglycemia 12/10/2016   Insomnia 01/24/2015   S/P tympanostomy tube placement 10/19/2012   Abnormality of gait 10/05/2012   Iron deficiency anemia 06/08/2012   Epistaxis 06/08/2012   Right leg pain 07/29/2011   Breast mass 04/25/2011   HERPES ZOSTER 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 Cervical brushing collected for cytology and HPV.  Discussed recommended screening interval for women living with HIV disease meant to be lifelong and at an interval of Q1-3 years pending results.  She has had several consecutive normal studies - she would be a good candidate to screen q3y pending today's test results.        Relevant Orders   Cytology - PAP( [place])     Rexene Alberts, MSN, NP-C [facility] Office: [phone] Pager: [phone]  02/14/22 9:51 AM

## 2022-02-14 NOTE — Assessment & Plan Note (Signed)
Normal pelvic exam. Cervical brushing collected for cytology and HPV.  Discussed recommended screening interval for women living with HIV disease meant to be lifelong and at an interval of Q1-3 years pending results.  She has had several consecutive normal studies - she would be a good candidate to screen q3y pending today's test results.

## 2022-02-17 ENCOUNTER — Other Ambulatory Visit: Payer: Self-pay | Admitting: Infectious Diseases

## 2022-02-18 ENCOUNTER — Ambulatory Visit: Payer: 59

## 2022-02-19 LAB — CYTOLOGY - PAP
Comment: NEGATIVE
Diagnosis: NEGATIVE
Diagnosis: REACTIVE
High risk HPV: NEGATIVE

## 2022-02-24 ENCOUNTER — Ambulatory Visit: Payer: 59 | Admitting: Advanced Practice Midwife

## 2022-02-25 ENCOUNTER — Other Ambulatory Visit: Payer: Self-pay

## 2022-02-25 ENCOUNTER — Ambulatory Visit: Payer: 59

## 2022-03-04 ENCOUNTER — Other Ambulatory Visit: Payer: Self-pay

## 2022-03-04 ENCOUNTER — Ambulatory Visit: Payer: 59

## 2022-03-11 ENCOUNTER — Ambulatory Visit: Payer: Self-pay

## 2022-03-11 ENCOUNTER — Other Ambulatory Visit: Payer: Self-pay

## 2022-03-11 NOTE — Progress Notes (Unsigned)
Therapist met with client as scheduled and discussed their progress. Therapist used active listening skills to provide client with opportunity to disclose previous challenges and successes since last session. Specific problem-solving skills were processed with client, including breaking down problems, brainstorming, evaluating, and choosing options. At times implementing a plan and evaluating and reevaluating results. Therapist assessed for SI/HI during session and will follow-up with client during the next session.  Client met with therapist as scheduled and presented alert; orient X4. At the start of session, client affect appeared euthymic; affect appeared to be congruent with client report. Client appeared to make connections between consequences, challenges and alternative thoughts of mental health recovery during this session. Client expressed their needs in the development of their treatment goals and described different communities of which can serve as an added support. Client expressed concerns/problems in the following areas of their life regarding talked about her acceptance to receive help.  She reported that some of her family members who were in the position to help her stated that they will help her in which ever way they choose and not in the ways client wants. Client reported that her daughters called her a liar for not telling them how bad her health was. Client progress toward therapeutic goal(s) remain minimal at this time. Client denied SI/HI.

## 2022-03-11 NOTE — Progress Notes (Unsigned)
Therapist met with client as scheduled and discussed their progress. Therapist used active listening skills to provide client with opportunity to disclose previous challenges and successes since last session. Specific problem-solving skills were processed with client, including breaking down problems, brainstorming, evaluating, and choosing options. At times implementing a plan and evaluating and reevaluating results. Therapist assessed for SI/HI during session and will follow-up with client during the next session.   Client attended session with therapist as scheduled and presented alert; orient X4. On start of session, client affect appeared euthymic; affect was congruent with client report. Appearance was relaxed/casual, and attitude was appropriate. Client was receptive to rapport building by sharing information about him/herself, such as: expressing likes, dislikes, and strengths. Client continues to talk about her work status and her declining health. Client reports that her finances have also been affected due to the fact that she is getting older and cannot work like she used to. Client progress toward therapeutic goal(s) are minimal at this time. Clinician referred client to a med management provider. Client denied SI/HI.

## 2022-03-18 NOTE — Progress Notes (Unsigned)
Therapist met with client as scheduled and discussed their progress. Therapist used active listening skills to provide client with opportunity to disclose previous challenges and successes since last session. Specific problem-solving skills were processed with client, including breaking down problems, brainstorming, evaluating, and choosing options. At times implementing a plan and evaluating and reevaluating results. Therapist assessed for SI/HI during session and will follow-up with client during the next session.   Client attended session with therapist as scheduled and presented alert; orient X4. On start of session, client affect appeared euthymic; affect was congruent with client report. Appearance was relaxed/casual, and attitude was appropriate. Client was receptive to rapport building by sharing information about him/herself, such as: expressing likes, dislikes, and strengths. Client talked about her family dynamic and how exhausted she was from work. Client knows that she is getting older and understands that needing assistance may be a new part of her life. Client progress toward therapeutic goal(s) are minimal at this time. Clinician referred client to a med management provider. Client denied SI/HI.

## 2022-04-14 ENCOUNTER — Encounter: Payer: Self-pay | Admitting: Infectious Diseases

## 2022-06-10 ENCOUNTER — Other Ambulatory Visit: Payer: Self-pay | Admitting: *Deleted

## 2022-06-10 DIAGNOSIS — M79604 Pain in right leg: Secondary | ICD-10-CM

## 2022-06-11 ENCOUNTER — Ambulatory Visit: Payer: Commercial Managed Care - HMO | Admitting: Vascular Surgery

## 2022-06-11 ENCOUNTER — Encounter: Payer: Self-pay | Admitting: Vascular Surgery

## 2022-06-11 ENCOUNTER — Ambulatory Visit (HOSPITAL_COMMUNITY)
Admission: RE | Admit: 2022-06-11 | Discharge: 2022-06-11 | Disposition: A | Discharge status: Home / Self Care | Payer: Commercial Managed Care - HMO | Source: Ambulatory Visit | Attending: Vascular Surgery | Admitting: Vascular Surgery

## 2022-06-11 VITALS — BP 166/89 | HR 74 | Temp 98.0°F | Resp 20 | Ht 67.0 in | Wt 269.0 lb

## 2022-06-11 DIAGNOSIS — I8001 Phlebitis and thrombophlebitis of superficial vessels of right lower extremity: Secondary | ICD-10-CM | POA: Diagnosis not present

## 2022-06-11 DIAGNOSIS — M79604 Pain in right leg: Secondary | ICD-10-CM | POA: Diagnosis not present

## 2022-06-11 NOTE — Progress Notes (Signed)
Patient ID: Anita Pacheco, female   DOB: 1971-02-21, [age] y.Anita Shiverso.   MRN: [id number]  Reason for Consult: New Patient (Initial Visit)   Referred by Jackie Plumsei-Bonsu, George, MD  Subjective:     HPI:  Anita Pacheco is a 51 y.o. female has a history of right greater saphenous vein ablation several years ago here in our office.  She continues to wear compression stockings.  About 2 weeks ago she developed severe pain on the right medial leg with firmness and states that it was also warm.  She has been taking meloxicam as well as using heating pads and this has improved although still painful.  She is never had issues like this before.  She had associated swelling of the right leg.  Nothing similar in the left leg.  She does not take any blood thinners.  She denies any history of blood clots in the past.  Past Medical History:  Diagnosis Date   ANXIETY 07/22/2006   ASTHMA 07/22/2006   Breast mass 04/25/2011   Carpal tunnel syndrome 07/22/2006   HERPES ZOSTER 05/28/2010   HIV (human immunodeficiency virus infection) (HCC)    HYPERTENSION 07/22/2006   PERIPHERAL NEUROPATHY 07/03/2008   RLS (restless legs syndrome)    Family History  Problem Relation Age of Onset   CAD Mother        cardiomyopathy   GER disease Mother    Hypertension Mother    Pancreatic cancer Father        mets to colon  and liver   CAD Father    Throat cancer Paternal Grandmother    Asthma Daughter    Asthma Daughter    Lymphoma Maternal Aunt    Ovarian cancer Maternal Aunt    Kidney disease Son    Past Surgical History:  Procedure Laterality Date   CESAREAN SECTION     ENDOVENOUS ABLATION SAPHENOUS VEIN W/ LASER Right 12/22/2017   endovenous laser ablation right greater saphenous vein by Josephina GipJames Lawson MD   STAPEDES SURGERY Right     Short Social History:  Social History   Tobacco Use   Smoking status: Never   Smokeless tobacco: Never  Substance Use Topics   Alcohol use: Yes    Comment: holidays     Allergies  Allergen Reactions   Hydrocodone Hives and Itching    Excessive vomiting   Enalapril Rash    Current Outpatient Medications  Medication Sig Dispense Refill   albuterol (VENTOLIN HFA) 108 (90 Base) MCG/ACT inhaler INHALE 2 PUFFS INTO THE LUNGS EVERY 6 HOURS AS NEEDED FOR WHEEZING OR SHORTNESS OF BREATH 6.7 g 1   amLODipine (NORVASC) 10 MG tablet Take 10 mg by mouth daily.     diclofenac sodium (VOLTAREN) 1 % GEL Apply 1 application topically 4 (four) times daily. 100 g 6   docusate sodium (COLACE) 100 MG capsule Take 1 capsule (100 mg total) by mouth 2 (two) times daily. 180 capsule 3   dolutegravir-lamiVUDine (DOVATO) 50-300 MG tablet Take 1 tablet by mouth daily. 30 tablet 4   DULoxetine (CYMBALTA) 30 MG capsule 1 cap(s) orally 2 times a day for 30 days     ferrous sulfate 300 (60 Fe) MG/5ML syrup Take 5 mLs (300 mg total) by mouth daily. 150 mL 3   fluticasone (FLONASE) 50 MCG/ACT nasal spray Place 1 spray into both nostrils daily as needed for allergies or rhinitis. 16 g 3   hydrochlorothiazide (HYDRODIURIL) 25 MG tablet Take 25 mg by mouth daily.  levocetirizine (XYZAL) 5 MG tablet TAKE ONE TABLET EVERY EVENING 31 tablet 0   lidocaine (XYLOCAINE) 5 % ointment Apply 1 application topically as needed. 50 g 0   meloxicam (MOBIC) 15 MG tablet 1 tab(s) orally once a day for 30 day(s)     omeprazole (PRILOSEC) 40 MG capsule TAKE 1 CAPSULE(40 MG) BY MOUTH DAILY (Patient taking differently: Take 40 mg by mouth 2 (two) times daily as needed.) 30 capsule 3   pregabalin (LYRICA) 50 MG capsule Take 1 capsule (50 mg total) by mouth daily as needed. 90 capsule 0   Prenatal Vit-Fe Fumarate-FA (PRENATAL PLUS/IRON) 27-1 MG TABS Take 1 tablet by mouth daily. 180 each 0   sodium chloride (OCEAN) 0.65 % nasal spray Place 1 spray into the nose as needed for congestion. 15 mL 12   Fluticasone-Salmeterol 113-14 MCG/ACT AEPB Inhale 1 Dose into the lungs daily. 1 each 3   Zoster Vaccine  Adjuvanted [facility]) injection Inject 0.5 mLs into the muscle. (Patient not taking: Reported on 06/11/2022) 0.5 mL 1   No current facility-administered medications for this visit.    Review of Systems  Constitutional:  Constitutional negative. HENT: HENT negative.  Eyes: Eyes negative.  Cardiovascular: Positive for leg swelling.  GI: Gastrointestinal negative.  Musculoskeletal: Positive for leg pain.  Skin: Skin negative.  Neurological: Neurological negative. Hematologic: Hematologic/lymphatic negative.  Psychiatric: Psychiatric negative.        Objective:  Objective   Vitals:   06/11/22 0850  BP: (!) 166/89  Pulse: 74  Resp: 20  Temp: 98 F (36.7 C)  SpO2: 96%  Weight: 269 lb (122 kg)  Height: 5\' 7"  (1.702 m)   Body mass index is 42.13 kg/m.  Physical Exam Constitutional:      Appearance: She is obese.  HENT:     Head: Normocephalic.     Nose: Nose normal.  Eyes:     Pupils: Pupils are equal, round, and reactive to light.  Cardiovascular:     Rate and Rhythm: Normal rate.     Pulses: Normal pulses.  Pulmonary:     Effort: Pulmonary effort is normal.  Abdominal:     General: Abdomen is flat.  Musculoskeletal:     Cervical back: Normal range of motion.     Comments: There is an area on the right medial distal thigh with a palpable cord consistent with thrombosed varicosity  Neurological:     General: No focal deficit present.     Mental Status: She is alert.  Psychiatric:        Mood and Affect: Mood normal.     Data: Venous Reflux Times  +--------------+---------+------+-----------+------------+----------------+   RIGHT        Reflux NoRefluxReflux TimeDiameter cmsComments                                  Yes                                            +--------------+---------+------+-----------+------------+----------------+   CFV                    yes   >1 second                                 +--------------+---------+------+-----------+------------+----------------+  FV mid                  yes   >1 second                                +--------------+---------+------+-----------+------------+----------------+   Popliteal    no                                                       +--------------+---------+------+-----------+------------+----------------+   GSV at [facility]    no                            0.54                       +--------------+---------+------+-----------+------------+----------------+   GSV prox thighno                            0.35                       +--------------+---------+------+-----------+------------+----------------+   GSV mid thigh           yes    >500 ms      0.44                       +--------------+---------+------+-----------+------------+----------------+   GSV dist thigh          yes    >500 ms      0.96    chronic  thrombus  +--------------+---------+------+-----------+------------+----------------+   GSV at knee   no                            0.43    thrombosed vv      +--------------+---------+------+-----------+------------+----------------+   GSV prox calf no                            0.27                       +--------------+---------+------+-----------+------------+----------------+   GSV mid calf  no                            0.27                       +--------------+---------+------+-----------+------------+----------------+   SSV Pop Fossa no                            0.51                       +--------------+---------+------+-----------+------------+----------------+   SSV prox calf no                            0.29                       +--------------+---------+------+-----------+------------+----------------+   SSV mid calf  no                            0.37                        +--------------+---------+------+-----------+------------+----------------+   AASV o        no                            0.74                       +--------------+---------+------+-----------+------------+----------------+   AASV p                  yes    >500 ms      0.37                       +--------------+---------+------+-----------+------------+----------------+   AASV p-m                yes    >500 ms                                 +--------------+---------+------+-----------+------------+----------------+         Summary:  Right:  - No evidence of deep vein thrombosis seen in the right lower extremity,  from the common femoral through the popliteal veins.  - No evidence of superficial venous reflux seen in the right short  saphenous vein.  - Venous reflux is noted in the right common femoral vein.  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  - Venous reflux is noted in the right femoral vein.  - Venous reflux in the AASV proximal to mid segment  - 2.37 x 1.11 cm cystic structure medial knee area.  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  There is also question of cystic structure in that area but by palpation feels like a cord consistent with superficial thrombophlebitis.  Pain is improving.  I recommended taking aspirin daily as well as warm compresses and wearing compression stockings.  She can see [place] on an as-needed basis.     Maeola Harman MD [facility]

## 2022-06-18 ENCOUNTER — Other Ambulatory Visit: Payer: Self-pay | Admitting: Infectious Diseases

## 2022-06-18 DIAGNOSIS — G609 Hereditary and idiopathic neuropathy, unspecified: Secondary | ICD-10-CM

## 2022-07-14 ENCOUNTER — Other Ambulatory Visit: Payer: Self-pay | Admitting: Infectious Diseases

## 2022-07-14 DIAGNOSIS — B20 Human immunodeficiency virus [HIV] disease: Secondary | ICD-10-CM

## 2022-07-18 DIAGNOSIS — I10 Essential (primary) hypertension: Secondary | ICD-10-CM | POA: Diagnosis not present

## 2022-07-18 DIAGNOSIS — G894 Chronic pain syndrome: Secondary | ICD-10-CM | POA: Diagnosis not present

## 2022-07-18 DIAGNOSIS — I82401 Acute embolism and thrombosis of unspecified deep veins of right lower extremity: Secondary | ICD-10-CM | POA: Diagnosis not present

## 2022-07-18 DIAGNOSIS — J4530 Mild persistent asthma, uncomplicated: Secondary | ICD-10-CM | POA: Diagnosis not present

## 2022-08-25 ENCOUNTER — Other Ambulatory Visit (HOSPITAL_COMMUNITY)
Admission: RE | Admit: 2022-08-25 | Discharge: 2022-08-25 | Disposition: A | Discharge status: Home / Self Care | Payer: Self-pay | Source: Ambulatory Visit | Attending: Infectious Diseases | Admitting: Infectious Diseases

## 2022-08-25 ENCOUNTER — Other Ambulatory Visit: Payer: Self-pay

## 2022-08-25 ENCOUNTER — Ambulatory Visit (INDEPENDENT_AMBULATORY_CARE_PROVIDER_SITE_OTHER): Payer: Self-pay | Admitting: Infectious Diseases

## 2022-08-25 ENCOUNTER — Encounter: Payer: Self-pay | Admitting: Infectious Diseases

## 2022-08-25 VITALS — BP 137/90 | HR 85 | Temp 98.4°F | Wt 277.0 lb

## 2022-08-25 DIAGNOSIS — Z113 Encounter for screening for infections with a predominantly sexual mode of transmission: Secondary | ICD-10-CM | POA: Insufficient documentation

## 2022-08-25 DIAGNOSIS — Z7185 Encounter for immunization safety counseling: Secondary | ICD-10-CM

## 2022-08-25 DIAGNOSIS — B20 Human immunodeficiency virus [HIV] disease: Secondary | ICD-10-CM

## 2022-08-25 DIAGNOSIS — Z5181 Encounter for therapeutic drug level monitoring: Secondary | ICD-10-CM

## 2022-08-25 DIAGNOSIS — Z0000 Encounter for general adult medical examination without abnormal findings: Secondary | ICD-10-CM | POA: Insufficient documentation

## 2022-08-25 MED ORDER — DOVATO 50-300 MG PO TABS: 1.0000 | ORAL_TABLET | Freq: Every day | ORAL | 5 refills | Status: DC

## 2022-08-25 NOTE — Progress Notes (Addendum)
[place], [place], [place], [address]                                                                  Phn. [phone]; Fax: [phone]                                                                             Date: 08/25/22  Reason for Visit: HIV follow up   HPI: Anita Pacheco is a 52 y.o.old female with a history of HIV on Dovato ( prreviously on complera> odefsey), previously followed by Dr hatcher who is here for regular follow up.   Taking dovato, denies missing pills or barriers to adherence of tx Used to work as a Web designer.  Denies smoking, alcohol occasionally and denies IVDU Denies being sexually active Lives alone Reports following at Dental clinic.  She has recently started following with a PCP.  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  No complaints otherwise.   ROS: Denies fevers, chills. Denies nausea, vomiting, abdominal pain, diarrhea/constipation, loss of appetite and weight loss. Denies SOB, cough and chest pain. Denies GU complaints. Denies recent ED visits and recent hospitalizations. Denies rashes, joint complaints, headaches/neurological complaints                                     Current Outpatient Medications on File Prior to Visit  Medication Sig Dispense Refill   albuterol (VENTOLIN HFA) 108 (90 Base) MCG/ACT inhaler INHALE 2 PUFFS INTO THE LUNGS EVERY 6 HOURS AS NEEDED FOR WHEEZING OR SHORTNESS OF BREATH 6.7 g 1   amLODipine (NORVASC) 10 MG tablet Take 10 mg by mouth daily.     aspirin EC 81 MG tablet Take 81 mg by mouth daily. Swallow whole.     diclofenac sodium (VOLTAREN) 1 % GEL Apply 1 application topically 4 (four) times daily. 100 g 6   docusate sodium (COLACE) 100 MG capsule  Take 1 capsule (100 mg total) by mouth 2 (two) times daily. 180 capsule 3   DULoxetine (CYMBALTA) 30 MG capsule 1 cap(s) orally 2 times a day for 30 days     ferrous sulfate 300 (60 Fe) MG/5ML syrup Take 5 mLs (300 mg total) by mouth daily. 150 mL 3   fluticasone (FLONASE) 50 MCG/ACT nasal spray Place 1 spray into both nostrils daily as needed for allergies or rhinitis. 16 g 3   hydrochlorothiazide (HYDRODIURIL) 25 MG tablet Take 25 mg by mouth daily.     levocetirizine (  XYZAL) 5 MG tablet TAKE ONE TABLET EVERY EVENING 31 tablet 0   meloxicam (MOBIC) 15 MG tablet 1 tab(s) orally once a day for 30 day(s)     omeprazole (PRILOSEC) 40 MG capsule TAKE 1 CAPSULE(40 MG) BY MOUTH DAILY (Patient taking differently: Take 40 mg by mouth 2 (two) times daily as needed.) 30 capsule 3   pregabalin (LYRICA) 100 MG capsule Take 100 mg by mouth 2 (two) times daily.     Prenatal Vit-Fe Fumarate-FA (PRENATAL PLUS/IRON) 27-1 MG TABS Take 1 tablet by mouth daily. 180 each 0   Fluticasone-Salmeterol 113-14 MCG/ACT AEPB Inhale 1 Dose into the lungs daily. 1 each 3   sodium chloride (OCEAN) 0.65 % nasal spray Place 1 spray into the nose as needed for congestion. (Patient not taking: Reported on 08/25/2022) 15 mL 12   No current facility-administered medications on file prior to visit.   Allergies  Allergen Reactions   Hydrocodone Hives and Itching    Excessive vomiting   Enalapril Rash   Past Medical History:  Diagnosis Date   ANXIETY 07/22/2006   ASTHMA 07/22/2006   Breast mass 04/25/2011   Carpal tunnel syndrome 07/22/2006   HERPES ZOSTER 05/28/2010   HIV (human immunodeficiency virus infection) ([place])    HYPERTENSION 07/22/2006   PERIPHERAL NEUROPATHY 07/03/2008   RLS (restless legs syndrome)    Past Surgical History:  Procedure Laterality Date   CESAREAN SECTION     ENDOVENOUS ABLATION SAPHENOUS VEIN W/ LASER Right 12/22/2017   endovenous laser ablation right greater saphenous vein by Tinnie Gens MD    STAPEDES SURGERY Right    Social History   Socioeconomic History   Marital status: Single    Spouse name: Not on file   Number of children: 3   Years of education: Not on file   Highest education level: Not on file  Occupational History   Occupation: med tech  Tobacco Use   Smoking status: Never   Smokeless tobacco: Never  Vaping Use   Vaping Use: Never used  Substance and Sexual Activity   Alcohol use: Yes    Comment: holidays   Drug use: No   Sexual activity: Not Currently    Comment: pt. declined condoms  Other Topics Concern   Not on file  Social History Narrative   Not on file   Social Determinants of Health   Financial Resource Strain: Not on file  Food Insecurity: Not on file  Transportation Needs: Not on file  Physical Activity: Not on file  Stress: Not on file  Social Connections: Not on file  Intimate Partner Violence: Not on file   Family History  Problem Relation Age of Onset   CAD Mother        cardiomyopathy   GER disease Mother    Hypertension Mother    Pancreatic cancer Father        mets to colon  and liver   CAD Father    Throat cancer Paternal Grandmother    Asthma Daughter    Asthma Daughter    Lymphoma Maternal Aunt    Ovarian cancer Maternal Aunt    Kidney disease Son     Vitals BP (!) 137/90   Pulse 85   Temp 98.4 F (36.9 C) (Oral)   Wt 277 lb (125.6 kg)   SpO2 99%   BMI 43.38 kg/m    Examination  Gen: Alert and oriented x 3, no acute distress, morbidly obese HEENT: [place]/AT, no scleral icterus, no pale  conjunctivae, hearing normal, oral mucosa moist Neck: Supple Cardio: Regular rate and rhythm; +S1 and S2 Resp: CTAB GI: nondistended GU: Musc: Extremities: No pedal edema Skin: No rashes, lesions, or ecchymoses Neuro: grossly non focal, has a cane for walking  Psych: Calm, cooperative    Lab Results HIV 1 RNA Quant  Date Value  08/23/2021 <20 DETECTED copies/mL (A)  10/19/2020 28 Copies/mL (H)  02/14/2020  289 Copies/mL (H)   CD4 T Cell Abs (/uL)  Date Value  02/14/2020 493  09/27/2019 484  09/06/2019 786   No results found for: "HIV1GENOSEQ" Lab Results  Component Value Date   WBC 5.5 08/23/2021   HGB 10.7 (L) 08/23/2021   HCT 34.6 (L) 08/23/2021   MCV 81.2 08/23/2021   PLT 295 08/23/2021    Lab Results  Component Value Date   CREATININE 0.86 08/23/2021   BUN 15 08/23/2021   NA 137 08/23/2021   K 3.8 08/23/2021   CL 104 08/23/2021   CO2 26 08/23/2021   Lab Results  Component Value Date   ALT 7 08/23/2021   AST 11 08/23/2021   ALKPHOS 58 12/03/2016   BILITOT 0.5 08/23/2021    Lab Results  Component Value Date   CHOL 197 08/23/2021   TRIG 95 08/23/2021   HDL 40 (L) 08/23/2021   LDLCALC 137 (H) 08/23/2021   No results found for: "HAV" Lab Results  Component Value Date   HEPBSAG NO 08/24/2006   HEPBSAB INDETER (A) 12/27/2013   Lab Results  Component Value Date   HCVAB NO 08/24/2006   Lab Results  Component Value Date   CHLAMYDIAWP Negative 10/14/2019   N Negative 10/14/2019   No results found for: "GCPROBEAPT" No results found for: "QUANTGOLD"  Health Maintenance: Immunization History  Administered Date(s) Administered   H1N1 07/03/2008   Hepatitis B 02/11/2006, 05/20/2006, 08/20/2006   Influenza Split 03/10/2012   Influenza Whole 07/22/2006, 03/04/2011   Influenza,inj,Quad PF,6+ Mos 04/21/2013, 07/26/2014, 07/08/2017, 08/24/2018, 09/06/2019, 03/28/2021   Meningococcal Mcv4o 12/10/2016   PFIZER(Purple Top)SARS-COV-2 Vaccination 10/29/2019, 11/26/2019   Pfizer Covid-19 Vaccine Bivalent Booster 53yr & up 04/23/2021   Pneumococcal Conjugate-13 11/18/2017   Pneumococcal Polysaccharide-23 02/11/2006, 04/25/2011, 12/23/2018   Tdap 08/23/2021   Zoster Recombinat (Shingrix) 08/29/2021     Assessment/Plan: # HIV Continue Dovato, meds refilled Labs today  Fu in 5-6 months   # STD Screening  Urine GC and RPR today No acute concerns   #  Immunization counseling  Reports getting Flu vaccine   #Health maintenance She has recently established care with PCP and discussed to fu with PCP regarding colonoscopy, mammogram, PAP smear Lipid panel  [facility]  Patient's labs were reviewed as well as his previous records. Patients questions were addressed and answered. Safe sex counseling done.   I have personally spent 55 minutes involved in face-to-face and non-face-to-face activities for this patient on the day of the visit. Professional time spent includes the following activities: Preparing to see the patient (review of tests), Obtaining and/or reviewing separately obtained history (admission/discharge record), Performing a medically appropriate examination and/or evaluation , Ordering medications/tests/procedures, referring and communicating with other health care professionals, Documenting clinical information in the EMR, Independently interpreting results (not separately reported), Communicating results to the patient/family/caregiver, Counseling and educating the patient/family/caregiver and Care coordination (not separately reported).    Electronically signed by:  SRosiland Oz MD Infectious Disease Physician [facility] [address]  [place], [place] [address] Phone: [phone]  Fax: [phone]

## 2022-08-26 LAB — T-HELPER CELLS (CD4) COUNT (NOT AT ARMC)
CD4 % Helper T Cell: 37 % (ref 33–65)
CD4 T Cell Abs: 847 /uL (ref 400–1790)

## 2022-08-26 LAB — URINE CYTOLOGY ANCILLARY ONLY
Chlamydia: NEGATIVE
Comment: NEGATIVE
Comment: NORMAL
Neisseria Gonorrhea: NEGATIVE

## 2022-08-27 ENCOUNTER — Telehealth: Payer: Self-pay

## 2022-08-27 NOTE — Telephone Encounter (Signed)
-----   Message from Rosiland Oz, MD sent at 08/27/2022  9:55 AM EST ----- Please let her know that her LDL has been staying high and she will need to fu with PCP to see if she needs to be started on medications for it. I recommend eating more fruits, vegetables and regular exercise to help with high cholesterol.   Her potassium is mildly low and can eat food rich in potassium like banana, oranges, spinach, broccoli, potatoes etc

## 2022-08-27 NOTE — Telephone Encounter (Signed)
Called patient to relay results, no answer Left HIPAA compliant voicemail requesting callback.   Beryle Flock, RN

## 2022-08-28 NOTE — Telephone Encounter (Signed)
Spoke with Anita Pacheco, relayed provider's recommendations regarding LDL cholesterol and potassium. Patient reports she already eats foods rich in potassium. Encouraged her to discuss elevated cholesterol and low potassium with PCP for further management. Patient verbalized understanding and has no further questions.   Beryle Flock, RN

## 2022-08-28 NOTE — Telephone Encounter (Signed)
Second attempt to reach patient, no answer. Message left with previous attempt.   Beryle Flock, RN

## 2022-08-29 LAB — CBC
HCT: 36.6 % (ref 35.0–45.0)
Hemoglobin: 11.3 g/dL — ABNORMAL LOW (ref 11.7–15.5)
MCH: 24 pg — ABNORMAL LOW (ref 27.0–33.0)
MCHC: 30.9 g/dL — ABNORMAL LOW (ref 32.0–36.0)
MCV: 77.7 fL — ABNORMAL LOW (ref 80.0–100.0)
MPV: 11 fL (ref 7.5–12.5)
Platelets: 266 10*3/uL (ref 140–400)
RBC: 4.71 10*6/uL (ref 3.80–5.10)
RDW: 17.2 % — ABNORMAL HIGH (ref 11.0–15.0)
WBC: 6.1 10*3/uL (ref 3.8–10.8)

## 2022-08-29 LAB — COMPREHENSIVE METABOLIC PANEL
AG Ratio: 1 (calc) (ref 1.0–2.5)
ALT: 10 U/L (ref 6–29)
AST: 13 U/L (ref 10–35)
Albumin: 4 g/dL (ref 3.6–5.1)
Alkaline phosphatase (APISO): 66 U/L (ref 37–153)
BUN: 12 mg/dL (ref 7–25)
CO2: 31 mmol/L (ref 20–32)
Calcium: 9.2 mg/dL (ref 8.6–10.4)
Chloride: 101 mmol/L (ref 98–110)
Creat: 0.81 mg/dL (ref 0.50–1.03)
Globulin: 3.9 g/dL (calc) — ABNORMAL HIGH (ref 1.9–3.7)
Glucose, Bld: 98 mg/dL (ref 65–99)
Potassium: 3.3 mmol/L — ABNORMAL LOW (ref 3.5–5.3)
Sodium: 141 mmol/L (ref 135–146)
Total Bilirubin: 0.3 mg/dL (ref 0.2–1.2)
Total Protein: 7.9 g/dL (ref 6.1–8.1)

## 2022-08-29 LAB — HIV RNA, RTPCR W/R GT (RTI, PI,INT)
HIV 1 RNA Quant: <20 copies/mL — AB
HIV-1 RNA Quant, Log: <1.3 Log copies/mL — AB

## 2022-08-29 LAB — LIPID PANEL
Cholesterol: 225 mg/dL — ABNORMAL HIGH (ref <200)
HDL: 51 mg/dL (ref >=50)
LDL Cholesterol (Calc): 140 mg/dL (calc) — ABNORMAL HIGH
Non-HDL Cholesterol (Calc): 174 mg/dL (calc) — ABNORMAL HIGH (ref <130)
Total CHOL/HDL Ratio: 4.4 (calc) (ref <5.0)
Triglycerides: 203 mg/dL — ABNORMAL HIGH (ref <150)

## 2022-08-29 LAB — RPR: RPR Ser Ql: NONREACTIVE

## 2022-09-11 DIAGNOSIS — R7303 Prediabetes: Secondary | ICD-10-CM | POA: Diagnosis not present

## 2022-09-11 DIAGNOSIS — Z136 Encounter for screening for cardiovascular disorders: Secondary | ICD-10-CM | POA: Diagnosis not present

## 2022-09-11 DIAGNOSIS — Z0001 Encounter for general adult medical examination with abnormal findings: Secondary | ICD-10-CM | POA: Diagnosis not present

## 2022-09-11 DIAGNOSIS — Z1329 Encounter for screening for other suspected endocrine disorder: Secondary | ICD-10-CM | POA: Diagnosis not present

## 2022-09-11 DIAGNOSIS — Z131 Encounter for screening for diabetes mellitus: Secondary | ICD-10-CM | POA: Diagnosis not present

## 2022-09-11 DIAGNOSIS — J4530 Mild persistent asthma, uncomplicated: Secondary | ICD-10-CM | POA: Diagnosis not present

## 2022-09-11 DIAGNOSIS — I10 Essential (primary) hypertension: Secondary | ICD-10-CM | POA: Diagnosis not present

## 2022-09-11 DIAGNOSIS — R5383 Other fatigue: Secondary | ICD-10-CM | POA: Diagnosis not present

## 2022-09-11 DIAGNOSIS — I82401 Acute embolism and thrombosis of unspecified deep veins of right lower extremity: Secondary | ICD-10-CM | POA: Diagnosis not present

## 2022-10-27 DIAGNOSIS — I10 Essential (primary) hypertension: Secondary | ICD-10-CM | POA: Diagnosis not present

## 2022-10-27 DIAGNOSIS — E876 Hypokalemia: Secondary | ICD-10-CM | POA: Diagnosis not present

## 2022-10-27 DIAGNOSIS — I82401 Acute embolism and thrombosis of unspecified deep veins of right lower extremity: Secondary | ICD-10-CM | POA: Diagnosis not present

## 2022-10-27 DIAGNOSIS — J4530 Mild persistent asthma, uncomplicated: Secondary | ICD-10-CM | POA: Diagnosis not present

## 2022-12-10 NOTE — Progress Notes (Signed)
The 10-year ASCVD risk score (Arnett DK, et al., 2019) is: 6.3%   Values used to calculate the score:     Age: 52 years     Sex: Female     Is Non-Hispanic African American: Yes     Diabetic: No     Tobacco smoker: No     Systolic Blood Pressure: 137 mmHg     Is BP treated: Yes     HDL Cholesterol: 51 mg/dL     Total Cholesterol: 225 mg/dL  [name], RN

## 2023-01-21 ENCOUNTER — Ambulatory Visit: Payer: Self-pay

## 2023-01-21 ENCOUNTER — Ambulatory Visit: Payer: Self-pay | Admitting: Infectious Diseases

## 2023-01-29 ENCOUNTER — Telehealth: Payer: Self-pay

## 2023-01-29 NOTE — Telephone Encounter (Signed)
Nyema left a message stating no one followed up from her previous voicemail. I followed up on 7/24 and 8/1, both attempts the call went straight to voicemail. During her message she left a different phone number, [phone]. I have been unable to make contact to verify if this is an updated phone number.

## 2023-02-04 DIAGNOSIS — M2550 Pain in unspecified joint: Secondary | ICD-10-CM | POA: Diagnosis not present

## 2023-02-04 DIAGNOSIS — E876 Hypokalemia: Secondary | ICD-10-CM | POA: Diagnosis not present

## 2023-02-13 ENCOUNTER — Other Ambulatory Visit: Payer: Self-pay

## 2023-02-13 ENCOUNTER — Ambulatory Visit: Payer: Self-pay

## 2023-02-19 NOTE — Progress Notes (Signed)
Name: Anita Pacheco  DOB: Oct 08, 1970 MRN: [id number] PCP: Jackie Plum, MD    Subjective:   Chief Complaint  Patient presents with   Follow-up      HPI: Anita Pacheco is a 52 y.o. female with well controlled HIV, working with [facility] since 2008. Stage   Previous Regimens Include: Atripla Complera Dovato [id number]   Genotype:   HPI: She was previously following with Dr. Ninetta Lights and most recently seen by Dr. Elinor Parkinson. She is doing well on once daily Dovato for treatment and has no trouble with access or adherence. LOV was in February of this year. VL < 20 and CD4 847. She says she has not always been on her treatments but seems to be tolerating the Dovato well and receives it via mail routinely. Takes it when she goes to sleep so she does not forget it.   Pap smear last completed in Aug 2023 and due in 2026. Study at the time revealed normal cytology and negative HPV.   She takes her blood pressure at night routinely and follows with a PCP.   She is currently un housed for a few weeks. She met with our financial team and has been referred to [facility] for assistance. She is staying with her cousin for now. She is trying to work a lot so she can get some money. 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 Lajoyce Corners for knee pain/arthritis. Had a joint injection she did not tolerate well. She uses a cane to walk. Discussions about knee replacement have been had, weight loss recommended first.  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        02/20/2023    9:15 AM  Depression screen PHQ 2/9  Decreased Interest 0  Down, Depressed, Hopeless 0  PHQ - 2 Score 0    Review of Systems  Constitutional:  Negative for chills and fever.  HENT:  Negative for tinnitus.   Eyes:  Negative for blurred vision and photophobia.  Respiratory:  Negative for cough and sputum production.   Cardiovascular:  Negative for chest  pain.  Gastrointestinal:  Negative for diarrhea, nausea and vomiting.  Genitourinary:  Negative for dysuria.  Musculoskeletal:  Positive for joint pain (right).  Skin:  Negative for rash.  Neurological:  Negative for headaches.     Past Medical History:  Diagnosis Date   ANXIETY 07/22/2006   ASTHMA 07/22/2006   Breast mass 04/25/2011   Carpal tunnel syndrome 07/22/2006   HERPES ZOSTER 05/28/2010   HIV (human immunodeficiency virus infection) (HCC)    HYPERTENSION 07/22/2006   PERIPHERAL NEUROPATHY 07/03/2008   RLS (restless legs syndrome)     Outpatient Medications Prior to Visit  Medication Sig Dispense Refill   albuterol (VENTOLIN HFA) 108 (90 Base) MCG/ACT inhaler INHALE 2 PUFFS INTO THE LUNGS EVERY 6 HOURS AS NEEDED FOR WHEEZING OR SHORTNESS OF BREATH 6.7 g 1   amLODipine (NORVASC) 10 MG tablet Take 10 mg by mouth daily.     aspirin EC 81 MG tablet Take 81 mg by mouth daily. Swallow whole.     diclofenac sodium (VOLTAREN) 1 % GEL Apply 1 application topically 4 (four) times daily. 100 g 6   docusate sodium (COLACE) 100 MG capsule Take 1 capsule (100 mg total) by mouth 2 (two) times daily. 180 capsule 3   dolutegravir-lamiVUDine (DOVATO) 50-300 MG tablet Take 1 tablet by mouth daily. 30 tablet  5   DULoxetine (CYMBALTA) 30 MG capsule 1 cap(s) orally 2 times a day for 30 days     ferrous sulfate 300 (60 Fe) MG/5ML syrup Take 5 mLs (300 mg total) by mouth daily. 150 mL 3   fluticasone (FLONASE) 50 MCG/ACT nasal spray Place 1 spray into both nostrils daily as needed for allergies or rhinitis. 16 g 3   Fluticasone-Salmeterol 113-14 MCG/ACT AEPB Inhale 1 Dose into the lungs daily. 1 each 3   hydrochlorothiazide (HYDRODIURIL) 25 MG tablet Take 25 mg by mouth daily.     levocetirizine (XYZAL) 5 MG tablet TAKE ONE TABLET EVERY EVENING 31 tablet 0   meloxicam (MOBIC) 15 MG tablet 1 tab(s) orally once a day for 30 day(s)     omeprazole (PRILOSEC) 40 MG capsule TAKE 1 CAPSULE(40 MG) BY  MOUTH DAILY (Patient taking differently: Take 40 mg by mouth 2 (two) times daily as needed.) 30 capsule 3   pregabalin (LYRICA) 100 MG capsule Take 100 mg by mouth 2 (two) times daily.     Prenatal Vit-Fe Fumarate-FA (PRENATAL PLUS/IRON) 27-1 MG TABS Take 1 tablet by mouth daily. 180 each 0   sodium chloride (OCEAN) 0.65 % nasal spray Place 1 spray into the nose as needed for congestion. (Patient not taking: Reported on 08/25/2022) 15 mL 12   No facility-administered medications prior to visit.     Allergies  Allergen Reactions   Hydrocodone Hives and Itching    Excessive vomiting   Enalapril Rash    Social History   Tobacco Use   Smoking status: Never   Smokeless tobacco: Never  Vaping Use   Vaping status: Never Used  Substance Use Topics   Alcohol use: Yes    Comment: holidays   Drug use: No    Family History  Problem Relation Age of Onset   CAD Mother        cardiomyopathy   GER disease Mother    Hypertension Mother    Pancreatic cancer Father        mets to colon  and liver   CAD Father    Throat cancer Paternal Grandmother    Asthma Daughter    Asthma Daughter    Lymphoma Maternal Aunt    Ovarian cancer Maternal Aunt    Kidney disease Son     Social History   Substance and Sexual Activity  Sexual Activity Not Currently   Comment: pt. declined condoms     Objective:   Vitals:   02/20/23 0911  BP: (!) 146/103  Pulse: 78  Resp: 16  Weight: 269 lb (122 kg)  Height: 5\' 7"  (1.702 m)   Body mass index is 42.13 kg/m.  Physical Exam Musculoskeletal:       Legs:     Comments: Tenderness and swelling to the lateral portion of knee. Pain with full extension. +Crepitus      Lab Results Lab Results  Component Value Date   WBC 6.1 08/25/2022   HGB 11.3 (L) 08/25/2022   HCT 36.6 08/25/2022   MCV 77.7 (L) 08/25/2022   PLT 266 08/25/2022    Lab Results  Component Value Date   CREATININE 0.81 08/25/2022   BUN 12 08/25/2022   NA 141 08/25/2022    K 3.3 (L) 08/25/2022   CL 101 08/25/2022   CO2 31 08/25/2022    Lab Results  Component Value Date   ALT 10 08/25/2022   AST 13 08/25/2022   ALKPHOS 58 12/03/2016   BILITOT 0.3  08/25/2022    Lab Results  Component Value Date   CHOL 225 (H) 08/25/2022   HDL 51 08/25/2022   LDLCALC 140 (H) 08/25/2022   TRIG 203 (H) 08/25/2022   CHOLHDL 4.4 08/25/2022   HIV 1 RNA Quant  Date Value  08/25/2022 <20 DETECTED copies/mL (A)  08/23/2021 <20 DETECTED copies/mL (A)  10/19/2020 28 Copies/mL (H)   CD4 T Cell Abs (/uL)  Date Value  08/25/2022 847  02/14/2020 493  09/27/2019 484     Assessment & Plan:   Problem List Items Addressed This Visit       Unprioritized   Housing insecurity    She shared with me she has been un-housed for a few weeks now - [facility] is working with her on helping with this. She fortunately has temporary housing through family available to her at the moment but previously spent some time living in her car.      Human immunodeficiency virus (HIV) disease (HCC) - Primary (Chronic)    Very well controlled on once daily Dovato. No concerns with access or adherence to medication. They are tolerating the medication well without side effects. No drug interactions identified  (she separates supplements/vitamins as recommended).  Lab tests reviewed from other providers and VL < 20 with healthy immunologic recovery, last CD4 > 800. We discussed these today.  No changes to insurance coverage.  No dental needs today.  No concern over anxious/depressed mood.  Sexual health and family planning discussed - no needs today - she had pap smear in 2023 - due in 2026.  On statin for primary cardiac protection.    Return in about 4 months (around 06/22/2023). - she is interested in [place] information. Will review to see if she is a candidate for this given NNRTI hx and intermittent adherence mentioned to make sure and further discussions next visit in the winter.         Relevant Medications   dolutegravir-lamiVUDine (DOVATO) 50-300 MG tablet   Other Relevant Orders   AMB REFERRAL TO [facility]   Pain and swelling of right knee    Saw Dr. Lajoyce Corners - 08-27-21; expect R TKR after wt loss, which is hard for her to do, however she is down from 315 lb.  Xrays reviewed - she has a lot of lateral pain and describes clicking/popping. She is interested in non-surgical options and open to physical therapy. She expressed interest in possible PRP injections as well.  She agreed to referral to sports medicine.  Topical voltaren and ice before bed recommended as well.       Relevant Orders   AMB referral to sports medicine   Time spent during encounter: 50 minutes including chart review of previous ID related records dating from 2008 - current including treatment history, adherence history, health screenings related to [facility], face to face discussion.   Rexene Alberts, MSN, NP-C [facility] for Infectious Disease [facility] Pager: [phone] Office: [phone]  02/20/23  9:16 AM

## 2023-02-20 ENCOUNTER — Encounter: Payer: Self-pay | Admitting: Infectious Diseases

## 2023-02-20 ENCOUNTER — Other Ambulatory Visit: Payer: Self-pay

## 2023-02-20 ENCOUNTER — Ambulatory Visit (INDEPENDENT_AMBULATORY_CARE_PROVIDER_SITE_OTHER): Payer: BC Managed Care – PPO | Admitting: Infectious Diseases

## 2023-02-20 VITALS — BP 146/103 | HR 78 | Resp 16 | Ht 67.0 in | Wt 269.0 lb

## 2023-02-20 DIAGNOSIS — M25561 Pain in right knee: Secondary | ICD-10-CM

## 2023-02-20 DIAGNOSIS — B20 Human immunodeficiency virus [HIV] disease: Secondary | ICD-10-CM | POA: Diagnosis not present

## 2023-02-20 DIAGNOSIS — M25461 Effusion, right knee: Secondary | ICD-10-CM | POA: Diagnosis not present

## 2023-02-20 DIAGNOSIS — Z59819 Housing instability, housed unspecified: Secondary | ICD-10-CM | POA: Diagnosis not present

## 2023-02-20 MED ORDER — DOVATO 50-300 MG PO TABS
1.0000 | ORAL_TABLET | Freq: Every day | ORAL | 11 refills | Status: DC
Start: 2023-02-20 — End: 2024-03-01

## 2023-02-20 NOTE — Assessment & Plan Note (Signed)
Saw Dr. Lajoyce Corners - 08-27-21; expect R TKR after wt loss, which is hard for her to do, however she is down from 315 lb.  Xrays reviewed - she has a lot of lateral pain and describes clicking/popping. She is interested in non-surgical options and open to physical therapy. She expressed interest in possible PRP injections as well.  She agreed to referral to sports medicine.  Topical voltaren and ice before bed recommended as well.

## 2023-02-20 NOTE — Assessment & Plan Note (Addendum)
Very well controlled on once daily Dovato. No concerns with access or adherence to medication. They are tolerating the medication well without side effects. No drug interactions identified  (she separates supplements/vitamins as recommended).  Lab tests reviewed from other providers and VL < 20 with healthy immunologic recovery, last CD4 > 800. We discussed these today.  No changes to insurance coverage.  No dental needs today.  No concern over anxious/depressed mood.  Sexual health and family planning discussed - no needs today - she had pap smear in 2023 - due in 2026.  On statin for primary cardiac protection.    Return in about 4 months (around 06/22/2023). - she is interested in [place] information. Will review to see if she is a candidate for this given NNRTI hx and intermittent adherence mentioned to make sure and further discussions next visit in the winter.

## 2023-02-20 NOTE — Assessment & Plan Note (Signed)
She shared with me she has been un-housed for a few weeks now - [facility] is working with her on helping with this. She fortunately has temporary housing through family available to her at the moment but previously spent some time living in her car.

## 2023-02-20 NOTE — Patient Instructions (Addendum)
Nice to meet you - will need to update your blood work today  Dr. Terrilee Files is who I will refer you to [facility] at [facility] [address] [place], [place] [address] [phone]   Volteren Gel for your knee up to 4 times a day may also help with the knee pain - it is a cream that goes on over the knee where the pain is     Will see you back in 4 months (ask for a 30 minute appointment please)

## 2023-03-06 DIAGNOSIS — R7303 Prediabetes: Secondary | ICD-10-CM | POA: Diagnosis not present

## 2023-03-06 DIAGNOSIS — J4530 Mild persistent asthma, uncomplicated: Secondary | ICD-10-CM | POA: Diagnosis not present

## 2023-03-06 DIAGNOSIS — M2550 Pain in unspecified joint: Secondary | ICD-10-CM | POA: Diagnosis not present

## 2023-03-06 DIAGNOSIS — I10 Essential (primary) hypertension: Secondary | ICD-10-CM | POA: Diagnosis not present

## 2023-03-16 NOTE — Progress Notes (Deleted)
[facility] [address] [place] [address] Phone: [phone] Subjective:    I'm seeing this patient by the request  of:  Osei-Bonsu, Greggory Stallion, MD  CC: right knee exam   [id number]  Anita Pacheco is a 52 y.o. female coming in with complaint of R knee pain.  Past medical history is HIV patient states        Past Medical History:  Diagnosis Date   ANXIETY 07/22/2006   ASTHMA 07/22/2006   Breast mass 04/25/2011   Carpal tunnel syndrome 07/22/2006   HERPES ZOSTER 05/28/2010   HIV (human immunodeficiency virus infection) (HCC)    HYPERTENSION 07/22/2006   PERIPHERAL NEUROPATHY 07/03/2008   RLS (restless legs syndrome)    Past Surgical History:  Procedure Laterality Date   CESAREAN SECTION     ENDOVENOUS ABLATION SAPHENOUS VEIN W/ LASER Right 12/22/2017   endovenous laser ablation right greater saphenous vein by Josephina Gip MD   STAPEDES SURGERY Right    Social History   Socioeconomic History   Marital status: Single    Spouse name: Not on file   Number of children: 3   Years of education: Not on file   Highest education level: Not on file  Occupational History   Occupation: med tech  Tobacco Use   Smoking status: Never   Smokeless tobacco: Never  Vaping Use   Vaping status: Never Used  Substance and Sexual Activity   Alcohol use: Yes    Comment: holidays   Drug use: No   Sexual activity: Not Currently    Comment: pt. declined condoms  Other Topics Concern   Not on file  Social History Narrative   Not on file   Social Determinants of Health   Financial Resource Strain: Not on file  Food Insecurity: Not on file  Transportation Needs: Not on file  Physical Activity: Not on file  Stress: Not on file  Social Connections: Not on file   Allergies  Allergen Reactions   Hydrocodone Hives and Itching    Excessive vomiting   Enalapril Rash   Family History  Problem Relation Age of Onset   CAD Mother         cardiomyopathy   GER disease Mother    Hypertension Mother    Pancreatic cancer Father        mets to colon  and liver   CAD Father    Throat cancer Paternal Grandmother    Asthma Daughter    Asthma Daughter    Lymphoma Maternal Aunt    Ovarian cancer Maternal Aunt    Kidney disease Son      Current Outpatient Medications (Cardiovascular):    amLODipine (NORVASC) 10 MG tablet, Take 10 mg by mouth daily.   hydrochlorothiazide (HYDRODIURIL) 25 MG tablet, Take 25 mg by mouth daily.  Current Outpatient Medications (Respiratory):    albuterol (VENTOLIN HFA) 108 (90 Base) MCG/ACT inhaler, INHALE 2 PUFFS INTO THE LUNGS EVERY 6 HOURS AS NEEDED FOR WHEEZING OR SHORTNESS OF BREATH   fluticasone (FLONASE) 50 MCG/ACT nasal spray, Place 1 spray into both nostrils daily as needed for allergies or rhinitis.   Fluticasone-Salmeterol 113-14 MCG/ACT AEPB, Inhale 1 Dose into the lungs daily.   levocetirizine (XYZAL) 5 MG tablet, TAKE ONE TABLET EVERY EVENING   sodium chloride (OCEAN) 0.65 % nasal spray, Place 1 spray into the nose as needed for congestion. (Patient not taking: Reported on 08/25/2022)  Current Outpatient Medications (Analgesics):    aspirin EC  81 MG tablet, Take 81 mg by mouth daily. Swallow whole.   meloxicam (MOBIC) 15 MG tablet, 1 tab(s) orally once a day for 30 day(s)  Current Outpatient Medications (Hematological):    ferrous sulfate 300 (60 Fe) MG/5ML syrup, Take 5 mLs (300 mg total) by mouth daily.  Current Outpatient Medications (Other):    diclofenac sodium (VOLTAREN) 1 % GEL, Apply 1 application topically 4 (four) times daily.   docusate sodium (COLACE) 100 MG capsule, Take 1 capsule (100 mg total) by mouth 2 (two) times daily.   dolutegravir-lamiVUDine (DOVATO) 50-300 MG tablet, Take 1 tablet by mouth daily.   DULoxetine (CYMBALTA) 30 MG capsule, 1 cap(s) orally 2 times a day for 30 days   omeprazole (PRILOSEC) 40 MG capsule, TAKE 1 CAPSULE(40 MG) BY MOUTH DAILY (Patient  taking differently: Take 40 mg by mouth 2 (two) times daily as needed.)   pregabalin (LYRICA) 100 MG capsule, Take 100 mg by mouth 2 (two) times daily.   Prenatal Vit-Fe Fumarate-FA (PRENATAL PLUS/IRON) 27-1 MG TABS, Take 1 tablet by mouth daily.   Reviewed prior external information including notes and imaging from  primary care provider As well as notes that were available from care everywhere and other healthcare systems.  Past medical history, social, surgical and family history all reviewed in electronic medical record.  No pertanent information unless stated regarding to the chief complaint.   Review of Systems:  No headache, visual changes, nausea, vomiting, diarrhea, constipation, dizziness, abdominal pain, skin rash, fevers, chills, night sweats, weight loss, swollen lymph nodes, body aches, joint swelling, chest pain, shortness of breath, mood changes. POSITIVE muscle aches  Objective  There were no vitals taken for this visit.   General: No apparent distress alert and oriented x3 mood and affect normal, dressed appropriately.  HEENT: Pupils equal, extraocular movements intact  Respiratory: Patient's speak in full sentences and does not appear short of breath  Cardiovascular: No lower extremity edema, non tender, no erythema  Right knee exam shows    Impression and Recommendations:    The above documentation has been reviewed and is accurate and complete Judi Saa, DO

## 2023-03-17 ENCOUNTER — Ambulatory Visit: Payer: BC Managed Care – PPO | Admitting: Family Medicine

## 2023-03-18 DIAGNOSIS — M2550 Pain in unspecified joint: Secondary | ICD-10-CM | POA: Diagnosis not present

## 2023-04-21 NOTE — Progress Notes (Unsigned)
[facility] [address] [place] [address] Phone: [phone] Subjective:   Bruce Donath, am serving as a scribe for Dr. Antoine Primas.  I'm seeing this patient by the request  of:  Osei-Bonsu, Greggory Stallion, MD  CC: Right knee pain  [id number]  Shauntia Bodamer is a 52 y.o. female coming in with complaint of R knee pain. Patient states that she has pain in anterior aspect. Notes pain and stiffness after being seated. Had injection one year ago. Pain is constant and she gets little relief from medications that she takes. Takes meloxicam, lcelebrex, yrica, and cymbalta but does not feel that they are helping. Ambulates with a cane.   Also has R ankle and hip pain that started after the knee started bothering her. Saw rheumatology and put on prednisone. No relief from pain.     Xrays 2021 did have arthritis noted   Past Medical History:  Diagnosis Date   ANXIETY 07/22/2006   ASTHMA 07/22/2006   Breast mass 04/25/2011   Carpal tunnel syndrome 07/22/2006   HERPES ZOSTER 05/28/2010   HIV (human immunodeficiency virus infection) (HCC)    HYPERTENSION 07/22/2006   PERIPHERAL NEUROPATHY 07/03/2008   RLS (restless legs syndrome)    Past Surgical History:  Procedure Laterality Date   CESAREAN SECTION     ENDOVENOUS ABLATION SAPHENOUS VEIN W/ LASER Right 12/22/2017   endovenous laser ablation right greater saphenous vein by Josephina Gip MD   STAPEDES SURGERY Right    Social History   Socioeconomic History   Marital status: Single    Spouse name: Not on file   Number of children: 3   Years of education: Not on file   Highest education level: Not on file  Occupational History   Occupation: med tech  Tobacco Use   Smoking status: Never   Smokeless tobacco: Never  Vaping Use   Vaping status: Never Used  Substance and Sexual Activity   Alcohol use: Yes    Comment: holidays   Drug use: No   Sexual activity: Not Currently    Comment: pt.  declined condoms  Other Topics Concern   Not on file  Social History Narrative   Not on file   Social Determinants of Health   Financial Resource Strain: Not on file  Food Insecurity: Not on file  Transportation Needs: Not on file  Physical Activity: Not on file  Stress: Not on file  Social Connections: Not on file   Allergies  Allergen Reactions   Hydrocodone Hives and Itching    Excessive vomiting   Enalapril Rash   Family History  Problem Relation Age of Onset   CAD Mother        cardiomyopathy   GER disease Mother    Hypertension Mother    Pancreatic cancer Father        mets to colon  and liver   CAD Father    Throat cancer Paternal Grandmother    Asthma Daughter    Asthma Daughter    Lymphoma Maternal Aunt    Ovarian cancer Maternal Aunt    Kidney disease Son      Current Outpatient Medications (Cardiovascular):    amLODipine (NORVASC) 10 MG tablet, Take 10 mg by mouth daily.   hydrochlorothiazide (HYDRODIURIL) 25 MG tablet, Take 25 mg by mouth daily.  Current Outpatient Medications (Respiratory):    albuterol (VENTOLIN HFA) 108 (90 Base) MCG/ACT inhaler, INHALE 2 PUFFS INTO THE LUNGS EVERY 6 HOURS AS NEEDED  FOR WHEEZING OR SHORTNESS OF BREATH   fluticasone (FLONASE) 50 MCG/ACT nasal spray, Place 1 spray into both nostrils daily as needed for allergies or rhinitis.   levocetirizine (XYZAL) 5 MG tablet, TAKE ONE TABLET EVERY EVENING   sodium chloride (OCEAN) 0.65 % nasal spray, Place 1 spray into the nose as needed for congestion.   Fluticasone-Salmeterol 113-14 MCG/ACT AEPB, Inhale 1 Dose into the lungs daily.  Current Outpatient Medications (Analgesics):    aspirin EC 81 MG tablet, Take 81 mg by mouth daily. Swallow whole.   meloxicam (MOBIC) 15 MG tablet, 1 tab(s) orally once a day for 30 day(s)  Current Outpatient Medications (Hematological):    ferrous sulfate 300 (60 Fe) MG/5ML syrup, Take 5 mLs (300 mg total) by mouth daily.  Current Outpatient  Medications (Other):    diclofenac sodium (VOLTAREN) 1 % GEL, Apply 1 application topically 4 (four) times daily.   docusate sodium (COLACE) 100 MG capsule, Take 1 capsule (100 mg total) by mouth 2 (two) times daily.   dolutegravir-lamiVUDine (DOVATO) 50-300 MG tablet, Take 1 tablet by mouth daily.   omeprazole (PRILOSEC) 40 MG capsule, TAKE 1 CAPSULE(40 MG) BY MOUTH DAILY (Patient taking differently: Take 40 mg by mouth 2 (two) times daily as needed.)   pregabalin (LYRICA) 100 MG capsule, Take 100 mg by mouth 2 (two) times daily.   Prenatal Vit-Fe Fumarate-FA (PRENATAL PLUS/IRON) 27-1 MG TABS, Take 1 tablet by mouth daily.   DULoxetine (CYMBALTA) 30 MG capsule, 1 cap(s) orally 2 times a day for 30 days   Reviewed prior external information including notes and imaging from  primary care provider As well as notes that were available from care everywhere and other healthcare systems.  Past medical history, social, surgical and family history all reviewed in electronic medical record.  No pertanent information unless stated regarding to the chief complaint.   Review of Systems:  No headache, visual changes, nausea, vomiting, diarrhea, constipation, dizziness, abdominal pain, skin rash, fevers, chills, night sweats, weight loss, swollen lymph nodes, body aches, joint swelling, chest pain, shortness of breath, mood changes. POSITIVE muscle aches  Objective  Blood pressure 128/84, pulse 84, height 5\' 7"  (1.702 m), weight 261 lb (118.4 kg), SpO2 96%.   General: No apparent distress alert and oriented x3 mood and affect normal, dressed appropriately.  HEENT: Pupils equal, extraocular movements intact  Respiratory: Patient's speak in full sentences and does not appear short of breath  Cardiovascular: No lower extremity edema, non tender, no erythema  Right knee exam shows patient does have some crepitus noted.  Some limited range of motion in all planes.  Abnormal thigh to calf ratio noted.  Patient  is ambulatory but does have an antalgic gait and favoring the right knee walking with the aid of a cane    Impression and Recommendations:    The above documentation has been reviewed and is accurate and complete Judi Saa, DO

## 2023-04-22 ENCOUNTER — Ambulatory Visit: Payer: BC Managed Care – PPO | Admitting: Family Medicine

## 2023-04-22 ENCOUNTER — Other Ambulatory Visit: Payer: Self-pay

## 2023-04-22 ENCOUNTER — Encounter: Payer: Self-pay | Admitting: Family Medicine

## 2023-04-22 VITALS — BP 128/84 | HR 84 | Ht 67.0 in | Wt 261.0 lb

## 2023-04-22 DIAGNOSIS — M1711 Unilateral primary osteoarthritis, right knee: Secondary | ICD-10-CM | POA: Diagnosis not present

## 2023-04-22 DIAGNOSIS — M25561 Pain in right knee: Secondary | ICD-10-CM

## 2023-04-22 NOTE — Patient Instructions (Addendum)
OA w tru pull ite Arnica lotion Tart cherry extract Aquatic PT: consider See me in 2-3 months

## 2023-04-22 NOTE — Assessment & Plan Note (Addendum)
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  We discussed with patient at great length about an OA stability brace with patient having falls previously.  Patient does have significantly an abnormal thigh to calf ratio as well as patient does have instability with valgus and varus force.  I do think a Tru pull lite is also necessary secondary to the lateral translocation of the patella at this moment.  We discussed the importance of some weight loss, continuing to be active though, discussed lower impact exercises in great detail.  Discussed the possibility of injections which patient wants to avoid secondary to severe pain from an outside facility.  Did discuss the possibility of needing a surgical intervention, BMI is over 40 would make her not a candidate at this time.  Encourage weight loss.  Follow-up again 6 to 8 weeks to see how patient is doing.  Total time with patient today 49 minutes with reviewing outside imaging and discussing with patient

## 2023-06-02 ENCOUNTER — Other Ambulatory Visit: Payer: Self-pay

## 2023-06-02 DIAGNOSIS — Z113 Encounter for screening for infections with a predominantly sexual mode of transmission: Secondary | ICD-10-CM

## 2023-06-02 DIAGNOSIS — B20 Human immunodeficiency virus [HIV] disease: Secondary | ICD-10-CM

## 2023-06-03 ENCOUNTER — Other Ambulatory Visit: Payer: No Typology Code available for payment source

## 2023-06-19 ENCOUNTER — Ambulatory Visit: Payer: BC Managed Care – PPO | Admitting: Infectious Diseases

## 2023-07-01 ENCOUNTER — Other Ambulatory Visit: Payer: Self-pay

## 2023-07-01 ENCOUNTER — Ambulatory Visit
Admission: EM | Admit: 2023-07-01 | Discharge: 2023-07-01 | Disposition: A | Discharge status: Home / Self Care | Payer: BC Managed Care – PPO | Attending: Family Medicine | Admitting: Family Medicine

## 2023-07-01 ENCOUNTER — Emergency Department (HOSPITAL_BASED_OUTPATIENT_CLINIC_OR_DEPARTMENT_OTHER)
Admission: EM | Admit: 2023-07-01 | Discharge: 2023-07-01 | Disposition: A | Discharge status: Home / Self Care | Payer: BC Managed Care – PPO | Attending: Emergency Medicine | Admitting: Emergency Medicine

## 2023-07-01 ENCOUNTER — Encounter (HOSPITAL_BASED_OUTPATIENT_CLINIC_OR_DEPARTMENT_OTHER): Payer: Self-pay

## 2023-07-01 DIAGNOSIS — H10212 Acute toxic conjunctivitis, left eye: Secondary | ICD-10-CM | POA: Diagnosis not present

## 2023-07-01 DIAGNOSIS — T6594XA Toxic effect of unspecified substance, undetermined, initial encounter: Secondary | ICD-10-CM | POA: Insufficient documentation

## 2023-07-01 DIAGNOSIS — H1032 Unspecified acute conjunctivitis, left eye: Secondary | ICD-10-CM | POA: Diagnosis not present

## 2023-07-01 DIAGNOSIS — Z7982 Long term (current) use of aspirin: Secondary | ICD-10-CM | POA: Insufficient documentation

## 2023-07-01 DIAGNOSIS — Z77098 Contact with and (suspected) exposure to other hazardous, chiefly nonmedicinal, chemicals: Secondary | ICD-10-CM

## 2023-07-01 DIAGNOSIS — T2692XA Corrosion of left eye and adnexa, part unspecified, initial encounter: Secondary | ICD-10-CM | POA: Diagnosis not present

## 2023-07-01 DIAGNOSIS — H5712 Ocular pain, left eye: Secondary | ICD-10-CM | POA: Diagnosis present

## 2023-07-01 DIAGNOSIS — Z79899 Other long term (current) drug therapy: Secondary | ICD-10-CM | POA: Insufficient documentation

## 2023-07-01 MED ORDER — TOBRAMYCIN 0.3 % OP SOLN: 1.0000 [drp] | OPHTHALMIC | 0 refills | Status: AC

## 2023-07-01 MED ORDER — TETRACAINE HCL 0.5 % OP SOLN
1.0000 [drp] | Freq: Once | OPHTHALMIC | Status: AC
  Administered 2023-07-01: 1 [drp] via OPHTHALMIC
  Filled 2023-07-01: qty 4

## 2023-07-01 MED ORDER — OXYCODONE-ACETAMINOPHEN 5-325 MG PO TABS: 1.0000 | ORAL_TABLET | ORAL | 0 refills | Status: DC | PRN

## 2023-07-01 MED ORDER — FLUORESCEIN SODIUM 1 MG OP STRP
1.0000 | ORAL_STRIP | Freq: Once | OPHTHALMIC | Status: AC
  Administered 2023-07-01: 1 via OPHTHALMIC
  Filled 2023-07-01: qty 1

## 2023-07-01 MED ORDER — OXYCODONE-ACETAMINOPHEN 5-325 MG PO TABS
1.0000 | ORAL_TABLET | Freq: Once | ORAL | Status: AC
  Administered 2023-07-01: 1 via ORAL
  Filled 2023-07-01: qty 1

## 2023-07-01 NOTE — ED Triage Notes (Signed)
 Patient presents with foreign substance in her left eye. Patient states she was cleaning out the sink when hair and grease Drano splash into her eye. Patient does report some visual changes in her left eye. Patient states she wash her eye out with water.

## 2023-07-01 NOTE — ED Triage Notes (Signed)
 Pt had Drano splash into her left eye. Pt attempted to wash out her eye at home.

## 2023-07-01 NOTE — ED Notes (Signed)
 Misty Stanley from [employer], [phone]

## 2023-07-01 NOTE — Discharge Instructions (Addendum)
Please go to the ER for further evaluation of your injury

## 2023-07-01 NOTE — ED Provider Notes (Signed)
 [facility] Provider Note   CSN: [id number] Arrival date & time: 07/01/23  1551     History  Chief Complaint  Patient presents with   Eye Pain    Anita Pacheco is a 53 y.o. female.  Patient reports that around 2:00 noon today she was using Drano and splashed Drano into her left eye.  Patient reports that her eye has been burning and her face has been burning since the episode.  Patient reports washing her eye out at home.  She was seen at urgent care and her eye was irrigated copiously.  Patient does not have her glasses with her her visual acuity was 20/100 in both eyes.  Patient had a pH test at urgent care of 7 and repeat after irrigation which was 7.  Complains of continued irrigation and tearing.  Patient was advised to come here for evaluation  The history is provided by the patient. No language interpreter was used.  Eye Pain       Home Medications Prior to Admission medications   Medication Sig Start Date End Date Taking? Authorizing Provider  oxyCODONE -acetaminophen  (PERCOCET) 5-325 MG tablet Take 1 tablet by mouth every 4 (four) hours as needed for severe pain (pain score 7-10). 07/01/23 06/30/24 Yes Marlana Mckowen K, PA-C  tobramycin  (TOBREX ) 0.3 % ophthalmic solution Place 1 drop into the right eye every 4 (four) hours for 10 days. 07/01/23 07/11/23 Yes Flint Raring K, PA-C  albuterol  (VENTOLIN  HFA) 108 (90 Base) MCG/ACT inhaler INHALE 2 PUFFS INTO THE LUNGS EVERY 6 HOURS AS NEEDED FOR WHEEZING OR SHORTNESS OF BREATH 03/08/21   Eben Reyes BROCKS, MD  amLODipine  (NORVASC ) 10 MG tablet Take 10 mg by mouth daily. 07/08/21   [provider]  aspirin EC 81 MG tablet Take 81 mg by mouth daily. Swallow whole.    [provider]  diclofenac  sodium (VOLTAREN ) 1 % GEL Apply 1 application topically 4 (four) times daily. 11/22/15   Eben Reyes BROCKS, MD  docusate sodium  (COLACE) 100 MG capsule Take 1 capsule (100 mg total) by  mouth 2 (two) times daily. 01/25/21   Stacia Glendia BRAVO, MD  dolutegravir-lamiVUDine (DOVATO ) 50-300 MG tablet Take 1 tablet by mouth daily. 02/20/23   Melvenia Corean SAILOR, NP  DULoxetine (CYMBALTA) 30 MG capsule 1 cap(s) orally 2 times a day for 30 days 05/27/22 02/20/23  [provider]  ferrous sulfate  300 (60 Fe) MG/5ML syrup Take 5 mLs (300 mg total) by mouth daily. 01/25/21   Stacia Glendia BRAVO, MD  fluticasone  (FLONASE ) 50 MCG/ACT nasal spray Place 1 spray into both nostrils daily as needed for allergies or rhinitis. 03/05/21   Eben Reyes BROCKS, MD  Fluticasone -Salmeterol 113-14 MCG/ACT AEPB Inhale 1 Dose into the lungs daily. 03/05/21 02/20/23  Eben Reyes BROCKS, MD  hydrochlorothiazide (HYDRODIURIL) 25 MG tablet Take 25 mg by mouth daily. 07/08/21   [provider]  levocetirizine (XYZAL ) 5 MG tablet TAKE ONE TABLET EVERY EVENING 04/21/13   Eben Reyes BROCKS, MD  meloxicam  (MOBIC ) 15 MG tablet 1 tab(s) orally once a day for 30 day(s) 05/27/22   [provider]  omeprazole  (PRILOSEC) 40 MG capsule TAKE 1 CAPSULE(40 MG) BY MOUTH DAILY Patient taking differently: Take 40 mg by mouth 2 (two) times daily as needed. 08/28/20   Eben Reyes BROCKS, MD  pregabalin  (LYRICA ) 100 MG capsule Take 100 mg by mouth 2 (two) times daily. 07/21/22   [provider]  Prenatal Vit-Fe  Fumarate-FA (PRENATAL PLUS/IRON ) 27-1 MG TABS Take 1 tablet by mouth daily. 04/20/13   Eben Reyes BROCKS, MD  sodium chloride  (OCEAN) 0.65 % nasal spray Place 1 spray into the nose as needed for congestion. 06/08/12   Kalia-Reynolds, Maitri S, DO      Allergies    Hydrocodone and Enalapril     Review of Systems   Review of Systems  Eyes:  Positive for pain.  All other systems reviewed and are negative.   Physical Exam Updated Vital Signs BP (!) 160/102   Pulse 69   Temp 97.8 F (36.6 C) (Oral)   SpO2 97%  Physical Exam Vitals and nursing note reviewed.  Constitutional:      Appearance:  She is well-developed.  HENT:     Head: Normocephalic.  Eyes:     Extraocular Movements: Extraocular movements intact.     Conjunctiva/sclera: Conjunctivae normal.     Pupils: Pupils are equal, round, and reactive to light.     Comments: Injected left conjunctiva, tetracaine  drops fluorescein , patient has pain relief slit-lamp exam is normal.   Cardiovascular:     Rate and Rhythm: Normal rate.  Pulmonary:     Effort: Pulmonary effort is normal.  Abdominal:     General: There is no distension.  Skin:    General: Skin is warm.  Neurological:     General: No focal deficit present.     Mental Status: She is alert and oriented to person, place, and time.  Psychiatric:        Mood and Affect: Mood normal.     ED Results / Procedures / Treatments   Labs (all labs ordered are listed, but only abnormal results are displayed) Labs Reviewed - No data to display  EKG None  Radiology No results found.  Procedures Procedures    Medications Ordered in ED Medications  fluorescein  ophthalmic strip 1 strip (1 strip Left Eye Given by Other 07/01/23 1702)  tetracaine  (PONTOCAINE) 0.5 % ophthalmic solution 1 drop (1 drop Left Eye Given by Other 07/01/23 1702)  oxyCODONE -acetaminophen  (PERCOCET/ROXICET) 5-325 MG per tablet 1 tablet (1 tablet Oral Given 07/01/23 1746)    ED Course/ Medical Decision Making/ A&P                                 Medical Decision Making Patient reports she splashed Drano in her eye approximately 4 hours ago.  Risk Prescription drug management. Risk Details: Patient has a normal slitlike exam.  No evidence of abrasion or chemical burn.  Patient is given a dose of Percocet here she is given a prescription for 6 Percocet and Tobrex .  Patient is advised to schedule to see ophthalmology for follow-up.           Final Clinical Impression(s) / ED Diagnoses Final diagnoses:  Chemical exposure of eye    Rx / DC Orders ED Discharge Orders           Ordered    tobramycin  (TOBREX ) 0.3 % ophthalmic solution  Every 4 hours        07/01/23 1736    oxyCODONE -acetaminophen  (PERCOCET) 5-325 MG tablet  Every 4 hours PRN        07/01/23 1736              Tanaya Dunigan K, PA-C 07/01/23 2245    Dean Clarity, MD 07/01/23 2302

## 2023-07-01 NOTE — ED Notes (Signed)

## 2023-07-01 NOTE — ED Notes (Signed)
 Pt does not have her glasses with her, that she normally wears all the time. Visual acuity not performed at this time. L eye tenderness, and upper cheek, L temple, and L ear associated pain.   Able to open eye, red sclera to L eyeball.
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
## 2023-07-03 NOTE — Progress Notes (Deleted)
 Anita Pacheco [facility] 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  We discussed with patient at great length about an OA stability brace with patient having falls previously.  Patient does have significantly an abnormal thigh to calf ratio as well as patient does have instability with valgus and varus force.  I do think a Tru pull lite is also necessary secondary to the lateral translocation of the patella at this moment.  We discussed the importance of some weight loss, continuing to be active though, discussed lower impact exercises in great detail.  Discussed the possibility of injections which patient wants to avoid secondary to severe pain from an outside facility.  Did discuss the possibility of needing a surgical intervention, BMI is over 40 would make her not a candidate at this time.  Encourage weight loss.  Follow-up again 6 to 8 weeks to see how patient is doing.  Total time with patient today 49 minutes with reviewing outside imaging and discussing with patient   Update 07/08/2023 Anita Pacheco is a 53 y.o. female coming in with complaint of R knee pain. Patient states        Past Medical History:  Diagnosis Date   ANXIETY 07/22/2006   ASTHMA 07/22/2006   Breast mass 04/25/2011   Carpal tunnel syndrome 07/22/2006   HERPES ZOSTER 05/28/2010   HIV (human immunodeficiency virus infection) (HCC)    HYPERTENSION 07/22/2006   PERIPHERAL NEUROPATHY 07/03/2008   RLS (restless legs syndrome)    Past Surgical History:  Procedure Laterality Date   CESAREAN SECTION     ENDOVENOUS ABLATION SAPHENOUS VEIN W/ LASER Right 12/22/2017   endovenous laser ablation right greater saphenous vein by Lynwood Collum MD   STAPEDES SURGERY Right    Social  History   Socioeconomic History   Marital status: Single    Spouse name: Not on file   Number of children: 3   Years of education: Not on file   Highest education level: Not on file  Occupational History   Occupation: med tech  Tobacco Use   Smoking status: Never   Smokeless tobacco: Never  Vaping Use   Vaping status: Never Used  Substance and Sexual Activity   Alcohol use: Yes    Comment: holidays   Drug use: No   Sexual activity: Not Currently    Comment: pt. declined condoms  Other Topics Concern   Not on file  Social History Narrative   Not on file   Social Drivers of Health   Financial Resource Strain: Not on file  Food Insecurity: Not on file  Transportation Needs: Not on file  Physical Activity: Not on file  Stress: Not on file  Social Connections: Not on file   Allergies  Allergen Reactions   Hydrocodone Hives and Itching    Excessive vomiting   Enalapril  Rash   Family History  Problem Relation Age of Onset   CAD Mother        cardiomyopathy   GER disease Mother    Hypertension Mother    Pancreatic cancer Father        mets to colon  and liver   CAD Father    Throat cancer Paternal Grandmother    Asthma Daughter    Asthma Daughter    Lymphoma Maternal  Aunt    Ovarian cancer Maternal Aunt    Kidney disease Son      Current Outpatient Medications (Cardiovascular):    amLODipine  (NORVASC ) 10 MG tablet, Take 10 mg by mouth daily.   hydrochlorothiazide (HYDRODIURIL) 25 MG tablet, Take 25 mg by mouth daily.  Current Outpatient Medications (Respiratory):    albuterol  (VENTOLIN  HFA) 108 (90 Base) MCG/ACT inhaler, INHALE 2 PUFFS INTO THE LUNGS EVERY 6 HOURS AS NEEDED FOR WHEEZING OR SHORTNESS OF BREATH   fluticasone  (FLONASE ) 50 MCG/ACT nasal spray, Place 1 spray into both nostrils daily as needed for allergies or rhinitis.   Fluticasone -Salmeterol 113-14 MCG/ACT AEPB, Inhale 1 Dose into the lungs daily.   levocetirizine (XYZAL ) 5 MG tablet, TAKE ONE  TABLET EVERY EVENING   sodium chloride  (OCEAN) 0.65 % nasal spray, Place 1 spray into the nose as needed for congestion.  Current Outpatient Medications (Analgesics):    aspirin EC 81 MG tablet, Take 81 mg by mouth daily. Swallow whole.   meloxicam  (MOBIC ) 15 MG tablet, 1 tab(s) orally once a day for 30 day(s)   oxyCODONE -acetaminophen  (PERCOCET) 5-325 MG tablet, Take 1 tablet by mouth every 4 (four) hours as needed for severe pain (pain score 7-10).  Current Outpatient Medications (Hematological):    ferrous sulfate  300 (60 Fe) MG/5ML syrup, Take 5 mLs (300 mg total) by mouth daily.  Current Outpatient Medications (Other):    diclofenac  sodium (VOLTAREN ) 1 % GEL, Apply 1 application topically 4 (four) times daily.   docusate sodium  (COLACE) 100 MG capsule, Take 1 capsule (100 mg total) by mouth 2 (two) times daily.   dolutegravir-lamiVUDine (DOVATO ) 50-300 MG tablet, Take 1 tablet by mouth daily.   DULoxetine (CYMBALTA) 30 MG capsule, 1 cap(s) orally 2 times a day for 30 days   omeprazole  (PRILOSEC) 40 MG capsule, TAKE 1 CAPSULE(40 MG) BY MOUTH DAILY (Patient taking differently: Take 40 mg by mouth 2 (two) times daily as needed.)   pregabalin  (LYRICA ) 100 MG capsule, Take 100 mg by mouth 2 (two) times daily.   Prenatal Vit-Fe Fumarate-FA (PRENATAL PLUS/IRON ) 27-1 MG TABS, Take 1 tablet by mouth daily.   tobramycin  (TOBREX ) 0.3 % ophthalmic solution, Place 1 drop into the right eye every 4 (four) hours for 10 days.   Reviewed prior external information including notes and imaging from  primary care provider As well as notes that were available from care everywhere and other healthcare systems.  Past medical history, social, surgical and family history all reviewed in electronic medical record.  No pertanent information unless stated regarding to the chief complaint.   Review of Systems:  No headache, visual changes, nausea, vomiting, diarrhea, constipation, dizziness, abdominal pain,  skin rash, fevers, chills, night sweats, weight loss, swollen lymph nodes, body aches, joint swelling, chest pain, shortness of breath, mood changes. POSITIVE muscle aches  Objective  There were no vitals taken for this visit.   General: No apparent distress alert and oriented x3 mood and affect normal, dressed appropriately.  HEENT: Pupils equal, extraocular movements intact  Respiratory: Patient's speak in full sentences and does not appear short of breath  Cardiovascular: No lower extremity edema, non tender, no erythema      Impression and Recommendations:

## 2023-07-08 ENCOUNTER — Ambulatory Visit: Payer: BC Managed Care – PPO | Admitting: Family Medicine

## 2023-08-05 ENCOUNTER — Other Ambulatory Visit: Payer: Self-pay

## 2023-08-05 ENCOUNTER — Other Ambulatory Visit (HOSPITAL_COMMUNITY)
Admission: RE | Admit: 2023-08-05 | Discharge: 2023-08-05 | Disposition: A | Discharge status: Home / Self Care | Payer: BC Managed Care – PPO | Source: Ambulatory Visit | Attending: Infectious Diseases | Admitting: Infectious Diseases

## 2023-08-05 ENCOUNTER — Ambulatory Visit (INDEPENDENT_AMBULATORY_CARE_PROVIDER_SITE_OTHER): Payer: BC Managed Care – PPO | Admitting: Infectious Diseases

## 2023-08-05 ENCOUNTER — Encounter: Payer: Self-pay | Admitting: Infectious Diseases

## 2023-08-05 VITALS — BP 136/89 | HR 82 | Resp 16 | Ht 67.0 in | Wt 270.0 lb

## 2023-08-05 DIAGNOSIS — H43392 Other vitreous opacities, left eye: Secondary | ICD-10-CM

## 2023-08-05 DIAGNOSIS — E6609 Other obesity due to excess calories: Secondary | ICD-10-CM

## 2023-08-05 DIAGNOSIS — Z113 Encounter for screening for infections with a predominantly sexual mode of transmission: Secondary | ICD-10-CM | POA: Diagnosis present

## 2023-08-05 DIAGNOSIS — B20 Human immunodeficiency virus [HIV] disease: Secondary | ICD-10-CM

## 2023-08-05 DIAGNOSIS — M179 Osteoarthritis of knee, unspecified: Secondary | ICD-10-CM | POA: Diagnosis not present

## 2023-08-05 DIAGNOSIS — N912 Amenorrhea, unspecified: Secondary | ICD-10-CM

## 2023-08-05 NOTE — Patient Instructions (Addendum)
 For another check in on your eye - please call for an appointment to be seen.   [facility]  [address] [place], [place] [address] [phone]  General Diet may be helpful - it is focused more on hormone balance and eating for perimenopause / menopause.   [facility] - if you want a referral for that I am happy to help but I suggest talking with Dr. Zachary first to see someone you are comfortable with.   Tylenol  - keep less than 4,000 mg in 24 hours. So try to do max 4 doses a day.

## 2023-08-05 NOTE — Progress Notes (Signed)
 Name: Anita Pacheco  DOB: 16-Sep-1970 MRN: [id number] PCP: Catalina Bare, MD    Subjective:   Chief Complaint  Patient presents with   Follow-up      HPI: Anita Pacheco is a 53 y.o. female with well controlled HIV, working with [facility] since 2008.   Previous Regimens Include: Atripla Complera Dovato  09-2021   Genotype:   Subjective   Discussed the use of AI scribe software for clinical note transcription with the patient, who gave verbal consent to proceed.  History of Present Illness   Anita Pacheco is a 53 year old female who presents with persistent pain and complications from a chemical eye injury acutely, HIV follow up care.   Approximately four weeks ago, she sustained a chemical eye injury after splashing Drano into her eye. Since then, she has experienced persistent symptoms including floaters, photosensitivity, and a sensation of granules in the eye. She has been using allergy eye drops, which result in a grainy discharge. Despite consulting an optometrist, her symptoms persist. She is interested in seeing a new provider for second opinion.   She has a history of fibromyalgia, with widespread pain predominantly on the right side, and difficulty moving due to pain. She has been evaluated by [facility], where further blood tests were recommended to differentiate between fibromyalgia and other conditions. Her current medications include Lyrica , initially at 100 mg four times a day, now reduced to 100 mg twice a day, and Cymbalta, though she is uncertain of its efficacy. Prednisone alleviated a rash and back soreness but caused itching, leading to discontinuation. She takes Tylenol  500 mg, two pills at a time, up to five times a day, exceeding the recommended dosage. She experiences significant pain upon waking, requiring physical effort to move her legs.  She has osteoarthritis, particularly affecting her knee, which requires an offloading brace. The brace  provides relief when properly positioned but frequently slips, causing discomfort. She experiences significant pain, particularly on the right side, which limits her mobility to the extent that walking 10 to 15 feet is challenging.  She has experienced weight gain, partly attributed to her limited mobility and dietary changes due to financial constraints. She aims to reduce her weight to improve her eligibility for knee replacement surgery, which is contingent on reaching a BMI that minimizes surgical risks. She drinks a substantial amount of water daily, approximately three 64-ounce cups, and has a history of heavy menstrual periods, which have become sporadic and shorter in duration. She uses tart cherry capsules and cherries to aid sleep, but reports poor sleep quality, waking after four to five hours due to pain.           08/05/2023    1:57 PM  Depression screen PHQ 2/9  Decreased Interest 0  Down, Depressed, Hopeless 0  PHQ - 2 Score 0    Review of Systems  Constitutional:  Negative for chills and fever.  HENT:  Negative for tinnitus.   Eyes:  Negative for blurred vision and photophobia.  Respiratory:  Negative for cough and sputum production.   Cardiovascular:  Negative for chest pain.  Gastrointestinal:  Negative for diarrhea, nausea and vomiting.  Genitourinary:  Negative for dysuria.  Musculoskeletal:  Positive for joint pain (right).  Skin:  Negative for rash.  Neurological:  Negative for headaches.     Past Medical History:  Diagnosis Date   ANXIETY 07/22/2006   ASTHMA 07/22/2006   Breast mass 04/25/2011   Carpal tunnel syndrome 07/22/2006   HERPES  ZOSTER 05/28/2010   HIV (human immunodeficiency virus infection) (HCC)    HYPERTENSION 07/22/2006   PERIPHERAL NEUROPATHY 07/03/2008   RLS (restless legs syndrome)     Outpatient Medications Prior to Visit  Medication Sig Dispense Refill   albuterol  (VENTOLIN  HFA) 108 (90 Base) MCG/ACT inhaler INHALE 2 PUFFS INTO THE  LUNGS EVERY 6 HOURS AS NEEDED FOR WHEEZING OR SHORTNESS OF BREATH 6.7 g 1   amLODipine  (NORVASC ) 10 MG tablet Take 10 mg by mouth daily.     aspirin EC 81 MG tablet Take 81 mg by mouth daily. Swallow whole.     diclofenac  sodium (VOLTAREN ) 1 % GEL Apply 1 application topically 4 (four) times daily. 100 g 6   docusate sodium  (COLACE) 100 MG capsule Take 1 capsule (100 mg total) by mouth 2 (two) times daily. 180 capsule 3   dolutegravir-lamiVUDine (DOVATO ) 50-300 MG tablet Take 1 tablet by mouth daily. 30 tablet 11   ferrous sulfate  300 (60 Fe) MG/5ML syrup Take 5 mLs (300 mg total) by mouth daily. 150 mL 3   fluticasone  (FLONASE ) 50 MCG/ACT nasal spray Place 1 spray into both nostrils daily as needed for allergies or rhinitis. 16 g 3   hydrochlorothiazide (HYDRODIURIL) 25 MG tablet Take 25 mg by mouth daily.     levocetirizine (XYZAL ) 5 MG tablet TAKE ONE TABLET EVERY EVENING 31 tablet 0   meloxicam  (MOBIC ) 15 MG tablet 1 tab(s) orally once a day for 30 day(s)     omeprazole  (PRILOSEC) 40 MG capsule TAKE 1 CAPSULE(40 MG) BY MOUTH DAILY (Patient taking differently: Take 40 mg by mouth 2 (two) times daily as needed.) 30 capsule 3   oxyCODONE -acetaminophen  (PERCOCET) 5-325 MG tablet Take 1 tablet by mouth every 4 (four) hours as needed for severe pain (pain score 7-10). 6 tablet 0   pregabalin  (LYRICA ) 100 MG capsule Take 100 mg by mouth 2 (two) times daily.     Prenatal Vit-Fe Fumarate-FA (PRENATAL PLUS/IRON ) 27-1 MG TABS Take 1 tablet by mouth daily. 180 each 0   sodium chloride  (OCEAN) 0.65 % nasal spray Place 1 spray into the nose as needed for congestion. 15 mL 12   DULoxetine (CYMBALTA) 30 MG capsule 1 cap(s) orally 2 times a day for 30 days     Fluticasone -Salmeterol 113-14 MCG/ACT AEPB Inhale 1 Dose into the lungs daily. 1 each 3   No facility-administered medications prior to visit.     Allergies  Allergen Reactions   Hydrocodone Hives and Itching    Excessive vomiting   Enalapril  Rash     Social History   Tobacco Use   Smoking status: Never   Smokeless tobacco: Never  Vaping Use   Vaping status: Never Used  Substance Use Topics   Alcohol use: Yes    Comment: holidays   Drug use: No     Social History   Substance and Sexual Activity  Sexual Activity Not Currently   Comment: pt. declined condoms     Objective    Objective:   Vitals:   08/05/23 1355  BP: 136/89  Pulse: 82  Resp: 16  Weight: 270 lb (122.5 kg)  Height: 5' 7 (1.702 m)    Body mass index is 42.29 kg/m.  Physical Exam Constitutional:      Appearance: Normal appearance. She is not ill-appearing.  HENT:     Mouth/Throat:     Mouth: Mucous membranes are moist.     Pharynx: Oropharynx is clear.  Eyes:  General: No scleral icterus. Cardiovascular:     Rate and Rhythm: Normal rate.  Pulmonary:     Effort: Pulmonary effort is normal.  Neurological:     Mental Status: She is oriented to person, place, and time.  Psychiatric:        Mood and Affect: Mood normal.        Thought Content: Thought content normal.     Lab Results Lab Results  Component Value Date   WBC 5.9 08/05/2023   HGB 11.1 (L) 08/05/2023   HCT 35.8 08/05/2023   MCV 85.4 08/05/2023   PLT 238 08/05/2023    Lab Results  Component Value Date   CREATININE 0.78 08/05/2023   BUN 15 08/05/2023   NA 139 08/05/2023   K 3.5 08/05/2023   CL 104 08/05/2023   CO2 27 08/05/2023    Lab Results  Component Value Date   ALT 10 08/05/2023   AST 15 08/05/2023   ALKPHOS 58 12/03/2016   BILITOT 0.4 08/05/2023    Lab Results  Component Value Date   CHOL 225 (H) 08/25/2022   HDL 51 08/25/2022   LDLCALC 140 (H) 08/25/2022   TRIG 203 (H) 08/25/2022   CHOLHDL 4.4 08/25/2022   HIV 1 RNA Quant  Date Value  08/05/2023 49 Copies/mL (H)  08/25/2022 <20 DETECTED copies/mL (A)  08/23/2021 <20 DETECTED copies/mL (A)   CD4 T Cell Abs (/uL)  Date Value  08/05/2023 876  08/25/2022 847  02/14/2020 493        Assessment & Plan:  Assessment and Plan    HIV -  Well controlled on once daily Dovato . No drug interactions noted.  Check CD4 and VL today. Lipids, chemistry and cbc have been collected within 88m from other providers and evaluated personally.  -check CD4/VL  -Review other pertinent labs from other providers -Continue dovato  -FU in 37m    Chemical Eye Injury - Persistent symptoms of floaters, photosensitivity, and foreign body sensation 4 weeks after Drano splash. Current treatment with allergy eye drops. Patient is dissatisfied with current optometrist's assessment. -Refer to [facility] for a second opinion.   Osteoarthritis of the Knee - Chronic Pain -  Chronic widespread pain, unrelieved with prednisone. Has seen rheum - does not seem to be inflammatory arthritis. ESR/CRP are elevated > 100 for unclear reasons.  Chronic knee pain, currently managed with a brace but has trouble keeping in place. Patient is a candidate for knee replacement but requires weight loss for surgical clearance. I wonder if perimenopause making symptoms of joint pains worse acutely over the last few years. She is 52 and still cycling normally but quality of menses have changed.  -Check progesterone , estradiol , testosterone levels.  -Consider [facility] to [facility] for weight loss support. Recommend she talk with her PCP first for other options.  -Recommend patient explore the Electronic data systems. -Recommend patient reduce Tylenol  intake to no more than 4 doses of 2 extra strength tablets per day. -Encourage patient to pursue weight loss to achieve a weight under 250lbs for surgical clearance. -Consider referral to [facility] for weight loss support. -Recommend patient explore the Electronic data systems. -Encourage low-impact exercise such as water aerobics. Candidate for knee replacement but requires weight loss for surgical clearance.  -discussed tylenol  limit to  not exceed 4g daily.      No orders of the defined types were placed in this encounter.  Orders Placed This Encounter  Procedures   Estradiol   FSH   Progesterone    HIV 1 RNA quant-no reflex-bld    Return in about 4 months (around 12/03/2023).    Corean Fireman, MSN, NP-C [facility] Pager: [phone] Office: [phone]  08/13/23  10:52 AM

## 2023-08-06 LAB — URINE CYTOLOGY ANCILLARY ONLY
Chlamydia: NEGATIVE
Comment: NEGATIVE
Comment: NORMAL
Neisseria Gonorrhea: NEGATIVE

## 2023-08-06 LAB — T-HELPER CELL (CD4) - (RCID CLINIC ONLY)
CD4 % Helper T Cell: 39 % (ref 33–65)
CD4 T Cell Abs: 876 /uL (ref 400–1790)

## 2023-08-07 LAB — COMPLETE METABOLIC PANEL WITH GFR
AG Ratio: 1.2 (calc) (ref 1.0–2.5)
ALT: 10 U/L (ref 6–29)
AST: 15 U/L (ref 10–35)
Albumin: 4.1 g/dL (ref 3.6–5.1)
Alkaline phosphatase (APISO): 66 U/L (ref 37–153)
BUN: 15 mg/dL (ref 7–25)
CO2: 27 mmol/L (ref 20–32)
Calcium: 8.9 mg/dL (ref 8.6–10.4)
Chloride: 104 mmol/L (ref 98–110)
Creat: 0.78 mg/dL (ref 0.50–1.03)
Globulin: 3.4 g/dL (ref 1.9–3.7)
Glucose, Bld: 75 mg/dL (ref 65–99)
Potassium: 3.5 mmol/L (ref 3.5–5.3)
Sodium: 139 mmol/L (ref 135–146)
Total Bilirubin: 0.4 mg/dL (ref 0.2–1.2)
Total Protein: 7.5 g/dL (ref 6.1–8.1)
eGFR: 91 mL/min/{1.73_m2} (ref >=60)

## 2023-08-07 LAB — CBC WITH DIFFERENTIAL/PLATELET
Absolute Lymphocytes: 2496 {cells}/uL (ref 850–3900)
Absolute Monocytes: 643 {cells}/uL (ref 200–950)
Basophils Absolute: 18 {cells}/uL (ref 0–200)
Basophils Relative: 0.3 %
Eosinophils Absolute: 41 {cells}/uL (ref 15–500)
Eosinophils Relative: 0.7 %
HCT: 35.8 % (ref 35.0–45.0)
Hemoglobin: 11.1 g/dL — ABNORMAL LOW (ref 11.7–15.5)
MCH: 26.5 pg — ABNORMAL LOW (ref 27.0–33.0)
MCHC: 31 g/dL — ABNORMAL LOW (ref 32.0–36.0)
MCV: 85.4 fL (ref 80.0–100.0)
MPV: 10.9 fL (ref 7.5–12.5)
Monocytes Relative: 10.9 %
Neutro Abs: 2702 {cells}/uL (ref 1500–7800)
Neutrophils Relative %: 45.8 %
Platelets: 238 10*3/uL (ref 140–400)
RBC: 4.19 10*6/uL (ref 3.80–5.10)
RDW: 15.8 % — ABNORMAL HIGH (ref 11.0–15.0)
Total Lymphocyte: 42.3 %
WBC: 5.9 10*3/uL (ref 3.8–10.8)

## 2023-08-07 LAB — PROGESTERONE: Progesterone: 3.3 ng/mL

## 2023-08-07 LAB — RPR: RPR Ser Ql: NONREACTIVE

## 2023-08-07 LAB — ESTRADIOL: Estradiol: 53 pg/mL

## 2023-08-07 LAB — FOLLICLE STIMULATING HORMONE: FSH: 16.3 m[IU]/mL

## 2023-08-07 LAB — HIV-1 RNA QUANT-NO REFLEX-BLD
HIV 1 RNA Quant: 49 {copies}/mL — ABNORMAL HIGH
HIV-1 RNA Quant, Log: 1.69 {Log} — ABNORMAL HIGH

## 2023-08-11 NOTE — Progress Notes (Deleted)
 [facility] [address] [place] [address] Phone: [phone] Subjective:    I'm seeing this patient by the request  of:  Osei-Bonsu, Greggory Stallion, MD  CC: Knee pain follow-up  [id number]  04/22/2023 Significant degenerative right patellofemoral arthritis and lateral compartment arthritis.  We discussed with patient at great length about an OA stability brace with patient having falls previously.  Patient does have significantly an abnormal thigh to calf ratio as well as patient does have instability with valgus and varus force.  I do think a Tru pull lite is also necessary secondary to the lateral translocation of the patella at this moment.  We discussed the importance of some weight loss, continuing to be active though, discussed lower impact exercises in great detail.  Discussed the possibility of injections which patient wants to avoid secondary to severe pain from an outside facility.  Did discuss the possibility of needing a surgical intervention, BMI is over 40 would make her not a candidate at this time.  Encourage weight loss.  Follow-up again 6 to 8 weeks to see how patient is doing.  Total time with patient today 49 minutes with reviewing outside imaging and discussing with patient   Update 08/12/2023 Cambell Rickenbach is a 53 y.o. female coming in with past medical history of HIV with complaint of R knee pain.  Patient was found to have severe patellofemoral arthritis.  We discussed the importance of weight loss as well as secondary to the instability discussing bracing.  At last exam as well we did discuss the possibility patient states   Patient did see infectious disease and they have referred her to [facility] to help her with weight loss.   Patient is most recent HIV titers did show elevation of the HIV, continued anemia  Past Medical History:  Diagnosis Date   ANXIETY 07/22/2006   ASTHMA 07/22/2006   Breast mass 04/25/2011    Carpal tunnel syndrome 07/22/2006   HERPES ZOSTER 05/28/2010   HIV (human immunodeficiency virus infection) (HCC)    HYPERTENSION 07/22/2006   PERIPHERAL NEUROPATHY 07/03/2008   RLS (restless legs syndrome)    Past Surgical History:  Procedure Laterality Date   CESAREAN SECTION     ENDOVENOUS ABLATION SAPHENOUS VEIN W/ LASER Right 12/22/2017   endovenous laser ablation right greater saphenous vein by Josephina Gip MD   STAPEDES SURGERY Right    Social History   Socioeconomic History   Marital status: Single    Spouse name: Not on file   Number of children: 3   Years of education: Not on file   Highest education level: Not on file  Occupational History   Occupation: med tech  Tobacco Use   Smoking status: Never   Smokeless tobacco: Never  Vaping Use   Vaping status: Never Used  Substance and Sexual Activity   Alcohol use: Yes    Comment: holidays   Drug use: No   Sexual activity: Not Currently    Comment: pt. declined condoms  Other Topics Concern   Not on file  Social History Narrative   Not on file   Social Drivers of Health   Financial Resource Strain: Not on file  Food Insecurity: Not on file  Transportation Needs: Not on file  Physical Activity: Not on file  Stress: Not on file  Social Connections: Not on file   Allergies  Allergen Reactions   Hydrocodone Hives and Itching    Excessive vomiting   Enalapril  Rash   Family History  Problem Relation Age of Onset   CAD Mother        cardiomyopathy   GER disease Mother    Hypertension Mother    Pancreatic cancer Father        mets to colon  and liver   CAD Father    Throat cancer Paternal Grandmother    Asthma Daughter    Asthma Daughter    Lymphoma Maternal Aunt    Ovarian cancer Maternal Aunt    Kidney disease Son      Current Outpatient Medications (Cardiovascular):    amLODipine (NORVASC) 10 MG tablet, Take 10 mg by mouth daily.   hydrochlorothiazide (HYDRODIURIL) 25 MG tablet, Take 25 mg  by mouth daily.  Current Outpatient Medications (Respiratory):    albuterol (VENTOLIN HFA) 108 (90 Base) MCG/ACT inhaler, INHALE 2 PUFFS INTO THE LUNGS EVERY 6 HOURS AS NEEDED FOR WHEEZING OR SHORTNESS OF BREATH   fluticasone (FLONASE) 50 MCG/ACT nasal spray, Place 1 spray into both nostrils daily as needed for allergies or rhinitis.   Fluticasone-Salmeterol 113-14 MCG/ACT AEPB, Inhale 1 Dose into the lungs daily.   levocetirizine (XYZAL) 5 MG tablet, TAKE ONE TABLET EVERY EVENING   sodium chloride (OCEAN) 0.65 % nasal spray, Place 1 spray into the nose as needed for congestion.  Current Outpatient Medications (Analgesics):    aspirin EC 81 MG tablet, Take 81 mg by mouth daily. Swallow whole.   meloxicam (MOBIC) 15 MG tablet, 1 tab(s) orally once a day for 30 day(s)   oxyCODONE-acetaminophen (PERCOCET) 5-325 MG tablet, Take 1 tablet by mouth every 4 (four) hours as needed for severe pain (pain score 7-10).  Current Outpatient Medications (Hematological):    ferrous sulfate 300 (60 Fe) MG/5ML syrup, Take 5 mLs (300 mg total) by mouth daily.  Current Outpatient Medications (Other):    diclofenac sodium (VOLTAREN) 1 % GEL, Apply 1 application topically 4 (four) times daily.   docusate sodium (COLACE) 100 MG capsule, Take 1 capsule (100 mg total) by mouth 2 (two) times daily.   dolutegravir-lamiVUDine (DOVATO) 50-300 MG tablet, Take 1 tablet by mouth daily.   DULoxetine (CYMBALTA) 30 MG capsule, 1 cap(s) orally 2 times a day for 30 days   omeprazole (PRILOSEC) 40 MG capsule, TAKE 1 CAPSULE(40 MG) BY MOUTH DAILY (Patient taking differently: Take 40 mg by mouth 2 (two) times daily as needed.)   pregabalin (LYRICA) 100 MG capsule, Take 100 mg by mouth 2 (two) times daily.   Prenatal Vit-Fe Fumarate-FA (PRENATAL PLUS/IRON) 27-1 MG TABS, Take 1 tablet by mouth daily.   Reviewed prior external information including notes and imaging from  primary care provider As well as notes that were available  from care everywhere and other healthcare systems.  Past medical history, social, surgical and family history all reviewed in electronic medical record.  No pertanent information unless stated regarding to the chief complaint.   Review of Systems:  No headache, visual changes, nausea, vomiting, diarrhea, constipation, dizziness, abdominal pain, skin rash, fevers, chills, night sweats, weight loss, swollen lymph nodes, body aches, joint swelling, chest pain, shortness of breath, mood changes. POSITIVE muscle aches  Objective  There were no vitals taken for this visit.   General: No apparent distress alert and oriented x3 mood and affect normal, dressed appropriately.  HEENT: Pupils equal, extraocular movements intact  Respiratory: Patient's speak in full sentences and does not appear short of breath  Cardiovascular: No lower extremity edema, non tender, no erythema  Impression and Recommendations:     The above documentation has been reviewed and is accurate and complete Judi Saa, DO

## 2023-08-12 ENCOUNTER — Ambulatory Visit: Payer: BC Managed Care – PPO | Admitting: Family Medicine

## 2023-08-13 NOTE — Progress Notes (Unsigned)
[facility] [address] [place] [address] Phone: [phone] Subjective:   Anita Pacheco, am serving as a scribe for Dr. Antoine Primas.  I'm seeing this patient by the request  of:  Osei-Bonsu, Greggory Stallion, MD  CC: knee pain follow up   [id number]  04/22/2023 Significant degenerative right patellofemoral arthritis and lateral compartment arthritis.  We discussed with patient at great length about an OA stability brace with patient having falls previously.  Patient does have significantly an abnormal thigh to calf ratio as well as patient does have instability with valgus and varus force.  I do think a Tru pull lite is also necessary secondary to the lateral translocation of the patella at this moment.  We discussed the importance of some weight loss, continuing to be active though, discussed lower impact exercises in great detail.  Discussed the possibility of injections which patient wants to avoid secondary to severe pain from an outside facility.  Did discuss the possibility of needing a surgical intervention, BMI is over 40 would make her not a candidate at this time.  Encourage weight loss.  Follow-up again 6 to 8 weeks to see how patient is doing.  Total time with patient today 49 minutes with reviewing outside imaging and discussing with patient      Update 08/18/2023 Anita Pacheco is a 53 y.o. female coming in with complaint of R knee pain. Patient states increased pain. Feels like its locking. Feels better with brace, but not staying on all the time. Having trouble especially after taking breaks.      Past Medical History:  Diagnosis Date   ANXIETY 07/22/2006   ASTHMA 07/22/2006   Breast mass 04/25/2011   Carpal tunnel syndrome 07/22/2006   HERPES ZOSTER 05/28/2010   HIV (human immunodeficiency virus infection) (HCC)    HYPERTENSION 07/22/2006   PERIPHERAL NEUROPATHY 07/03/2008   RLS (restless legs syndrome)    Past Surgical  History:  Procedure Laterality Date   CESAREAN SECTION     ENDOVENOUS ABLATION SAPHENOUS VEIN W/ LASER Right 12/22/2017   endovenous laser ablation right greater saphenous vein by Josephina Gip MD   STAPEDES SURGERY Right    Social History   Socioeconomic History   Marital status: Single    Spouse name: Not on file   Number of children: 3   Years of education: Not on file   Highest education level: Not on file  Occupational History   Occupation: med tech  Tobacco Use   Smoking status: Never   Smokeless tobacco: Never  Vaping Use   Vaping status: Never Used  Substance and Sexual Activity   Alcohol use: Yes    Comment: holidays   Drug use: No   Sexual activity: Not Currently    Comment: pt. declined condoms  Other Topics Concern   Not on file  Social History Narrative   Not on file   Social Drivers of Health   Financial Resource Strain: Not on file  Food Insecurity: Not on file  Transportation Needs: Not on file  Physical Activity: Not on file  Stress: Not on file  Social Connections: Not on file   Allergies  Allergen Reactions   Hydrocodone Hives and Itching    Excessive vomiting   Enalapril Rash   Family History  Problem Relation Age of Onset   CAD Mother        cardiomyopathy   GER disease Mother    Hypertension Mother    Pancreatic cancer  Father        mets to colon  and liver   CAD Father    Throat cancer Paternal Grandmother    Asthma Daughter    Asthma Daughter    Lymphoma Maternal Aunt    Ovarian cancer Maternal Aunt    Kidney disease Son      Current Outpatient Medications (Cardiovascular):    amLODipine (NORVASC) 10 MG tablet, Take 10 mg by mouth daily.   hydrochlorothiazide (HYDRODIURIL) 25 MG tablet, Take 25 mg by mouth daily.  Current Outpatient Medications (Respiratory):    albuterol (VENTOLIN HFA) 108 (90 Base) MCG/ACT inhaler, INHALE 2 PUFFS INTO THE LUNGS EVERY 6 HOURS AS NEEDED FOR WHEEZING OR SHORTNESS OF BREATH   fluticasone  (FLONASE) 50 MCG/ACT nasal spray, Place 1 spray into both nostrils daily as needed for allergies or rhinitis.   Fluticasone-Salmeterol 113-14 MCG/ACT AEPB, Inhale 1 Dose into the lungs daily.   levocetirizine (XYZAL) 5 MG tablet, TAKE ONE TABLET EVERY EVENING   sodium chloride (OCEAN) 0.65 % nasal spray, Place 1 spray into the nose as needed for congestion.  Current Outpatient Medications (Analgesics):    aspirin EC 81 MG tablet, Take 81 mg by mouth daily. Swallow whole.   meloxicam (MOBIC) 15 MG tablet, 1 tab(s) orally once a day for 30 day(s)   oxyCODONE-acetaminophen (PERCOCET) 5-325 MG tablet, Take 1 tablet by mouth every 4 (four) hours as needed for severe pain (pain score 7-10).  Current Outpatient Medications (Hematological):    ferrous sulfate 300 (60 Fe) MG/5ML syrup, Take 5 mLs (300 mg total) by mouth daily.  Current Outpatient Medications (Other):    diclofenac sodium (VOLTAREN) 1 % GEL, Apply 1 application topically 4 (four) times daily.   docusate sodium (COLACE) 100 MG capsule, Take 1 capsule (100 mg total) by mouth 2 (two) times daily.   dolutegravir-lamiVUDine (DOVATO) 50-300 MG tablet, Take 1 tablet by mouth daily.   DULoxetine (CYMBALTA) 30 MG capsule, 1 cap(s) orally 2 times a day for 30 days   omeprazole (PRILOSEC) 40 MG capsule, TAKE 1 CAPSULE(40 MG) BY MOUTH DAILY (Patient taking differently: Take 40 mg by mouth 2 (two) times daily as needed.)   pregabalin (LYRICA) 100 MG capsule, Take 100 mg by mouth 2 (two) times daily.   Prenatal Vit-Fe Fumarate-FA (PRENATAL PLUS/IRON) 27-1 MG TABS, Take 1 tablet by mouth daily.   Reviewed prior external information including notes and imaging from  primary care provider As well as notes that were available from care everywhere and other healthcare systems.  Past medical history, social, surgical and family history all reviewed in electronic medical record.  No pertanent information unless stated regarding to the chief complaint.    Review of Systems:  No headache, visual changes, nausea, vomiting, diarrhea, constipation, dizziness, abdominal pain, skin rash, fevers, chills, night sweats, weight loss, swollen lymph nodes, body aches, joint swelling, chest pain, shortness of breath, mood changes. POSITIVE muscle aches  Objective  Blood pressure 138/86, pulse 90, height 5\' 7"  (1.702 m), weight 271 lb (122.9 kg), SpO2 97%.   General: No apparent distress alert and oriented x3 mood and affect normal, dressed appropriately.  HEENT: Pupils equal, extraocular movements intact  Respiratory: Patient's speak in full sentences and does not appear short of breath  Cardiovascular: No lower extremity edema, non tender, no erythema  Right knee exam does have arthritic changes noted.  Trace effusion noted.  After informed written and verbal consent, patient was seated on exam table. Right knee was  prepped with alcohol swab and utilizing anterolateral approach, patient's right knee space was injected with 4:1  marcaine 0.5%: Kenalog 40mg /dL. Patient tolerated the procedure well without immediate complications.    Impression and Recommendations:    The above documentation has been reviewed and is accurate and complete Judi Saa, DO

## 2023-08-18 ENCOUNTER — Encounter: Payer: Self-pay | Admitting: Family Medicine

## 2023-08-18 ENCOUNTER — Ambulatory Visit: Payer: BC Managed Care – PPO | Admitting: Family Medicine

## 2023-08-18 VITALS — BP 138/86 | HR 90 | Ht 67.0 in | Wt 271.0 lb

## 2023-08-18 DIAGNOSIS — M1711 Unilateral primary osteoarthritis, right knee: Secondary | ICD-10-CM | POA: Diagnosis not present

## 2023-08-18 NOTE — Assessment & Plan Note (Signed)
Injection today worsening symptoms.  Discussed icing regimen and home exercises, which activities to do and which ones to avoid.  Increase activity slowly.  Follow-up again in 6 to 8 weeks could be a viscosupplementation candidate.

## 2023-08-18 NOTE — Patient Instructions (Addendum)
Injected R knee today Approval for Gel See you again in 2-3 months

## 2023-08-21 ENCOUNTER — Telehealth: Payer: Self-pay

## 2023-08-21 NOTE — Telephone Encounter (Signed)
Patient is scheduled for 10/21/23  Synvisc one authorized for right knee Reference number [id number] 08/20/23-02/16/24

## 2023-08-24 NOTE — Telephone Encounter (Signed)
 Noted.

## 2023-10-20 NOTE — Progress Notes (Deleted)
 [name] [address] [place] [address] Phone: [phone] Subjective:    I'm seeing this patient by the request  of:  Tretha Fu, MD  CC:   [id number]  08/18/2023 Injection today worsening symptoms.  Discussed icing regimen and home exercises, which activities to do and which ones to avoid.  Increase activity slowly.  Follow-up again in 6 to 8 weeks could be a viscosupplementation candidate.      Update 10/21/2023 Anita Pacheco is a 53 y.o. female coming in with complaint of R knee pain. Patient states       Past Medical History:  Diagnosis Date   ANXIETY 07/22/2006   ASTHMA 07/22/2006   Breast mass 04/25/2011   Carpal tunnel syndrome 07/22/2006   HERPES ZOSTER 05/28/2010   HIV (human immunodeficiency virus infection) (HCC)    HYPERTENSION 07/22/2006   PERIPHERAL NEUROPATHY 07/03/2008   RLS (restless legs syndrome)    Past Surgical History:  Procedure Laterality Date   CESAREAN SECTION     ENDOVENOUS ABLATION SAPHENOUS VEIN W/ LASER Right 12/22/2017   endovenous laser ablation right greater saphenous vein by Merced Stair MD   STAPEDES SURGERY Right    Social History   Socioeconomic History   Marital status: Single    Spouse name: Not on file   Number of children: 3   Years of education: Not on file   Highest education level: Not on file  Occupational History   Occupation: med tech  Tobacco Use   Smoking status: Never   Smokeless tobacco: Never  Vaping Use   Vaping status: Never Used  Substance and Sexual Activity   Alcohol use: Yes    Comment: holidays   Drug use: No   Sexual activity: Not Currently    Comment: pt. declined condoms  Other Topics Concern   Not on file  Social History Narrative   Not on file   Social Drivers of Health   Financial Resource Strain: Not on file  Food Insecurity: Not on file  Transportation Needs: Not on file  Physical Activity: Not on file  Stress: Not on file   Social Connections: Not on file   Allergies  Allergen Reactions   Hydrocodone Hives and Itching    Excessive vomiting   Enalapril  Rash   Family History  Problem Relation Age of Onset   CAD Mother        cardiomyopathy   GER disease Mother    Hypertension Mother    Pancreatic cancer Father        mets to colon  and liver   CAD Father    Throat cancer Paternal Grandmother    Asthma Daughter    Asthma Daughter    Lymphoma Maternal Aunt    Ovarian cancer Maternal Aunt    Kidney disease Son      Current Outpatient Medications (Cardiovascular):    amLODipine  (NORVASC ) 10 MG tablet, Take 10 mg by mouth daily.   hydrochlorothiazide (HYDRODIURIL) 25 MG tablet, Take 25 mg by mouth daily.  Current Outpatient Medications (Respiratory):    albuterol  (VENTOLIN  HFA) 108 (90 Base) MCG/ACT inhaler, INHALE 2 PUFFS INTO THE LUNGS EVERY 6 HOURS AS NEEDED FOR WHEEZING OR SHORTNESS OF BREATH   fluticasone  (FLONASE ) 50 MCG/ACT nasal spray, Place 1 spray into both nostrils daily as needed for allergies or rhinitis.   Fluticasone -Salmeterol 113-14 MCG/ACT AEPB, Inhale 1 Dose into the lungs daily.   levocetirizine (XYZAL ) 5 MG tablet, TAKE ONE TABLET EVERY EVENING  sodium chloride  (OCEAN) 0.65 % nasal spray, Place 1 spray into the nose as needed for congestion.  Current Outpatient Medications (Analgesics):    aspirin EC 81 MG tablet, Take 81 mg by mouth daily. Swallow whole.   meloxicam  (MOBIC ) 15 MG tablet, 1 tab(s) orally once a day for 30 day(s)   oxyCODONE -acetaminophen  (PERCOCET) 5-325 MG tablet, Take 1 tablet by mouth every 4 (four) hours as needed for severe pain (pain score 7-10).  Current Outpatient Medications (Hematological):    ferrous sulfate  300 (60 Fe) MG/5ML syrup, Take 5 mLs (300 mg total) by mouth daily.  Current Outpatient Medications (Other):    diclofenac  sodium (VOLTAREN ) 1 % GEL, Apply 1 application topically 4 (four) times daily.   docusate sodium  (COLACE) 100 MG  capsule, Take 1 capsule (100 mg total) by mouth 2 (two) times daily.   dolutegravir-lamiVUDine (DOVATO ) 50-300 MG tablet, Take 1 tablet by mouth daily.   DULoxetine (CYMBALTA) 30 MG capsule, 1 cap(s) orally 2 times a day for 30 days   omeprazole  (PRILOSEC) 40 MG capsule, TAKE 1 CAPSULE(40 MG) BY MOUTH DAILY (Patient taking differently: Take 40 mg by mouth 2 (two) times daily as needed.)   pregabalin  (LYRICA ) 100 MG capsule, Take 100 mg by mouth 2 (two) times daily.   Prenatal Vit-Fe Fumarate-FA (PRENATAL PLUS/IRON ) 27-1 MG TABS, Take 1 tablet by mouth daily.   Reviewed prior external information including notes and imaging from  primary care provider As well as notes that were available from care everywhere and other healthcare systems.  Past medical history, social, surgical and family history all reviewed in electronic medical record.  No pertanent information unless stated regarding to the chief complaint.   Review of Systems:  No headache, visual changes, nausea, vomiting, diarrhea, constipation, dizziness, abdominal pain, skin rash, fevers, chills, night sweats, weight loss, swollen lymph nodes, body aches, joint swelling, chest pain, shortness of breath, mood changes. POSITIVE muscle aches  Objective  There were no vitals taken for this visit.   General: No apparent distress alert and oriented x3 mood and affect normal, dressed appropriately.  HEENT: Pupils equal, extraocular movements intact  Respiratory: Patient's speak in full sentences and does not appear short of breath  Cardiovascular: No lower extremity edema, non tender, no erythema      Impression and Recommendations:

## 2023-10-21 ENCOUNTER — Ambulatory Visit: Payer: BC Managed Care – PPO | Admitting: Family Medicine

## 2023-11-25 NOTE — Progress Notes (Unsigned)
 [name] [address] [place] [address] Phone: [phone] Subjective:   IBryan Caprio, am serving as a scribe for Dr. Ronnell Coins.  I'm seeing this patient by the request  of:  Osei-Bonsu, Caretha Chapel, MD  CC: Right knee pain  [id number]  08/18/2023 Injection today worsening symptoms.  Discussed icing regimen and home exercises, which activities to do and which ones to avoid.  Increase activity slowly.  Follow-up again in 6 to 8 weeks could be a viscosupplementation candidate.     Updated 11/27/2023 Monserrat Salvador is a 53 y.o. female coming in with complaint of R knee pain. Pain management ([facility]) not working with her goals of getting back to work. Needs different referral. Body can only move about 4 hours during the day, but then body starts to seize up mostly R side hands, foot, and leg.      Past Medical History:  Diagnosis Date   ANXIETY 07/22/2006   ASTHMA 07/22/2006   Breast mass 04/25/2011   Carpal tunnel syndrome 07/22/2006   HERPES ZOSTER 05/28/2010   HIV (human immunodeficiency virus infection) (HCC)    HYPERTENSION 07/22/2006   PERIPHERAL NEUROPATHY 07/03/2008   RLS (restless legs syndrome)    Past Surgical History:  Procedure Laterality Date   CESAREAN SECTION     ENDOVENOUS ABLATION SAPHENOUS VEIN W/ LASER Right 12/22/2017   endovenous laser ablation right greater saphenous vein by Merced Stair MD   STAPEDES SURGERY Right    Social History   Socioeconomic History   Marital status: Single    Spouse name: Not on file   Number of children: 3   Years of education: Not on file   Highest education level: Not on file  Occupational History   Occupation: med tech  Tobacco Use   Smoking status: Never   Smokeless tobacco: Never  Vaping Use   Vaping status: Never Used  Substance and Sexual Activity   Alcohol use: Yes    Comment: holidays   Drug use: No   Sexual activity: Not Currently    Comment: pt.  declined condoms  Other Topics Concern   Not on file  Social History Narrative   Not on file   Social Drivers of Health   Financial Resource Strain: Not on file  Food Insecurity: Not on file  Transportation Needs: Not on file  Physical Activity: Not on file  Stress: Not on file  Social Connections: Not on file   Allergies  Allergen Reactions   Hydrocodone Hives and Itching    Excessive vomiting   Enalapril  Rash   Family History  Problem Relation Age of Onset   CAD Mother        cardiomyopathy   GER disease Mother    Hypertension Mother    Pancreatic cancer Father        mets to colon  and liver   CAD Father    Throat cancer Paternal Grandmother    Asthma Daughter    Asthma Daughter    Lymphoma Maternal Aunt    Ovarian cancer Maternal Aunt    Kidney disease Son      Current Outpatient Medications (Cardiovascular):    amLODipine  (NORVASC ) 10 MG tablet, Take 10 mg by mouth daily.   hydrochlorothiazide (HYDRODIURIL) 25 MG tablet, Take 25 mg by mouth daily.  Current Outpatient Medications (Respiratory):    albuterol  (VENTOLIN  HFA) 108 (90 Base) MCG/ACT inhaler, INHALE 2 PUFFS INTO THE LUNGS EVERY 6 HOURS AS NEEDED FOR  WHEEZING OR SHORTNESS OF BREATH   fluticasone  (FLONASE ) 50 MCG/ACT nasal spray, Place 1 spray into both nostrils daily as needed for allergies or rhinitis.   Fluticasone -Salmeterol 113-14 MCG/ACT AEPB, Inhale 1 Dose into the lungs daily.   levocetirizine (XYZAL ) 5 MG tablet, TAKE ONE TABLET EVERY EVENING   sodium chloride  (OCEAN) 0.65 % nasal spray, Place 1 spray into the nose as needed for congestion.  Current Outpatient Medications (Analgesics):    aspirin EC 81 MG tablet, Take 81 mg by mouth daily. Swallow whole.   meloxicam  (MOBIC ) 15 MG tablet, 1 tab(s) orally once a day for 30 day(s)   oxyCODONE -acetaminophen  (PERCOCET) 5-325 MG tablet, Take 1 tablet by mouth every 4 (four) hours as needed for severe pain (pain score 7-10).  Current Outpatient  Medications (Hematological):    ferrous sulfate  300 (60 Fe) MG/5ML syrup, Take 5 mLs (300 mg total) by mouth daily.  Current Outpatient Medications (Other):    diclofenac  sodium (VOLTAREN ) 1 % GEL, Apply 1 application topically 4 (four) times daily.   docusate sodium  (COLACE) 100 MG capsule, Take 1 capsule (100 mg total) by mouth 2 (two) times daily.   dolutegravir-lamiVUDine (DOVATO ) 50-300 MG tablet, Take 1 tablet by mouth daily.   DULoxetine (CYMBALTA) 30 MG capsule, 1 cap(s) orally 2 times a day for 30 days   omeprazole  (PRILOSEC) 40 MG capsule, TAKE 1 CAPSULE(40 MG) BY MOUTH DAILY (Patient taking differently: Take 40 mg by mouth 2 (two) times daily as needed.)   pregabalin  (LYRICA ) 100 MG capsule, Take 100 mg by mouth 2 (two) times daily.   Prenatal Vit-Fe Fumarate-FA (PRENATAL PLUS/IRON ) 27-1 MG TABS, Take 1 tablet by mouth daily.   Reviewed prior external information including notes and imaging from  primary care provider As well as notes that were available from care everywhere and other healthcare systems.  Past medical history, social, surgical and family history all reviewed in electronic medical record.  No pertanent information unless stated regarding to the chief complaint.   Review of Systems:  No headache, visual changes, nausea, vomiting, diarrhea, constipation, dizziness, abdominal pain, skin rash, fevers, chills, night sweats, weight loss, swollen lymph nodes, body aches, joint swelling, chest pain, shortness of breath, mood changes. POSITIVE muscle aches  Objective  Blood pressure (!) 138/96, pulse 95, height 5\' 7"  (1.702 m), SpO2 98%.   General: No apparent distress alert and oriented x3 mood and affect normal, dressed appropriately.  HEENT: Pupils equal, extraocular movements intact  Respiratory: Patient's speak in full sentences and does not appear short of breath  Cardiovascular: Trace edema Right knee does have significant arthritic changes noted.  Crepitus  noted.  No instability with valgus and varus force.  Lacks last 15 degrees of flexion secondary to body habitus.  After informed written and verbal consent, patient was seated on exam table. Right knee was prepped with alcohol swab and utilizing anterolateral approach, patient's right knee space was injected with 48 mg per 3 mL of Synvisc 1 (sodium hyaluronate) in a prefilled syringe was injected easily into the knee through a 22-gauge needle..Patient tolerated the procedure well without immediate complications.    Impression and Recommendations:     The above documentation has been reviewed and is accurate and complete Amma Crear M Jakayla Schweppe, DO

## 2023-11-27 ENCOUNTER — Encounter: Payer: Self-pay | Admitting: Family Medicine

## 2023-11-27 ENCOUNTER — Ambulatory Visit: Admitting: Family Medicine

## 2023-11-27 VITALS — BP 138/96 | HR 95 | Ht 67.0 in

## 2023-11-27 DIAGNOSIS — G609 Hereditary and idiopathic neuropathy, unspecified: Secondary | ICD-10-CM | POA: Diagnosis not present

## 2023-11-27 DIAGNOSIS — Z8739 Personal history of other diseases of the musculoskeletal system and connective tissue: Secondary | ICD-10-CM | POA: Diagnosis not present

## 2023-11-27 DIAGNOSIS — M1711 Unilateral primary osteoarthritis, right knee: Secondary | ICD-10-CM

## 2023-11-27 DIAGNOSIS — B0229 Other postherpetic nervous system involvement: Secondary | ICD-10-CM

## 2023-11-27 MED ORDER — HYLAN G-F 20 48 MG/6ML IX SOSY
48.0000 mg | PREFILLED_SYRINGE | Freq: Once | INTRA_ARTICULAR | Status: AC
  Administered 2023-11-27: 48 mg via INTRA_ARTICULAR

## 2023-11-27 NOTE — Progress Notes (Signed)
 The 10-year ASCVD risk score (Arnett DK, et al., 2019) is: 6.8%   Values used to calculate the score:     Age: 53 years     Sex: Female     Is Non-Hispanic African American: Yes     Diabetic: No     Tobacco smoker: No     Systolic Blood Pressure: 138 mmHg     Is BP treated: Yes     HDL Cholesterol: 51 mg/dL     Total Cholesterol: 225 mg/dL  No current statin therapy, next appointment note updated.   Anita Pacheco, BSN, RN

## 2023-11-27 NOTE — Patient Instructions (Addendum)
 Good to see you! Gel injection today Ref to Pain Management See you again in 3 months

## 2023-11-27 NOTE — Assessment & Plan Note (Signed)
 Patient given injection and tolerated the procedure well, patient knows that viscosupplementation may take some time to work.  Patient does have the ability stability brace with a Tru pull lite feature on it.  Wearing it appropriately today.  Hopeful that this will make significant progress as well.  Discussed icing regimen and home exercises.  Discussed which activities to do and which ones to avoid.  Follow-up again in 6 to 8 weeks

## 2023-11-27 NOTE — Assessment & Plan Note (Signed)
 Has had difficulty with the neuropathy and multiple other musculoskeletal complaints and has asked for a referral to pain management.  Patient is mentally would like to avoid the narcotics if possible.  Will refer accordingly.

## 2023-12-02 ENCOUNTER — Ambulatory Visit: Payer: BC Managed Care – PPO | Admitting: Infectious Diseases

## 2023-12-04 ENCOUNTER — Other Ambulatory Visit

## 2023-12-04 ENCOUNTER — Other Ambulatory Visit: Payer: Self-pay

## 2023-12-04 ENCOUNTER — Encounter: Payer: Self-pay | Admitting: Infectious Diseases

## 2023-12-04 ENCOUNTER — Ambulatory Visit (INDEPENDENT_AMBULATORY_CARE_PROVIDER_SITE_OTHER): Admitting: Infectious Diseases

## 2023-12-04 VITALS — BP 158/118 | HR 93 | Temp 99.0°F | Wt 262.6 lb

## 2023-12-04 DIAGNOSIS — B20 Human immunodeficiency virus [HIV] disease: Secondary | ICD-10-CM

## 2023-12-04 DIAGNOSIS — M797 Fibromyalgia: Secondary | ICD-10-CM | POA: Diagnosis not present

## 2023-12-04 NOTE — Progress Notes (Signed)
 Name: Anita Pacheco  DOB: [birth year] MRN: [id number] PCP: Tretha Fu, MD    Subjective:   Chief Complaint  Patient presents with   Follow-up      HPI: Anita Pacheco is a 53 y.o. female with well controlled HIV, working with [facility] clinic since 2008.   Previous Regimens Include: Atripla Complera Dovato  09-2021   Genotype:   Subjective   Discussed the use of AI scribe software for clinical note transcription with the patient, who gave verbal consent to proceed.  History of Present Illness   Anita Pacheco "Anita Pacheco" is a 53 year old female with HIV here for follow up.   She experiences ongoing pain and stiffness, particularly in her hands and feet. Her fingers and feet lock up, feel cold, and have severe cramps. The pain sometimes radiates up her arm, especially around her thumbs. She uses a weighted blanket to help with the cold but notes it takes hours to warm up. Massage does seem to help. She has tried Therapist, sports and [employer] for relief, but they have not been effective.   She mentions a previous referral to a pain management specialist, which was unsuccessful due to scheduling and insurance issues. She cannot drive and relies on others for transportation, complicating her ability to attend appointments. She has not heard of an alternative referral option yet from Dr. Shirlie Dove office.   Her viral load was slightly elevated at 49 in February, which is within the range considered undetectable. She has had some difficulty maintaining her medication schedule due to her pain and doctor's appointments, but she ensures to take her medication when she remembers, spacing doses appropriately.  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 She wants to return to work, as she enjoys helping people and has been in the same job for nearly 30 years. She is not sure  where she should go for fibromyalgia care, which was suggested by rheumatology evaluation with Dr. Chaim Colony.  Diagnosed at 03/2023 consult, with diffuse widespread pain and positive tender points; recommended steroid dose pack to see if any help with symptoms and 3 week follow up which she has not done.           08/05/2023    1:57 PM  Depression screen PHQ 2/9  Decreased Interest 0  Down, Depressed, Hopeless 0  PHQ - 2 Score 0    Review of Systems  Constitutional:  Negative for chills and fever.  HENT:  Negative for tinnitus.   Eyes:  Negative for blurred vision and photophobia.  Respiratory:  Negative for cough and sputum production.   Cardiovascular:  Negative for chest pain.  Gastrointestinal:  Negative for diarrhea, nausea and vomiting.  Genitourinary:  Negative for dysuria.  Musculoskeletal:  Positive for joint pain (right).  Skin:  Negative for rash.  Neurological:  Negative for headaches.     Past Medical History:  Diagnosis Date   ANXIETY 07/22/2006   ASTHMA 07/22/2006   Breast mass 04/25/2011   Carpal tunnel syndrome 07/22/2006   HERPES ZOSTER 05/28/2010   HIV (human immunodeficiency virus infection) (HCC)    HYPERTENSION 07/22/2006   PERIPHERAL NEUROPATHY 07/03/2008   RLS (restless legs syndrome)     Outpatient Medications Prior to Visit  Medication Sig Dispense Refill   albuterol  (VENTOLIN  HFA) 108 (90 Base) MCG/ACT inhaler INHALE 2 PUFFS INTO THE LUNGS EVERY 6 HOURS AS  NEEDED FOR WHEEZING OR SHORTNESS OF BREATH 6.7 g 1   amLODipine  (NORVASC ) 10 MG tablet Take 10 mg by mouth daily.     aspirin EC 81 MG tablet Take 81 mg by mouth daily. Swallow whole.     diclofenac  sodium (VOLTAREN ) 1 % GEL Apply 1 application topically 4 (four) times daily. 100 g 6   docusate sodium  (COLACE) 100 MG capsule Take 1 capsule (100 mg total) by mouth 2 (two) times daily. 180 capsule 3   dolutegravir-lamiVUDine (DOVATO ) 50-300 MG tablet Take 1 tablet by mouth daily. 30 tablet 11    ferrous sulfate  300 (60 Fe) MG/5ML syrup Take 5 mLs (300 mg total) by mouth daily. 150 mL 3   fluticasone  (FLONASE ) 50 MCG/ACT nasal spray Place 1 spray into both nostrils daily as needed for allergies or rhinitis. 16 g 3   hydrochlorothiazide (HYDRODIURIL) 25 MG tablet Take 25 mg by mouth daily.     levocetirizine (XYZAL ) 5 MG tablet TAKE ONE TABLET EVERY EVENING 31 tablet 0   meloxicam  (MOBIC ) 15 MG tablet 1 tab(s) orally once a day for 30 day(s)     omeprazole  (PRILOSEC) 40 MG capsule TAKE 1 CAPSULE(40 MG) BY MOUTH DAILY (Patient taking differently: Take 40 mg by mouth 2 (two) times daily as needed.) 30 capsule 3   oxyCODONE -acetaminophen  (PERCOCET) 5-325 MG tablet Take 1 tablet by mouth every 4 (four) hours as needed for severe pain (pain score 7-10). 6 tablet 0   pregabalin  (LYRICA ) 100 MG capsule Take 100 mg by mouth 2 (two) times daily.     Prenatal Vit-Fe Fumarate-FA (PRENATAL PLUS/IRON ) 27-1 MG TABS Take 1 tablet by mouth daily. 180 each 0   sodium chloride  (OCEAN) 0.65 % nasal spray Place 1 spray into the nose as needed for congestion. 15 mL 12   DULoxetine (CYMBALTA) 30 MG capsule 1 cap(s) orally 2 times a day for 30 days     Fluticasone -Salmeterol 113-14 MCG/ACT AEPB Inhale 1 Dose into the lungs daily. 1 each 3   No facility-administered medications prior to visit.     Allergies  Allergen Reactions   Hydrocodone Hives and Itching    Excessive vomiting   Enalapril  Rash    Social History   Tobacco Use   Smoking status: Never   Smokeless tobacco: Never  Vaping Use   Vaping status: Never Used  Substance Use Topics   Alcohol use: Yes    Comment: holidays   Drug use: No     Social History   Substance and Sexual Activity  Sexual Activity Not Currently   Comment: pt. declined condoms     Objective    Objective:   Vitals:   12/04/23 1055  BP: (!) 158/118  Pulse: 93  Temp: 99 F (37.2 C)  TempSrc: Oral  SpO2: 97%  Weight: 262 lb 9.6 oz (119.1 kg)    Body  mass index is 41.13 kg/m.  Physical Exam Constitutional:      Appearance: Normal appearance. She is not ill-appearing.  HENT:     Mouth/Throat:     Mouth: Mucous membranes are moist.     Pharynx: Oropharynx is clear.  Eyes:     General: No scleral icterus. Cardiovascular:     Rate and Rhythm: Normal rate.  Pulmonary:     Effort: Pulmonary effort is normal.  Neurological:     Mental Status: She is oriented to person, place, and time.  Psychiatric:        Mood and Affect: Mood  normal.        Thought Content: Thought content normal.     Lab Results Lab Results  Component Value Date   WBC 5.9 08/05/2023   HGB 11.1 (L) 08/05/2023   HCT 35.8 08/05/2023   MCV 85.4 08/05/2023   PLT 238 08/05/2023    Lab Results  Component Value Date   CREATININE 0.78 08/05/2023   BUN 15 08/05/2023   NA 139 08/05/2023   K 3.5 08/05/2023   CL 104 08/05/2023   CO2 27 08/05/2023    Lab Results  Component Value Date   ALT 10 08/05/2023   AST 15 08/05/2023   ALKPHOS 58 12/03/2016   BILITOT 0.4 08/05/2023    Lab Results  Component Value Date   CHOL 225 (H) 08/25/2022   HDL 51 08/25/2022   LDLCALC 140 (H) 08/25/2022   TRIG 203 (H) 08/25/2022   CHOLHDL 4.4 08/25/2022   HIV 1 RNA Quant  Date Value  08/05/2023 49 Copies/mL (H)  08/25/2022 <20 DETECTED copies/mL (A)  08/23/2021 <20 DETECTED copies/mL (A)   CD4 T Cell Abs (/uL)  Date Value  08/05/2023 876  08/25/2022 847  02/14/2020 493      Assessment & Plan:     HIV Infection -  Viral load slightly elevated at 49 copies/mL in February, considered a blip and not concerning. No significant issues with medication adherence, though some missed doses due to pain and schedule disruptions. She agrees to delay repeat viral load testing until October or November unless earlier testing is requested. Continue Dovato  once daily with time spent to ensure more consistent doses.  - continue dovato  once daily.  - Labs due on Oct/Nov.    Chronic pain and stiffness -  Fibromyalgia Dx 03/2023 -  Chronic pain and stiffness in hands, fingers, and feet, exacerbated by cold and prolonged activity. Symptoms include locking of fingers and severe cramps. No findings c/w Raynauds on exam today. Capsaicin cream suggested for its warming effect, with caution advised to avoid facial contact after application. I don't think she tried the steroid recommendation from rheumatology and they wanted her to follow up in 3 weeks.  - Recommend trial of capsaicin cream for warming effect on hands, cautioning against touching face after application. - I encouraged her to discuss further with her PCP whom she has an appointment with soon.  - I think she can follow up with rheumatology  - Awaiting pain management referral  - National Fibromyalgia & Chronic Pain Association (NfmCPA) [facility] Support Group Support Group Leader Roanne Chi, [email or web address]  P: [phone]   Hypertension -  Elevated blood pressure, exacerbated by stress and anxiety. - has follow up with PCP for further management in a few weeks.   Recording duration: 20 minutes      She has completed her first dose of shingrix  in 2023 - will need to clarify if she needs second dose soon. Menveo and Prevnar 20 due at return visit.    No orders of the defined types were placed in this encounter.  No orders of the defined types were placed in this encounter.   Return in about 5 months (around 05/05/2024).    Anita Kurtz, MSN, NP-C [facility] Pager: [phone] Office: [phone]  12/04/23  12:57 PM

## 2023-12-04 NOTE — Patient Instructions (Addendum)
 Capzacin Cream - don't touch your face with it but the warming effect may help the hand discomfort.   Ask Dr. Sherlene Diss to see what to do about the fibromyalgia and where to go for that.   Please call Dr. Felipe Horton about your pain management referral if you don't hear from it  Phone: [phone]

## 2024-01-04 ENCOUNTER — Telehealth: Payer: Self-pay

## 2024-01-04 NOTE — Telephone Encounter (Signed)
 Tried to call patient to advise that she still needs to cancel the [employer] with the navigators since she has been approved for [employer]

## 2024-02-02 ENCOUNTER — Ambulatory Visit
Attending: Student in an Organized Health Care Education/Training Program | Admitting: Student in an Organized Health Care Education/Training Program

## 2024-02-02 ENCOUNTER — Encounter: Payer: Self-pay | Admitting: Student in an Organized Health Care Education/Training Program

## 2024-02-02 VITALS — BP 154/111 | HR 79 | Temp 97.8°F | Resp 16 | Ht 67.0 in | Wt 262.0 lb

## 2024-02-02 DIAGNOSIS — B20 Human immunodeficiency virus [HIV] disease: Secondary | ICD-10-CM | POA: Diagnosis present

## 2024-02-02 DIAGNOSIS — G894 Chronic pain syndrome: Secondary | ICD-10-CM | POA: Diagnosis not present

## 2024-02-02 DIAGNOSIS — M1711 Unilateral primary osteoarthritis, right knee: Secondary | ICD-10-CM | POA: Diagnosis present

## 2024-02-02 DIAGNOSIS — B0229 Other postherpetic nervous system involvement: Secondary | ICD-10-CM | POA: Diagnosis present

## 2024-02-02 NOTE — Progress Notes (Signed)
 Safety precautions to be maintained throughout the outpatient stay will include: orient to surroundings, keep bed in low position, maintain call bell within reach at all times, provide assistance with transfer out of bed and ambulation.

## 2024-02-02 NOTE — Progress Notes (Signed)
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 fibromyalgia and osteoarthritis. Previous treatments, including Lyrica , Cymbalta, and Mobic , provided limited relief. Her current high dose of Lyrica  is inadequate for pain control. The severe pain impacts daily functioning and quality of life. Order a baseline urine screen to consider other medication options potentially including buprenorphine, low-dose naltrexone, tramadol.  Discuss medication trials with the nurse practitioner [facility], including options like buprenorphine and low dose naltrexone.  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 Inadequate pain management contributes to sleep disturbances. Discuss medication  trials with the nurse practitioner to address pain and potentially improve sleep.  Osteoarthritis of right knee   Chronic osteoarthritis in her right knee contributes to pain and functional limitations. Previous treatments, including Synvisc injections, have been ineffective. Weight and age are barriers to knee replacement surgery.        Lab Orders         Compliance Drug Analysis, Ur     Provider-requested follow-up: Return in about 2 weeks (around 02/16/2024) for Emmy Blanch, MM.  Future Appointments  Date Time Provider Department Center  02/16/2024  2:00 PM Patel, Seema K, NP [facility] None  03/02/2024 10:30 AM Claudene Arthea HERO, DO [facility] None  05/10/2024 11:15 AM Melvenia Corean SAILOR, NP [facility]   I discussed the assessment and treatment plan with the patient. The patient was provided an opportunity to ask questions and all were answered. The patient agreed with the plan and demonstrated an understanding of the instructions.  Patient advised to call back or seek an in-person evaluation if the symptoms or condition worsens.  Duration of encounter: .  Total time on encounter, as per AMA guidelines included both the face-to-face and non-face-to-face time personally spent by the physician and/or other qualified health care professional(s) on the day of the encounter (includes time in activities that require the physician or other qualified health care professional and does not include time in activities normally performed by clinical staff). Physician's time may include the following activities when performed: Preparing to see the patient (e.g., pre-charting review of records, searching for previously ordered imaging, lab work, and nerve conduction tests) Review of prior analgesic pharmacotherapies. Reviewing PMP Interpreting ordered tests (e.g., lab work, imaging, nerve conduction tests) Performing post-procedure evaluations, including interpretation of diagnostic  procedures Obtaining and/or reviewing separately obtained history Performing a medically appropriate examination and/or evaluation Counseling and educating the patient/family/caregiver Ordering medications, tests, or procedures Referring and communicating with other health care professionals (when not separately reported) Documenting clinical information in the electronic or other health record Independently interpreting results (not separately reported) and communicating results to the patient/ family/caregiver Care coordination (not separately reported)  Note by: Wallie Sherry, MD (TTS and AI technology used. I apologize for any typographical errors that were not detected and corrected.) Date: 02/02/2024; Time: 3:53 PM

## 2024-02-04 LAB — COMPLIANCE DRUG ANALYSIS, UR

## 2024-02-16 ENCOUNTER — Ambulatory Visit: Attending: Nurse Practitioner | Admitting: Nurse Practitioner

## 2024-02-16 ENCOUNTER — Encounter: Payer: Self-pay | Admitting: Nurse Practitioner

## 2024-02-16 VITALS — BP 133/85 | Temp 97.2°F | Ht 67.0 in | Wt 262.0 lb

## 2024-02-16 DIAGNOSIS — Z79899 Other long term (current) drug therapy: Secondary | ICD-10-CM | POA: Insufficient documentation

## 2024-02-16 DIAGNOSIS — B20 Human immunodeficiency virus [HIV] disease: Secondary | ICD-10-CM | POA: Insufficient documentation

## 2024-02-16 DIAGNOSIS — G894 Chronic pain syndrome: Secondary | ICD-10-CM | POA: Diagnosis not present

## 2024-02-16 DIAGNOSIS — B0229 Other postherpetic nervous system involvement: Secondary | ICD-10-CM | POA: Insufficient documentation

## 2024-02-16 DIAGNOSIS — M1711 Unilateral primary osteoarthritis, right knee: Secondary | ICD-10-CM | POA: Insufficient documentation

## 2024-02-16 MED ORDER — NALTREXONE HCL 50 MG PO TABS: 25.0000 mg | ORAL_TABLET | Freq: Every day | ORAL | 0 refills | Status: DC

## 2024-02-16 NOTE — Progress Notes (Signed)
 PROVIDER NOTE: Interpretation of information contained herein should be left to medically-trained personnel. Specific patient instructions are provided elsewhere under Patient Instructions section of medical record. This document was created in part using AI and STT-dictation technology, any transcriptional errors that may result from this process are unintentional.  Patient: Anita Pacheco  Service: E/M   PCP: Catalina Bare, MD  DOB: 02-13-1971  DOS: 02/16/2024  Provider: Emmy MARLA Blanch, NP  MRN: [id number]  Delivery: Face-to-face  Specialty: Interventional Pain Management  Type: Established Patient  Setting: Ambulatory outpatient facility  Specialty designation: 09  Referring Prov.: Catalina Bare, MD  Location: Outpatient office facility       History of present illness (HPI) [name], a 53 y.o. year old female, is here today because of her Chronic pain syndrome [G89.4]. Ms. Feldmeier primary complain today is Arm Pain (right)  Pertinent problems: Ms. Pense has Human immunodeficiency virus (HIV) disease (HCC); Postherpetic neuralgia; HERPES ZOSTER; Obesity; Anxiety state; Carpal tunnel syndrome; Hereditary and idiopathic peripheral neuropathy; Degenerative arthritis of right knee; Other (Generalized joint pain ? Fibromyalgia ); and Chronic pain syndrome on their pertinent problem list.   Pain Assessment: Severity of Chronic pain is reported as a 4 /10. Location: Arm Right/pain radiaties down her right side. Onset: More than a month ago. Quality: Aching, Discomfort, Constant, Sharp, Tingling. Timing: Constant. Modifying factor(s): Nothing. Vitals:  height is 5' 7 (1.702 m) and weight is 262 lb (118.8 kg). Her temperature is 97.2 F (36.2 C) (abnormal). Her blood pressure is 133/85. Her oxygen saturation is 100%.  BMI: Estimated body mass index is 41.04 kg/m as calculated from the following:   Height as of this encounter: 5' 7 (1.702 m).   Weight as of this encounter: 262  lb (118.8 kg).  Last encounter: 02/02/2024 Last procedure: Visit date not found.  Reason for encounter: medication management. No change in medical history since last visit.  Patient's pain is at baseline.  Patient continues multimodal pain regimen as prescribed.  States that it provides pain relief and improvement in functional status.  The patient reports right arm pain possibly related to fibromyalgia, along with bilateral hand and leg cramps causing sleep disturbance and risk of falls.  During her previous visit with Dr. Marcelino, he recommended a Butrans patch for naltrexone  for pain management.   Pharmacotherapy Assessment   Naltrexone  25 mg tablet by mouth daily Monitoring: [place] PMP: PDMP reviewed during this encounter.       Pharmacotherapy: No side-effects or adverse reactions reported. Compliance: No problems identified. Effectiveness: Clinically acceptable.  Delores Dorothe LABOR, RN  02/16/2024  1:54 PM  Sign when Signing Visit Safety precautions to be maintained throughout the outpatient stay will include: orient to surroundings, keep bed in low position, maintain call bell within reach at all times, provide assistance with transfer out of bed and ambulation.     UDS:  Summary  Date Value Ref Range Status  02/02/2024 FINAL  Final    Comment:    ==================================================================== Compliance Drug Analysis, Ur ==================================================================== Test                             Result       Flag       Units  Drug Absent but Declared for Prescription Verification   Pregabalin                      Not Detected UNEXPECTED  Diclofenac                      Not Detected UNEXPECTED    Diclofenac , as indicated in the declared medication list, is not    always detected even when used as directed.    Salicylate                     Not Detected UNEXPECTED    Aspirin, as indicated in the declared medication list, is not always     detected even when used as directed.    Atenolol                        Not Detected UNEXPECTED ==================================================================== Test                      Result    Flag   Units      Ref Range   Creatinine              355              mg/dL      >=79 ==================================================================== Declared Medications:  The flagging and interpretation on this report are based on the  following declared medications.  Unexpected results may arise from  inaccuracies in the declared medications.   **Note: The testing scope of this panel includes these medications:   Atenolol  (Tenoretic )  Pregabalin  (Lyrica )   **Note: The testing scope of this panel does not include small to  moderate amounts of these reported medications:   Aspirin  Diclofenac  (Voltaren )   **Note: The testing scope of this panel does not include the  following reported medications:   Albuterol  (Ventolin  HFA)  Amlodipine  (Norvasc )  Chlorthalidone  (Tenoretic )  Docusate (Colace)  Dolutegravir (Dovato )  Fluticasone  (Flonase )  Formoterol  (Dulera)  Iron   Lamivudine (Dovato )  Levocetirizine (Xyzal )  Meloxicam  (Mobic )  Mometasone  (Dulera)  Omeprazole  (Prilosec)  Salmeterol  Sodium Chloride   Vitamin D3 ==================================================================== For clinical consultation, please call [phone]. ====================================================================     No results found for: CBDTHCR No results found for: D8THCCBX No results found for: D9THCCBX  ROS  Constitutional: Denies any fever or chills Gastrointestinal: No reported hemesis, hematochezia, vomiting, or acute GI distress Musculoskeletal: Denies any acute onset joint swelling, redness, loss of ROM, or weakness Neurological: No reported episodes of acute onset apraxia, aphasia, dysarthria, agnosia, amnesia, paralysis, loss of coordination, or loss of  consciousness  Medication Review  Fluticasone -Salmeterol, albuterol , amLODipine , aspirin EC, atenolol -chlorthalidone , cholecalciferol, diclofenac  sodium, docusate sodium , dolutegravir-lamiVUDine, ferrous sulfate , fluticasone , levocetirizine, meloxicam , mometasone -formoterol , naltrexone , omeprazole , pregabalin , and sodium chloride   History Review  Allergy: Ms. Shober is allergic to hydrocodone, prednisone, and enalapril . Drug: Ms. Canizales  reports no history of drug use. Alcohol:  reports current alcohol use. Tobacco:  reports that she has never smoked. She has never used smokeless tobacco. Social: Ms. Reifschneider  reports that she has never smoked. She has never used smokeless tobacco. She reports current alcohol use. She reports that she does not use drugs. Medical:  has a past medical history of ANXIETY (07/22/2006), ASTHMA (07/22/2006), Breast mass (04/25/2011), Carpal tunnel syndrome (07/22/2006), HERPES ZOSTER (05/28/2010), HIV (human immunodeficiency virus infection) (HCC), HYPERTENSION (07/22/2006), PERIPHERAL NEUROPATHY (07/03/2008), and RLS (restless legs syndrome). Surgical: Ms. Mathison  has a past surgical history that includes Stapedes surgery (Right); Endovenous ablation saphenous vein w/ laser (Right, 12/22/2017); and Cesarean section. Family: family history includes Asthma in her daughter and daughter; CAD  in her father and mother; GER disease in her mother; Hypertension in her mother; Kidney disease in her son; Lymphoma in her maternal aunt; Ovarian cancer in her maternal aunt; Pancreatic cancer in her father; Throat cancer in her paternal grandmother.  Laboratory Chemistry Profile   Renal Lab Results  Component Value Date   BUN 15 08/05/2023   CREATININE 0.78 08/05/2023   LABCREA 208 09/06/2019   BCR SEE NOTE: 08/05/2023   GFRAA 105 02/14/2020   GFRNONAA 91 02/14/2020    Hepatic Lab Results  Component Value Date   AST 15 08/05/2023   ALT 10 08/05/2023   ALBUMIN 3.7  12/03/2016   ALKPHOS 58 12/03/2016   HCVAB NO 08/24/2006    Electrolytes Lab Results  Component Value Date   NA 139 08/05/2023   K 3.5 08/05/2023   CL 104 08/05/2023   CALCIUM 8.9 08/05/2023    Bone No results found for: VD25OH, VD125OH2TOT, CI6874NY7, CI7874NY7, 25OHVITD1, 25OHVITD2, 25OHVITD3, TESTOFREE, TESTOSTERONE  Inflammation (CRP: Acute Phase) (ESR: Chronic Phase) No results found for: CRP, ESRSEDRATE, LATICACIDVEN       Note: Above Lab results reviewed.  Recent Imaging Review  US  LIMITED JOINT SPACE STRUCTURES LOW RIGHT(NO LINKED CHARGES) No images saved  Note: Reviewed        Physical Exam  Vitals: BP 133/85   Temp (!) 97.2 F (36.2 C)   Ht 5' 7 (1.702 m)   Wt 262 lb (118.8 kg)   SpO2 100%   BMI 41.04 kg/m  BMI: Estimated body mass index is 41.04 kg/m as calculated from the following:   Height as of this encounter: 5' 7 (1.702 m).   Weight as of this encounter: 262 lb (118.8 kg). Ideal: Ideal body weight: 61.6 kg (135 lb 12.9 oz) Adjusted ideal body weight: 84.5 kg (186 lb 4.5 oz) General appearance: Well nourished, well developed, and well hydrated. In no apparent acute distress Mental status: Alert, oriented x 3 (person, place, & time)       Respiratory: No evidence of acute respiratory distress Eyes: PERLA   Assessment   Diagnosis Status  1. Chronic pain syndrome   2. Postherpetic neuralgia   3. Human immunodeficiency virus (HIV) disease (HCC)   4. Primary osteoarthritis of right knee   5. Medication management    Controlled Controlled Controlled   Updated Problems: No problems updated.  Plan of Care  Problem-specific:  Assessment and Plan Started naltrexone  25 Mg tablet daily by mouth. Prescribing drug monitoring (PDMP) reviewed.  Urine drug screening (UDS) up-to-date.  Schedule follow-up in 30 days for medication management evaluation with Emmy Blanch, NP.  No other new issues or problems reported to this  visit.   Ms. Mayela Tassin has a current medication list which includes the following long-term medication(s): albuterol , ferrous sulfate , fluticasone , fluticasone -salmeterol, levocetirizine, omeprazole , pregabalin , and sodium chloride .  Pharmacotherapy (Medications Ordered): Meds ordered this encounter  Medications   naltrexone  (DEPADE) 50 MG tablet    Sig: Take 0.5 tablets (25 mg total) by mouth daily.    Dispense:  15 tablet    Refill:  0   Orders:  No orders of the defined types were placed in this encounter.       Return in about 1 month (around 03/18/2024) for (F2F), (MM), Emmy Blanch NP.    Recent Visits Date Type Provider Dept  02/02/24 Office Visit Marcelino Nurse, MD [facility]  Showing recent visits within past 90 days and meeting all other requirements Today's Visits Date Type  Provider Dept  02/16/24 Office Visit Kerrion Kemppainen K, NP [facility]  Showing today's visits and meeting all other requirements Future Appointments Date Type Provider Dept  03/17/24 Appointment Tressia Labrum K, NP [facility]  Showing future appointments within next 90 days and meeting all other requirements  I discussed the assessment and treatment plan with the patient. The patient was provided an opportunity to ask questions and all were answered. The patient agreed with the plan and demonstrated an understanding of the instructions.  Patient advised to call back or seek an in-person evaluation if the symptoms or condition worsens.  Duration of encounter: 30 minutes.  Total time on encounter, as per AMA guidelines included both the face-to-face and non-face-to-face time personally spent by the physician and/or other qualified health care professional(s) on the day of the encounter (includes time in activities that require the physician or other qualified health care professional and does not include time in activities normally performed by clinical staff).  Physician's time may include the following activities when performed: Preparing to see the patient (e.g., pre-charting review of records, searching for previously ordered imaging, lab work, and nerve conduction tests) Review of prior analgesic pharmacotherapies. Reviewing PMP Interpreting ordered tests (e.g., lab work, imaging, nerve conduction tests) Performing post-procedure evaluations, including interpretation of diagnostic procedures Obtaining and/or reviewing separately obtained history Performing a medically appropriate examination and/or evaluation Counseling and educating the patient/family/caregiver Ordering medications, tests, or procedures Referring and communicating with other health care professionals (when not separately reported) Documenting clinical information in the electronic or other health record Independently interpreting results (not separately reported) and communicating results to the patient/ family/caregiver Care coordination (not separately reported)  Note by: Amore Ackman K Josias Tomerlin, NP (TTS and AI technology used. I apologize for any typographical errors that were not detected and corrected.) Date: 02/16/2024; Time: 2:50 PM

## 2024-02-16 NOTE — Progress Notes (Signed)
 Safety precautions to be maintained throughout the outpatient stay will include: orient to surroundings, keep bed in low position, maintain call bell within reach at all times, provide assistance with transfer out of bed and ambulation.

## 2024-02-26 ENCOUNTER — Other Ambulatory Visit: Payer: Self-pay | Admitting: Infectious Diseases

## 2024-02-26 DIAGNOSIS — B20 Human immunodeficiency virus [HIV] disease: Secondary | ICD-10-CM

## 2024-03-02 ENCOUNTER — Ambulatory Visit: Admitting: Family Medicine

## 2024-03-17 ENCOUNTER — Ambulatory Visit: Attending: Nurse Practitioner | Admitting: Nurse Practitioner

## 2024-03-17 ENCOUNTER — Encounter: Payer: Self-pay | Admitting: Nurse Practitioner

## 2024-03-17 VITALS — BP 138/101 | HR 77 | Temp 97.3°F | Resp 16 | Ht 67.0 in | Wt 262.0 lb

## 2024-03-17 DIAGNOSIS — Z79899 Other long term (current) drug therapy: Secondary | ICD-10-CM | POA: Diagnosis present

## 2024-03-17 DIAGNOSIS — B0229 Other postherpetic nervous system involvement: Secondary | ICD-10-CM | POA: Insufficient documentation

## 2024-03-17 DIAGNOSIS — G894 Chronic pain syndrome: Secondary | ICD-10-CM | POA: Diagnosis present

## 2024-03-17 DIAGNOSIS — M1711 Unilateral primary osteoarthritis, right knee: Secondary | ICD-10-CM | POA: Insufficient documentation

## 2024-03-17 MED ORDER — TRAMADOL HCL 50 MG PO TABS: 50.0000 mg | ORAL_TABLET | Freq: Two times a day (BID) | ORAL | 0 refills | Status: AC

## 2024-03-17 NOTE — Progress Notes (Signed)
 Safety precautions to be maintained throughout the outpatient stay will include: orient to surroundings, keep bed in low position, maintain call bell within reach at all times, provide assistance with transfer out of bed and ambulation.   Patient did not bring medication with her today.

## 2024-03-17 NOTE — Progress Notes (Signed)
 PROVIDER NOTE: Interpretation of information contained herein should be left to medically-trained personnel. Specific patient instructions are provided elsewhere under Patient Instructions section of medical record. This document was created in part using AI and STT-dictation technology, any transcriptional errors that may result from this process are unintentional.  Patient: Anita Pacheco  Service: E/M   PCP: Catalina Bare, MD  DOB: October 08, 1970  DOS: 03/17/2024  Provider: Emmy MARLA Blanch, NP  MRN: [id number]  Delivery: Face-to-face  Specialty: Interventional Pain Management  Type: Established Patient  Setting: Ambulatory outpatient facility  Specialty designation: 09  Referring Prov.: Catalina Bare, MD  Location: Outpatient office facility       History of present illness (HPI) Anita Pacheco, a 53 y.o. year old female, is here today because of her Chronic pain syndrome [G89.4]. [name] primary complain today is Pain (Entire body/joints/ fibro.)  Pertinent problems: [name] has Human immunodeficiency virus (HIV) disease (HCC); Postherpetic neuralgia; HERPES ZOSTER; Obesity; Anxiety state; Carpal tunnel syndrome; Hereditary and idiopathic peripheral neuropathy; Degenerative arthritis of right knee; Other (Generalized joint pain ? Fibromyalgia ); and Chronic pain syndrome on their pertinent problem list.  Pain Assessment: Severity of Chronic pain is reported as a 5 /10. Location: Abdomen (entire body)  / . Onset: More than a month ago. Quality: Aching, Constant, Tiring, Sore, Discomfort (after taking medication that was started). Timing: Constant. Modifying factor(s): nothing/ new medication making hersick and having stomach issues. Vitals:  height is 5' 7 (1.702 m) and weight is 262 lb (118.8 kg). Her temperature is 97.3 F (36.3 C) (abnormal). Her blood pressure is 138/101 (abnormal) and her pulse is 77. Her respiration is 16 and oxygen saturation is 100%.  BMI: Estimated  body mass index is 41.04 kg/m as calculated from the following:   Height as of this encounter: 5' 7 (1.702 m).   Weight as of this encounter: 262 lb (118.8 kg).  Last encounter: 02/16/2024. Last procedure: Visit date not found.  Reason for encounter: medication management. No change in medical history since last visit.  Patient's pain is at baseline.  Patient continues multimodal pain regimen as prescribed.  States that it provides pain relief and improvement in functional status.  The patient continues to experience generalized body and joint pain consistent with fibromyalgia.  During her previous visit, we discussed several medication options and decided to initiate low-dose naltrexone .  However, the patient reports no pain relief from naltrexone  and states that it disrupts her sleep, causing her to wake up throughout the night.  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  I educated her that her circadian rhythm has shifted over the past 4 months since she is not longer working nights, which may be contributing to her sleep disturbance.   After discussion, we discontinue naltrexone  and initiated tramadol  50 mg twice daily for pain management. Pharmacotherapy Assessment   Tramadol  (Ultram ) 50 mg tablet twice daily as needed for pain. Naltrexone  25 mg tablet by mouth daily (discontinue on 03/17/2024) Monitoring: [place] PMP: PDMP reviewed during this encounter.       Pharmacotherapy: No side-effects or adverse reactions reported. Compliance: No problems identified. Effectiveness: Clinically acceptable.  Dorlene Montie FALCON, RN  03/17/2024 11:21 AM  Sign when Signing Visit Safety precautions to be maintained throughout the outpatient stay will include: orient to surroundings, keep bed in low position, maintain call bell within reach at all times, provide assistance with transfer out of bed and ambulation.   Patient did not bring medication with  her today.   UDS:  Summary  Date Value  Ref Range Status  02/02/2024 FINAL  Final    Comment:    ==================================================================== Compliance Drug Analysis, Ur ==================================================================== Test                             Result       Flag       Units  Drug Absent but Declared for Prescription Verification   Pregabalin                      Not Detected UNEXPECTED   Diclofenac                      Not Detected UNEXPECTED    Diclofenac , as indicated in the declared medication list, is not    always detected even when used as directed.    Salicylate                     Not Detected UNEXPECTED    Aspirin, as indicated in the declared medication list, is not always    detected even when used as directed.    Atenolol                        Not Detected UNEXPECTED ==================================================================== Test                      Result    Flag   Units      Ref Range   Creatinine              355              mg/dL      >=79 ==================================================================== Declared Medications:  The flagging and interpretation on this report are based on the  following declared medications.  Unexpected results may arise from  inaccuracies in the declared medications.   **Note: The testing scope of this panel includes these medications:   Atenolol  (Tenoretic )  Pregabalin  (Lyrica )   **Note: The testing scope of this panel does not include small to  moderate amounts of these reported medications:   Aspirin  Diclofenac  (Voltaren )   **Note: The testing scope of this panel does not include the  following reported medications:   Albuterol  (Ventolin  HFA)  Amlodipine  (Norvasc )  Chlorthalidone  (Tenoretic )  Docusate (Colace)  Dolutegravir (Dovato )  Fluticasone  (Flonase )  Formoterol  (Dulera)  Iron   Lamivudine (Dovato )  Levocetirizine (Xyzal )  Meloxicam  (Mobic )  Mometasone  (Dulera)  Omeprazole   (Prilosec)  Salmeterol  Sodium Chloride   Vitamin D3 ==================================================================== For clinical consultation, please call [phone]. ====================================================================     No results found for: CBDTHCR No results found for: D8THCCBX No results found for: D9THCCBX  ROS  Constitutional: Denies any fever or chills Gastrointestinal: No reported hemesis, hematochezia, vomiting, or acute GI distress Musculoskeletal: Generalized body pain, fibromyalgia Neurological: No reported episodes of acute onset apraxia, aphasia, dysarthria, agnosia, amnesia, paralysis, loss of coordination, or loss of consciousness  Medication Review  Fluticasone -Salmeterol, albuterol , amLODipine , aspirin EC, atenolol -chlorthalidone , cholecalciferol, diclofenac  sodium, docusate sodium , dolutegravir-lamiVUDine, ferrous sulfate , fluticasone , levocetirizine, meloxicam , mometasone -formoterol , naltrexone , omeprazole , pregabalin , sodium chloride , and traMADol   History Review  Allergy: Ms. Montemurro is allergic to hydrocodone, prednisone, and enalapril . Drug: Ms. Nolden  reports no history of drug use. Alcohol:  reports current alcohol use. Tobacco:  reports that she has never smoked. She has never used smokeless  tobacco. Social: Ms. Gross  reports that she has never smoked. She has never used smokeless tobacco. She reports current alcohol use. She reports that she does not use drugs. Medical:  has a past medical history of ANXIETY (07/22/2006), ASTHMA (07/22/2006), Breast mass (04/25/2011), Carpal tunnel syndrome (07/22/2006), HERPES ZOSTER (05/28/2010), HIV (human immunodeficiency virus infection) (HCC), HYPERTENSION (07/22/2006), PERIPHERAL NEUROPATHY (07/03/2008), and RLS (restless legs syndrome). Surgical: Ms. Furniss  has a past surgical history that includes Stapedes surgery (Right); Endovenous ablation saphenous vein w/ laser (Right,  12/22/2017); and Cesarean section. Family: family history includes Asthma in her daughter and daughter; CAD in her father and mother; GER disease in her mother; Hypertension in her mother; Kidney disease in her son; Lymphoma in her maternal aunt; Ovarian cancer in her maternal aunt; Pancreatic cancer in her father; Throat cancer in her paternal grandmother.  Laboratory Chemistry Profile   Renal Lab Results  Component Value Date   BUN 15 08/05/2023   CREATININE 0.78 08/05/2023   LABCREA 208 09/06/2019   BCR SEE NOTE: 08/05/2023   GFRAA 105 02/14/2020   GFRNONAA 91 02/14/2020    Hepatic Lab Results  Component Value Date   AST 15 08/05/2023   ALT 10 08/05/2023   ALBUMIN 3.7 12/03/2016   ALKPHOS 58 12/03/2016   HCVAB NO 08/24/2006    Electrolytes Lab Results  Component Value Date   NA 139 08/05/2023   K 3.5 08/05/2023   CL 104 08/05/2023   CALCIUM 8.9 08/05/2023    Bone No results found for: VD25OH, VD125OH2TOT, CI6874NY7, CI7874NY7, 25OHVITD1, 25OHVITD2, 25OHVITD3, TESTOFREE, TESTOSTERONE  Inflammation (CRP: Acute Phase) (ESR: Chronic Phase) No results found for: CRP, ESRSEDRATE, LATICACIDVEN       Note: Above Lab results reviewed.  Recent Imaging Review  US  LIMITED JOINT SPACE STRUCTURES LOW RIGHT(NO LINKED CHARGES) No images saved  Note: Reviewed        Physical Exam  Vitals: BP (!) 138/101   Pulse 77   Temp (!) 97.3 F (36.3 C)   Resp 16   Ht 5' 7 (1.702 m)   Wt 262 lb (118.8 kg)   SpO2 100%   BMI 41.04 kg/m  BMI: Estimated body mass index is 41.04 kg/m as calculated from the following:   Height as of this encounter: 5' 7 (1.702 m).   Weight as of this encounter: 262 lb (118.8 kg). Ideal: Ideal body weight: 61.6 kg (135 lb 12.9 oz) Adjusted ideal body weight: 84.5 kg (186 lb 4.5 oz) General appearance: Well nourished, well developed, and well hydrated. In no apparent acute distress Mental status: Alert, oriented x 3 (person,  place, & time)       Respiratory: No evidence of acute respiratory distress Eyes: PERLA   Assessment   Diagnosis Status  1. Chronic pain syndrome   2. Postherpetic neuralgia   3. Primary osteoarthritis of right knee   4. Medication management    Controlled Controlled Controlled   Updated Problems: No problems updated.  Plan of Care  Problem-specific:  Assessment and Plan Discontinue naltrexone  25 mg tablet daily on 03/17/2024. Started tramadol  (Ultram ) 50 mg tablet 2 times daily as needed for pain. Prescribing drug monitoring (PDMP) reviewed; findings consistent with the use of prescribed medication and no evidence of narcotic misuse or abuse.  Urine drug screening (UDS) up-to-date.  No other new issues or concerns reported at this visit.  Schedule follow-up in 30 days for medication management.   Ms. Santana Schiavi has a current medication list which includes  the following long-term medication(s): albuterol , ferrous sulfate , fluticasone , fluticasone -salmeterol, levocetirizine, omeprazole , pregabalin , and sodium chloride .  Pharmacotherapy (Medications Ordered): Meds ordered this encounter  Medications   traMADol  (ULTRAM ) 50 MG tablet    Sig: Take 1 tablet (50 mg total) by mouth 2 (two) times daily.    Dispense:  60 tablet    Refill:  0    Fill one day early if pharmacy is closed on scheduled refill date. Do not fill until: To last until:   Orders:  No orders of the defined types were placed in this encounter.       Return in about 1 month (around 04/16/2024) for (F2F), (MM), Emmy Blanch NP.    Recent Visits Date Type Provider Dept  02/16/24 Office Visit Saveon Plant K, NP [facility]  02/02/24 Office Visit Marcelino Nurse, MD [facility]  Showing recent visits within past 90 days and meeting all other requirements Today's Visits Date Type Provider Dept  03/17/24 Office Visit Joelene Barriere K, NP [facility]  Showing today's visits and  meeting all other requirements Future Appointments Date Type Provider Dept  04/13/24 Appointment Lino Wickliff K, NP [facility]  Showing future appointments within next 90 days and meeting all other requirements  I discussed the assessment and treatment plan with the patient. The patient was provided an opportunity to ask questions and all were answered. The patient agreed with the plan and demonstrated an understanding of the instructions.  Patient advised to call back or seek an in-person evaluation if the symptoms or condition worsens. I personally spent a total of 30 minutes in the care of the patient today including preparing to see the patient, getting/reviewing separately obtained history, performing a medically appropriate exam/evaluation, counseling and educating, placing orders, documenting clinical information in the EHR, independently interpreting results, and communicating results.  Duration of encounter:  minutes.  Total time on encounter, as per AMA guidelines included both the face-to-face and non-face-to-face time personally spent by the physician and/or other qualified health care professional(s) on the day of the encounter (includes time in activities that require the physician or other qualified health care professional and does not include time in activities normally performed by clinical staff). Physician's time may include the following activities when performed: Preparing to see the patient (e.g., pre-charting review of records, searching for previously ordered imaging, lab work, and nerve conduction tests) Review of prior analgesic pharmacotherapies. Reviewing PMP Interpreting ordered tests (e.g., lab work, imaging, nerve conduction tests) Performing post-procedure evaluations, including interpretation of diagnostic procedures Obtaining and/or reviewing separately obtained history Performing a medically appropriate examination and/or evaluation Counseling and  educating the patient/family/caregiver Ordering medications, tests, or procedures Referring and communicating with other health care professionals (when not separately reported) Documenting clinical information in the electronic or other health record Independently interpreting results (not separately reported) and communicating results to the patient/ family/caregiver Care coordination (not separately reported)  Note by: Emmy MARLA Blanch, NP   Date: 03/17/2024; Time: 12:09 PM

## 2024-03-29 ENCOUNTER — Ambulatory Visit: Admitting: Family Medicine

## 2024-03-29 VITALS — BP 132/88 | HR 78 | Ht 67.0 in | Wt 262.0 lb

## 2024-03-29 DIAGNOSIS — M1711 Unilateral primary osteoarthritis, right knee: Secondary | ICD-10-CM

## 2024-03-29 DIAGNOSIS — M25561 Pain in right knee: Secondary | ICD-10-CM | POA: Diagnosis not present

## 2024-03-29 NOTE — Assessment & Plan Note (Signed)
 Unfortunately patient is not improving.  Did not feel the viscosupplementation was extremely helpful.  We discussed potentially repeating the steroid injection which patient declined today.  Continuing to have instability of the knee even in the custom OA stability brace for patient has following.  In addition of this affecting daily activities where patient is unable to even go up or down stairs secondary to the discomfort and instability of the knee.  Patient has done very well with the weight loss and has a BMI at 41, discussed needs to get less than 40 and ideally 35.  Do want to refer patient to orthopedic surgery to discuss other treatment options.  Follow-up with me again as needed.  Total time with discussing all of this, independently visualizing patient's x-rays, as well as discussing with family member 36 minutes

## 2024-03-29 NOTE — Progress Notes (Signed)
 [facility] [address] [place] [address] Phone: [phone] Subjective:   Anita Pacheco, am serving as a scribe for Dr. Arthea Pacheco.  I'm seeing this patient by the request  of:  Osei-Bonsu, Zamantha Strebel, MD  CC: Right knee pain.  YEP:Dlagzrupcz  Anita Pacheco is a 53 y.o. female coming in with complaint of right knee pain.  Known arthritic changes.  Past medical history significant for HIV as well as chronic pain syndrome.  Has been seeing pain medicine.  Has prescription for tramadol  and naltrexone  per most recent note has discontinued the naltrexone  and started on the tramadol .  Patient unfortunately has had increasing in her RNA testing for her HIV. Knee has been locking and she is falling on level ground.        Past Medical History:  Diagnosis Date   ANXIETY 07/22/2006   ASTHMA 07/22/2006   Breast mass 04/25/2011   Carpal tunnel syndrome 07/22/2006   HERPES ZOSTER 05/28/2010   HIV (human immunodeficiency virus infection) (HCC)    HYPERTENSION 07/22/2006   PERIPHERAL NEUROPATHY 07/03/2008   RLS (restless legs syndrome)    Past Surgical History:  Procedure Laterality Date   CESAREAN SECTION     ENDOVENOUS ABLATION SAPHENOUS VEIN W/ LASER Right 12/22/2017   endovenous laser ablation right greater saphenous vein by Lynwood Collum MD   STAPEDES SURGERY Right    Social History   Socioeconomic History   Marital status: Single    Spouse name: Not on file   Number of children: 3   Years of education: Not on file   Highest education level: Not on file  Occupational History   Occupation: med tech  Tobacco Use   Smoking status: Never   Smokeless tobacco: Never  Vaping Use   Vaping status: Never Used  Substance and Sexual Activity   Alcohol use: Yes    Comment: holidays   Drug use: No   Sexual activity: Not Currently    Comment: pt. declined condoms  Other Topics Concern   Not on file  Social History Narrative   Not on file    Social Drivers of Health   Financial Resource Strain: Not on file  Food Insecurity: Not on file  Transportation Needs: Not on file  Physical Activity: Not on file  Stress: Not on file  Social Connections: Not on file   Allergies  Allergen Reactions   Hydrocodone Hives and Itching    Excessive vomiting   Prednisone Itching    Given for itching and made itching worse    Enalapril  Rash   Family History  Problem Relation Age of Onset   CAD Mother        cardiomyopathy   GER disease Mother    Hypertension Mother    Pancreatic cancer Father        mets to colon  and liver   CAD Father    Throat cancer Paternal Grandmother    Asthma Daughter    Asthma Daughter    Lymphoma Maternal Aunt    Ovarian cancer Maternal Aunt    Kidney disease Son      Current Outpatient Medications (Cardiovascular):    amLODipine  (NORVASC ) 10 MG tablet, Take 10 mg by mouth daily.   atenolol -chlorthalidone  (TENORETIC ) 100-25 MG tablet, Take 1 tablet by mouth daily.  Current Outpatient Medications (Respiratory):    albuterol  (VENTOLIN  HFA) 108 (90 Base) MCG/ACT inhaler, INHALE 2 PUFFS INTO THE LUNGS EVERY 6 HOURS AS NEEDED FOR WHEEZING  OR SHORTNESS OF BREATH   DULERA 200-5 MCG/ACT AERO, Inhale 2 puffs into the lungs 2 (two) times daily.   fluticasone  (FLONASE ) 50 MCG/ACT nasal spray, Place 1 spray into both nostrils daily as needed for allergies or rhinitis.   Fluticasone -Salmeterol 113-14 MCG/ACT AEPB, Inhale 1 Dose into the lungs daily.   levocetirizine (XYZAL ) 5 MG tablet, TAKE ONE TABLET EVERY EVENING   sodium chloride  (OCEAN) 0.65 % nasal spray, Place 1 spray into the nose as needed for congestion.  Current Outpatient Medications (Analgesics):    aspirin EC 81 MG tablet, Take 81 mg by mouth daily. Swallow whole.   meloxicam  (MOBIC ) 15 MG tablet, 1 tab(s) orally once a day for 30 day(s)   traMADol  (ULTRAM ) 50 MG tablet, Take 1 tablet (50 mg total) by mouth 2 (two) times daily.  Current  Outpatient Medications (Hematological):    ferrous sulfate  300 (60 Fe) MG/5ML syrup, Take 5 mLs (300 mg total) by mouth daily.  Current Outpatient Medications (Other):    cholecalciferol (VITAMIN D3) 25 MCG (1000 UNIT) tablet, Take 1,000 Units by mouth daily.   diclofenac  sodium (VOLTAREN ) 1 % GEL, Apply 1 application topically 4 (four) times daily.   docusate sodium  (COLACE) 100 MG capsule, Take 1 capsule (100 mg total) by mouth 2 (two) times daily.   DOVATO  50-300 MG tablet, TAKE 1 TABLET BY MOUTH DAILY   omeprazole  (PRILOSEC) 40 MG capsule, TAKE 1 CAPSULE(40 MG) BY MOUTH DAILY   pregabalin  (LYRICA ) 100 MG capsule, Take 100 mg by mouth 2 (two) times daily.   Reviewed prior external information including notes and imaging from  primary care provider As well as notes that were available from care everywhere and other healthcare systems.  Past medical history, social, surgical and family history all reviewed in electronic medical record.  No pertanent information unless stated regarding to the chief complaint.   Review of Systems:  No headache, visual changes, nausea, vomiting, diarrhea, constipation, dizziness, abdominal pain, skin rash, fevers, chills, night sweats, weight loss, swollen lymph nodes, body aches, chest pain, shortness of breath, mood changes. POSITIVE muscle aches, joint swelling  Objective  Blood pressure 132/88, pulse 78, height 5' 7 (1.702 m), weight 262 lb (118.8 kg), SpO2 97%.   General: No apparent distress alert and oriented x3 mood and affect normal, dressed appropriately.  HEENT: Pupils equal, extraocular movements intact  Respiratory: Patient's speak in full sentences and does not appear short of breath  Cardiovascular: No lower extremity edema, non tender, no erythema  Knee exam shows arthritic changes noted.  Patient does have a effusion noted and limited range of motion of the knee compared to the contralateral side.  Severely antalgic gait noted.      Impression and Recommendations:    The above documentation has been reviewed and is accurate and complete Anita Lobue M Hang Ammon, DO

## 2024-03-29 NOTE — Patient Instructions (Addendum)
 Good to see you. Referral to Dr. Vernetta. Keep me posted on what they say. See us  if you need us .

## 2024-04-07 ENCOUNTER — Ambulatory Visit: Admitting: Family Medicine

## 2024-04-13 ENCOUNTER — Ambulatory Visit: Attending: Nurse Practitioner | Admitting: Nurse Practitioner

## 2024-04-13 ENCOUNTER — Encounter: Payer: Self-pay | Admitting: Nurse Practitioner

## 2024-04-13 ENCOUNTER — Telehealth: Payer: Self-pay

## 2024-04-13 VITALS — BP 152/99 | HR 78 | Temp 98.2°F | Resp 18 | Ht 67.0 in | Wt 247.0 lb

## 2024-04-13 DIAGNOSIS — M1711 Unilateral primary osteoarthritis, right knee: Secondary | ICD-10-CM | POA: Insufficient documentation

## 2024-04-13 DIAGNOSIS — G894 Chronic pain syndrome: Secondary | ICD-10-CM | POA: Insufficient documentation

## 2024-04-13 DIAGNOSIS — Z79899 Other long term (current) drug therapy: Secondary | ICD-10-CM | POA: Insufficient documentation

## 2024-04-13 DIAGNOSIS — B20 Human immunodeficiency virus [HIV] disease: Secondary | ICD-10-CM | POA: Diagnosis present

## 2024-04-13 DIAGNOSIS — B0229 Other postherpetic nervous system involvement: Secondary | ICD-10-CM | POA: Insufficient documentation

## 2024-04-13 NOTE — Progress Notes (Signed)
 Nursing Pain Medication Assessment:  Safety precautions to be maintained throughout the outpatient stay will include: orient to surroundings, keep bed in low position, maintain call bell within reach at all times, provide assistance with transfer out of bed and ambulation.  Medication Inspection Compliance: Anita Pacheco did not comply with our request to bring her pills to be counted. She was reminded that bringing the medication bottles, even when empty, is a requirement.  Medication: None brought in. Pill/Patch Count: None available to be counted. Bottle Appearance: No container available. Did not bring bottle(s) to appointment. Filled Date: N/A Last Medication intake:  Yesterday

## 2024-04-13 NOTE — Progress Notes (Signed)
 PROVIDER NOTE: Interpretation of information contained herein should be left to medically-trained personnel. Specific patient instructions are provided elsewhere under Patient Instructions section of medical record. This document was created in part using AI and STT-dictation technology, any transcriptional errors that may result from this process are unintentional.  Patient: Anita Pacheco  Service: E/M   PCP: Catalina Bare, MD  DOB: Apr 08, 1971  DOS: 04/13/2024  Provider: Emmy MARLA Blanch, NP  MRN: [id number]  Delivery: Face-to-face  Specialty: Interventional Pain Management  Type: Established Patient  Setting: Ambulatory outpatient facility  Specialty designation: 09  Referring Prov.: Catalina Bare, MD  Location: Outpatient office facility       History of present illness (HPI) [name], a 53 y.o. year old female, is here today because of her right knee pain, ankle pain. [name] primary complain today is Knee Pain (Right knee and Ankle pain)  Pertinent problems: [name]  has Human immunodeficiency virus (HIV) disease (HCC); Postherpetic neuralgia; HERPES ZOSTER; Obesity; Anxiety state; Carpal tunnel syndrome; Hereditary and idiopathic peripheral neuropathy; Degenerative arthritis of right knee; Other (Generalized joint pain ? Fibromyalgia ); and Chronic pain syndrome on their pertinent problem list.  Pain Assessment: Severity of Chronic pain is reported as a 5 /10. Location: Knee Right/Down right knee and ankle. Onset: More than a month ago. Quality: Aching. Timing: Intermittent. Modifying factor(s): Medication. Vitals:  height is 5' 7 (1.702 m) and weight is 247 lb (112 kg). Her temporal temperature is 98.2 F (36.8 C). Her blood pressure is 152/99 (abnormal) and her pulse is 78. Her respiration is 18 and oxygen saturation is 100%.  BMI: Estimated body mass index is 38.69 kg/m as calculated from the following:   Height as of this encounter: 5' 7 (1.702 m).    Weight as of this encounter: 247 lb (112 kg).  Last encounter: 03/17/2024. Last procedure: Visit date not found.  Reason for encounter: medication management.   Discussed the use of AI scribe software for clinical note transcription with the patient, who gave verbal consent to proceed.  History of Present Illness   Anita Pacheco is a 53 year old female who presents with concerns regarding her tramadol  prescription and its supply.  The patient continues to experience right knee and right ankle pain.  She reports that tramadol  50 mg twice daily helps manage her pain and maintain functional mobility.  She presents with out of her pill bottle.  According to her prescription monitoring drug program (PMP) report, she received only a 5-day supply; however, she states that she had prescription for 30 days.  I advised the patient to contact her pharmacy for clarification and to bring her pill bottle to the next visit.  No medications will be prescribed at this visit until clarification with the pharmacy is obtained.  She is concerned about the supply of her tramadol  prescription. She received a bottle with thirty pills, which she believed to be a thirty-day supply. However, there is confusion as she was informed that it might only be a five-day supply. She signed for the medication due to its narcotic nature.  She recalls being told that she could take two pills a day, but she does not always take the full dose. She has been using the medication as needed for pain management. There is uncertainty about whether she received the full prescription as intended.    Pharmacotherapy Assessment   Tramadol  (Ultram ) 50 mg tablet twice daily as needed for pain. Monitoring: [place] PMP: PDMP reviewed  during this encounter.       Pharmacotherapy: No side-effects or adverse reactions reported. Compliance: No problems identified. Effectiveness: Clinically acceptable.  Anita Pacheco, [place]  04/13/2024 12:02 PM  Sign  when Signing Visit Nursing Pain Medication Assessment:  Safety precautions to be maintained throughout the outpatient stay will include: orient to surroundings, keep bed in low position, maintain call bell within reach at all times, provide assistance with transfer out of bed and ambulation.  Medication Inspection Compliance: Ms. Errico did not comply with our request to bring her pills to be counted. She was reminded that bringing the medication bottles, even when empty, is a requirement.  Medication: None brought in. Pill/Patch Count: None available to be counted. Bottle Appearance: No container available. Did not bring bottle(s) to appointment. Filled Date: N/A Last Medication intake:  Yesterday    UDS:  Summary  Date Value Ref Range Status  02/02/2024 FINAL  Final    Comment:    ==================================================================== Compliance Drug Analysis, Ur ==================================================================== Test                             Result       Flag       Units  Drug Absent but Declared for Prescription Verification   Pregabalin                      Not Detected UNEXPECTED   Diclofenac                      Not Detected UNEXPECTED    Diclofenac , as indicated in the declared medication list, is not    always detected even when used as directed.    Salicylate                     Not Detected UNEXPECTED    Aspirin, as indicated in the declared medication list, is not always    detected even when used as directed.    Atenolol                        Not Detected UNEXPECTED ==================================================================== Test                      Result    Flag   Units      Ref Range   Creatinine              355              mg/dL      >=79 ==================================================================== Declared Medications:  The flagging and interpretation on this report are based on the  following declared  medications.  Unexpected results may arise from  inaccuracies in the declared medications.   **Note: The testing scope of this panel includes these medications:   Atenolol  (Tenoretic )  Pregabalin  (Lyrica )   **Note: The testing scope of this panel does not include small to  moderate amounts of these reported medications:   Aspirin  Diclofenac  (Voltaren )   **Note: The testing scope of this panel does not include the  following reported medications:   Albuterol  (Ventolin  HFA)  Amlodipine  (Norvasc )  Chlorthalidone  (Tenoretic )  Docusate (Colace)  Dolutegravir (Dovato )  Fluticasone  (Flonase )  Formoterol  (Dulera)  Iron   Lamivudine (Dovato )  Levocetirizine (Xyzal )  Meloxicam  (Mobic )  Mometasone  (Dulera)  Omeprazole  (Prilosec)  Salmeterol  Sodium Chloride   Vitamin D3 ==================================================================== For clinical  consultation, please call [phone]. ====================================================================     No results found for: CBDTHCR No results found for: D8THCCBX No results found for: D9THCCBX  ROS  Constitutional: Denies any fever or chills Gastrointestinal: No reported hemesis, hematochezia, vomiting, or acute GI distress Musculoskeletal: Right hip pain, ankle pain (right) Neurological: No reported episodes of acute onset apraxia, aphasia, dysarthria, agnosia, amnesia, paralysis, loss of coordination, or loss of consciousness  Medication Review  Fluticasone -Salmeterol, albuterol , amLODipine , aspirin EC, atenolol -chlorthalidone , cholecalciferol, diclofenac  sodium, docusate sodium , dolutegravir-lamiVUDine, ferrous sulfate , fluticasone , levocetirizine, meloxicam , mometasone -formoterol , omeprazole , pregabalin , sodium chloride , and traMADol   History Review  Allergy: Ms. Koons is allergic to hydrocodone, prednisone, and enalapril . Drug: Ms. Nichelson  reports no history of drug use. Alcohol:  reports current  alcohol use. Tobacco:  reports that she has never smoked. She has never used smokeless tobacco. Social: Ms. Warwick  reports that she has never smoked. She has never used smokeless tobacco. She reports current alcohol use. She reports that she does not use drugs. Medical:  has a past medical history of ANXIETY (07/22/2006), ASTHMA (07/22/2006), Breast mass (04/25/2011), Carpal tunnel syndrome (07/22/2006), HERPES ZOSTER (05/28/2010), HIV (human immunodeficiency virus infection) (HCC), HYPERTENSION (07/22/2006), PERIPHERAL NEUROPATHY (07/03/2008), and RLS (restless legs syndrome). Surgical: Ms. Deignan  has a past surgical history that includes Stapedes surgery (Right); Endovenous ablation saphenous vein w/ laser (Right, 12/22/2017); and Cesarean section. Family: family history includes Asthma in her daughter and daughter; CAD in her father and mother; GER disease in her mother; Hypertension in her mother; Kidney disease in her son; Lymphoma in her maternal aunt; Ovarian cancer in her maternal aunt; Pancreatic cancer in her father; Throat cancer in her paternal grandmother.  Laboratory Chemistry Profile   Renal Lab Results  Component Value Date   BUN 15 08/05/2023   CREATININE 0.78 08/05/2023   LABCREA 208 09/06/2019   BCR SEE NOTE: 08/05/2023   GFRAA 105 02/14/2020   GFRNONAA 91 02/14/2020    Hepatic Lab Results  Component Value Date   AST 15 08/05/2023   ALT 10 08/05/2023   ALBUMIN 3.7 12/03/2016   ALKPHOS 58 12/03/2016   HCVAB NO 08/24/2006    Electrolytes Lab Results  Component Value Date   NA 139 08/05/2023   K 3.5 08/05/2023   CL 104 08/05/2023   CALCIUM 8.9 08/05/2023    Bone No results found for: VD25OH, VD125OH2TOT, CI6874NY7, CI7874NY7, 25OHVITD1, 25OHVITD2, 25OHVITD3, TESTOFREE, TESTOSTERONE  Inflammation (CRP: Acute Phase) (ESR: Chronic Phase) No results found for: CRP, ESRSEDRATE, LATICACIDVEN       Note: Above Lab results  reviewed.  Recent Imaging Review  US  LIMITED JOINT SPACE STRUCTURES LOW RIGHT(NO LINKED CHARGES) No images saved  Note: Reviewed        Physical Exam  Vitals: BP (!) 152/99 (BP Location: Right Arm, Patient Position: Sitting, Cuff Size: Normal)   Pulse 78   Temp 98.2 F (36.8 C) (Temporal)   Resp 18   Ht 5' 7 (1.702 m)   Wt 247 lb (112 kg)   SpO2 100%   BMI 38.69 kg/m  BMI: Estimated body mass index is 38.69 kg/m as calculated from the following:   Height as of this encounter: 5' 7 (1.702 m).   Weight as of this encounter: 247 lb (112 kg). Ideal: Ideal body weight: 61.6 kg (135 lb 12.9 oz) Adjusted ideal body weight: 81.8 kg (180 lb 4.5 oz) General appearance: Well nourished, well developed, and well hydrated. In no apparent acute distress Mental status: Alert, oriented x 3 (  person, place, & time)       Respiratory: No evidence of acute respiratory distress Eyes: PERLA  Musculoskeletal: Right knee pain, right ankle pain worse with walking, prolonged standing and weightbearing Assessment   Diagnosis Status  1. Chronic pain syndrome   2. Postherpetic neuralgia   3. Primary osteoarthritis of right knee   4. Medication management   5. Human immunodeficiency virus (HIV) disease (HCC)    Controlled Controlled Controlled   Updated Problems: No problems updated.  Plan of Care  Problem-specific:  Assessment and Plan    Chronic pain management with tramadol  Discrepancy in tramadol  prescription supply noted. Effectiveness acknowledged, but quantity and duration require confirmation. - Verify tramadol  prescription quantity with pharmacy. - Instruct her to bring medication bottle to next appointment for verification. - Prescribe one-month supply of tramadol  once details confirmed.       Ms. Tanayah Bonn has a current medication list which includes the following long-term medication(s): albuterol , ferrous sulfate , fluticasone , fluticasone -salmeterol, levocetirizine,  omeprazole , pregabalin , and sodium chloride .  Pharmacotherapy (Medications Ordered): No orders of the defined types were placed in this encounter.  Orders:  No orders of the defined types were placed in this encounter.       Return in about 1 month (around 05/14/2024) for (F2F), (MM), Emmy Blanch NP.    Recent Visits Date Type Provider Dept  03/17/24 Office Visit Cassey Hurrell K, NP [facility]  02/16/24 Office Visit Amato Sevillano K, NP [facility]  02/02/24 Office Visit Marcelino Nurse, MD [facility]  Showing recent visits within past 90 days and meeting all other requirements Today's Visits Date Type Provider Dept  04/13/24 Office Visit Zeba Luby K, NP [facility]  Showing today's visits and meeting all other requirements Future Appointments Date Type Provider Dept  05/11/24 Appointment Nasir Bright K, NP [facility]  Showing future appointments within next 90 days and meeting all other requirements  I discussed the assessment and treatment plan with the patient. The patient was provided an opportunity to ask questions and all were answered. The patient agreed with the plan and demonstrated an understanding of the instructions.  Patient advised to call back or seek an in-person evaluation if the symptoms or condition worsens.  I personally spent a total of 10 minutes in the care of the patient today including preparing to see the patient, getting/reviewing separately obtained history, performing a medically appropriate exam/evaluation, counseling and educating, placing orders, referring and communicating with other health care professionals, documenting clinical information in the EHR, independently interpreting results, communicating results, and coordinating care.   Note by: Tsuneo Faison K Nitya Cauthon, NP (TTS and AI technology used. I apologize for any typographical errors that were not detected and corrected.) Date: 04/13/2024; Time: 1:51  PM

## 2024-04-13 NOTE — Telephone Encounter (Signed)
 Telephone call from patient, she confirmed with pharmacy, and she did only receive a 5 day fill of the Tramadol  from the pharmacy. Her insurance would only cover a 5 day initial fill and will need a PA.

## 2024-04-25 ENCOUNTER — Other Ambulatory Visit: Payer: Self-pay | Admitting: Infectious Diseases

## 2024-04-25 DIAGNOSIS — B20 Human immunodeficiency virus [HIV] disease: Secondary | ICD-10-CM

## 2024-05-02 ENCOUNTER — Other Ambulatory Visit: Payer: Self-pay

## 2024-05-02 ENCOUNTER — Encounter: Payer: Self-pay | Admitting: Radiology

## 2024-05-02 ENCOUNTER — Ambulatory Visit: Admitting: Orthopaedic Surgery

## 2024-05-02 VITALS — Wt 258.0 lb

## 2024-05-02 DIAGNOSIS — G8929 Other chronic pain: Secondary | ICD-10-CM

## 2024-05-02 DIAGNOSIS — M1711 Unilateral primary osteoarthritis, right knee: Secondary | ICD-10-CM

## 2024-05-02 DIAGNOSIS — M25561 Pain in right knee: Secondary | ICD-10-CM | POA: Diagnosis not present
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
## 2024-05-10 ENCOUNTER — Other Ambulatory Visit: Payer: Self-pay

## 2024-05-10 ENCOUNTER — Ambulatory Visit: Admitting: Infectious Diseases

## 2024-05-10 ENCOUNTER — Encounter: Payer: Self-pay | Admitting: Infectious Diseases

## 2024-05-10 VITALS — BP 125/83 | HR 72 | Temp 98.2°F | Ht 67.0 in | Wt 255.0 lb

## 2024-05-10 DIAGNOSIS — Z23 Encounter for immunization: Secondary | ICD-10-CM | POA: Diagnosis not present

## 2024-05-10 DIAGNOSIS — B20 Human immunodeficiency virus [HIV] disease: Secondary | ICD-10-CM | POA: Diagnosis present

## 2024-05-10 DIAGNOSIS — Z79899 Other long term (current) drug therapy: Secondary | ICD-10-CM

## 2024-05-10 DIAGNOSIS — Z7185 Encounter for immunization safety counseling: Secondary | ICD-10-CM

## 2024-05-10 MED ORDER — DOVATO 50-300 MG PO TABS: 1.0000 | ORAL_TABLET | Freq: Every day | ORAL | 11 refills | Status: AC

## 2024-05-10 MED ORDER — ZOSTER VAC RECOMB ADJUVANTED 50 MCG/0.5ML IM SUSR: 0.5000 mL | Freq: Once | INTRAMUSCULAR | 0 refills | Status: AC

## 2024-05-10 NOTE — Progress Notes (Unsigned)
 Name: Anita Pacheco  DOB: 1971/01/07 MRN: [id number] PCP: Catalina Bare, MD    Subjective:   Chief Complaint  Patient presents with   Follow-up      HPI: Anita Pacheco is a 53 y.o. female with well controlled HIV, working with [facility] clinic since 2008.   Previous Regimens Include: Atripla Complera Dovato  09-2021   Genotype:   Subjective   Discussed the use of AI scribe software for clinical note transcription with the patient, who gave verbal consent to proceed.  History of Present Illness   Anita Pacheco is a 53 year old female with HIV who presents for routine follow-up care.  She continues to take Dovato  daily for her HIV management. Her last office visit was approximately ten months ago, in February, which coincides with her birthday month.  She is experiencing hip and knee pain and has been consulting with Dr. Vernetta and Dr. Claudene in sports medicine and orthopedic surgery. She reports that her knee feels like the bones are 'slipping'.  She requires a referral to a new rheumatologist due to insurance issues with her current provider. She is currently seeing a pain management specialist and has been diagnosed with fibromyalgia.  She is due for a mammogram and has been in contact with the breast center for scheduling. She also discussed the need for a handicap placard, which requires paperwork from the [facility].  Regarding vaccinations, she has had one dose of the Shingrix  vaccine and is due for a second. She has had five doses of the COVID vaccine and is due for a pneumonia vaccine. She is also considering a meningitis vaccine.  She is concerned about potential side effects from vaccines, particularly the COVID booster, and is seeking guidance on what is best for her.           05/11/2024   11:29 AM  Depression screen PHQ 2/9  Decreased Interest 0  Down, Depressed, Hopeless 0  PHQ - 2 Score 0    Review of Systems  Constitutional:  Negative for  chills and fever.  HENT:  Negative for tinnitus.   Eyes:  Negative for blurred vision and photophobia.  Respiratory:  Negative for cough and sputum production.   Cardiovascular:  Negative for chest pain.  Gastrointestinal:  Negative for diarrhea, nausea and vomiting.  Genitourinary:  Negative for dysuria.  Musculoskeletal:  Positive for joint pain (right).  Skin:  Negative for rash.  Neurological:  Negative for headaches.     Past Medical History:  Diagnosis Date   ANXIETY 07/22/2006   ASTHMA 07/22/2006   Breast mass 04/25/2011   Carpal tunnel syndrome 07/22/2006   HERPES ZOSTER 05/28/2010   HIV (human immunodeficiency virus infection) (HCC)    HYPERTENSION 07/22/2006   PERIPHERAL NEUROPATHY 07/03/2008   RLS (restless legs syndrome)     Outpatient Medications Prior to Visit  Medication Sig Dispense Refill   albuterol  (VENTOLIN  HFA) 108 (90 Base) MCG/ACT inhaler INHALE 2 PUFFS INTO THE LUNGS EVERY 6 HOURS AS NEEDED FOR WHEEZING OR SHORTNESS OF BREATH 6.7 g 1   Ascorbic Acid (VITAMIN C PO) Take by mouth.     aspirin EC 81 MG tablet Take 81 mg by mouth daily. Swallow whole.     atenolol -chlorthalidone  (TENORETIC ) 100-25 MG tablet Take 1 tablet by mouth daily.     B Complex Vitamins (VITAMIN B-COMPLEX PO) Take by mouth.     cholecalciferol (VITAMIN D3) 25 MCG (1000 UNIT) tablet Take 1,000 Units by mouth daily.  COD LIVER OIL PO Take by mouth.     diclofenac  sodium (VOLTAREN ) 1 % GEL Apply 1 application topically 4 (four) times daily. 100 g 6   docusate sodium  (COLACE) 100 MG capsule Take 1 capsule (100 mg total) by mouth 2 (two) times daily. 180 capsule 3   DULERA 200-5 MCG/ACT AERO Inhale 2 puffs into the lungs 2 (two) times daily.     ferrous sulfate  300 (60 Fe) MG/5ML syrup Take 5 mLs (300 mg total) by mouth daily. 150 mL 3   fluticasone  (FLONASE ) 50 MCG/ACT nasal spray Place 1 spray into both nostrils daily as needed for allergies or rhinitis. 16 g 3    Fluticasone -Salmeterol 113-14 MCG/ACT AEPB Inhale 1 Dose into the lungs daily. 1 each 3   levocetirizine (XYZAL ) 5 MG tablet TAKE ONE TABLET EVERY EVENING 31 tablet 0   MAGNESIUM PO Take by mouth.     meloxicam  (MOBIC ) 15 MG tablet 1 tab(s) orally once a day for 30 day(s)     Multiple Vitamins-Minerals (ZINC PO) Take by mouth 3 (three) times a week.     omeprazole  (PRILOSEC) 40 MG capsule TAKE 1 CAPSULE(40 MG) BY MOUTH DAILY 30 capsule 3   pregabalin  (LYRICA ) 100 MG capsule Take 100 mg by mouth 2 (two) times daily.     rosuvastatin (CRESTOR) 10 MG tablet Take 10 mg by mouth daily.     sodium chloride  (OCEAN) 0.65 % nasal spray Place 1 spray into the nose as needed for congestion. 15 mL 12   VITAMIN A PO Take by mouth.     VITAMIN E PO Take by mouth.     DOVATO  50-300 MG tablet TAKE 1 TABLET BY MOUTH DAILY 30 tablet 5   traMADol  (ULTRAM ) 50 MG tablet Take 50 mg by mouth 2 (two) times daily as needed.     amLODipine  (NORVASC ) 10 MG tablet Take 10 mg by mouth daily.     No facility-administered medications prior to visit.     Allergies  Allergen Reactions   Hydrocodone Hives and Itching    Excessive vomiting   Prednisone Itching    Given for itching and made itching worse    Enalapril  Rash    Social History   Tobacco Use   Smoking status: Never   Smokeless tobacco: Never  Vaping Use   Vaping status: Never Used  Substance Use Topics   Alcohol use: Not Currently    Comment: holidays   Drug use: No     Social History   Substance and Sexual Activity  Sexual Activity Not Currently   Comment: pt. declined condoms     Objective    Objective:   Vitals:   05/10/24 1124  BP: 125/83  Pulse: 72  Temp: 98.2 F (36.8 C)  TempSrc: Temporal  SpO2: 99%  Weight: 255 lb (115.7 kg)  Height: 5' 7 (1.702 m)     Body mass index is 39.94 kg/m.  Physical Exam Constitutional:      Appearance: Normal appearance. She is not ill-appearing.  HENT:     Mouth/Throat:      Mouth: Mucous membranes are moist.     Pharynx: Oropharynx is clear.  Eyes:     General: No scleral icterus. Cardiovascular:     Rate and Rhythm: Normal rate.  Pulmonary:     Effort: Pulmonary effort is normal.  Neurological:     Mental Status: She is oriented to person, place, and time.  Psychiatric:  Mood and Affect: Mood normal.        Thought Content: Thought content normal.     Lab Results Lab Results  Component Value Date   WBC 5.9 08/05/2023   HGB 11.1 (L) 08/05/2023   HCT 35.8 08/05/2023   MCV 85.4 08/05/2023   PLT 238 08/05/2023    Lab Results  Component Value Date   CREATININE 0.78 08/05/2023   BUN 15 08/05/2023   NA 139 08/05/2023   K 3.5 08/05/2023   CL 104 08/05/2023   CO2 27 08/05/2023    Lab Results  Component Value Date   ALT 10 08/05/2023   AST 15 08/05/2023   ALKPHOS 58 12/03/2016   BILITOT 0.4 08/05/2023    Lab Results  Component Value Date   CHOL 225 (H) 08/25/2022   HDL 51 08/25/2022   LDLCALC 140 (H) 08/25/2022   TRIG 203 (H) 08/25/2022   CHOLHDL 4.4 08/25/2022   HIV 1 RNA Quant  Date Value  08/05/2023 49 Copies/mL (H)  08/25/2022 <20 DETECTED copies/mL (A)  08/23/2021 <20 DETECTED copies/mL (A)   CD4 T Cell Abs (/uL)  Date Value  05/10/2024 464  08/05/2023 876  08/25/2022 847      Assessment & Plan:     Human immunodeficiency virus (HIV) disease - HIV is well-managed with daily Dovato . She is doing well from HIV perspective  - Continue Dovato  daily - Ordered blood work to monitor HIV status  Immunizations: COVID-19 and pneumococcal vaccines - Due for COVID-19 booster and pneumococcal vaccine. Discussed the importance of staying up-to-date with vaccinations, especially given her age and health status. Addressed concerns about side effects and reassured that vaccines are safe and can be administered together. Explained that the COVID-19 vaccine is modeled after current strains to provide updated antibody protection,  similar to the flu vaccine. The pneumococcal vaccine is a one-time dose for adults over 50. - Administered COVID-19 booster - Administered pneumococcal vaccine - Provided prescription for Shingrix  vaccine for future administration     Severe osteoarthritis, ?PMR vs Fibromyalgia -  Was working with Dr. Mai but no longer in [facility] - she is requesting referral to [facility] for assistance.   Health Maintenance -   Mammogram due - orders in    Meds ordered this encounter  Medications   Zoster Vaccine Adjuvanted (SHINGRIX ) injection    Sig: Inject 0.5 mLs into the muscle once for 1 dose.    Dispense:  1 each    Refill:  0   dolutegravir-lamiVUDine (DOVATO ) 50-300 MG tablet    Sig: Take 1 tablet by mouth daily.    Dispense:  30 tablet    Refill:  11    Prescription Type::   Renewal   Orders Placed This Encounter  Procedures   MM 3D SCREENING MAMMOGRAM BILATERAL BREAST    Standing Status:   Future    Expected Date:   05/10/2024    Expiration Date:   05/10/2025    Reason for Exam (SYMPTOM  OR DIAGNOSIS REQUIRED):   breast cancer screening    Is the patient pregnant?:   No    Preferred [facility]   Pneumococcal conjugate vaccine 20-valent   Pfizer Comirnaty Covid-19 Vaccine 67yrs & older   HIV 1 RNA quant-no reflex-bld   T-helper cells (CD4) count   Ambulatory referral to Rheumatology    Referral Priority:   Routine    Referral Type:   Consultation    Referral Reason:  Specialty Services Required    Requested Specialty:   Rheumatology    Number of Visits Requested:   1    No follow-ups on file.    Corean Fireman, MSN, NP-C [facility] Pager: [phone] Office: [phone]  05/12/24  4:13 PM

## 2024-05-10 NOTE — Patient Instructions (Addendum)
   Please call the [facility] to set up your mammogram.  [phone] [address], [address] [place], [place] [address]  Make sure you separate your Dovato  from all the supplements you take by 6 hours to make sure Dovato  is absorbed well.   I put in a referral for [facility] -  Address: [address], [place], [place] [address] Phone: [phone]  Print out for last dose of the shingles vaccine for you to take in 2-4 weeks after your current vaccines.   Meningitis vaccine (last dose) when you come back in 6 months.   Would also recommend updating your pap smear as well in 2026

## 2024-05-11 ENCOUNTER — Ambulatory Visit: Attending: Nurse Practitioner | Admitting: Nurse Practitioner

## 2024-05-11 ENCOUNTER — Encounter: Payer: Self-pay | Admitting: Nurse Practitioner

## 2024-05-11 VITALS — BP 131/108 | HR 92 | Temp 97.8°F | Resp 18 | Ht 67.0 in | Wt 244.0 lb

## 2024-05-11 DIAGNOSIS — G894 Chronic pain syndrome: Secondary | ICD-10-CM | POA: Insufficient documentation

## 2024-05-11 DIAGNOSIS — Z79899 Other long term (current) drug therapy: Secondary | ICD-10-CM | POA: Insufficient documentation

## 2024-05-11 DIAGNOSIS — B0229 Other postherpetic nervous system involvement: Secondary | ICD-10-CM | POA: Insufficient documentation

## 2024-05-11 DIAGNOSIS — M1711 Unilateral primary osteoarthritis, right knee: Secondary | ICD-10-CM | POA: Diagnosis not present

## 2024-05-11 LAB — T-HELPER CELLS (CD4) COUNT (NOT AT ARMC)
CD4 % Helper T Cell: 27 % — ABNORMAL LOW (ref 33–65)
CD4 T Cell Abs: 464 /uL (ref 400–1790)

## 2024-05-11 MED ORDER — TRAMADOL HCL 50 MG PO TABS: 50.0000 mg | ORAL_TABLET | Freq: Two times a day (BID) | ORAL | 0 refills | Status: AC | PRN

## 2024-05-11 NOTE — Progress Notes (Signed)
 Nursing Pain Medication Assessment:  Safety precautions to be maintained throughout the outpatient stay will include: orient to surroundings, keep bed in low position, maintain call bell within reach at all times, provide assistance with transfer out of bed and ambulation.  Medication Inspection Compliance: Pill count conducted under aseptic conditions, in front of the patient. Neither the pills nor the bottle was removed from the patient's sight at any time. Once count was completed pills were immediately returned to the patient in their original bottle.  Medication: Tramadol  (Ultram ) Pill/Patch Count: 5.5 of 10 pills/patches remain Pill/Patch Appearance: Markings consistent with prescribed medication Bottle Appearance: Standard pharmacy container. Clearly labeled. Filled Date: 86 / 27 / 2025 Last Medication intake:  Today

## 2024-05-11 NOTE — Progress Notes (Signed)
 PROVIDER NOTE: Interpretation of information contained herein should be left to medically-trained personnel. Specific patient instructions are provided elsewhere under Patient Instructions section of medical record. This document was created in part using AI and STT-dictation technology, any transcriptional errors that may result from this process are unintentional.  Patient: Anita Pacheco  Service: E/M   PCP: Catalina Bare, MD  DOB: 11-10-1970  DOS: 05/11/2024  Provider: Emmy MARLA Blanch, NP  MRN: [id number]  Delivery: Face-to-face  Specialty: Interventional Pain Management  Type: Established Patient  Setting: Ambulatory outpatient facility  Specialty designation: 09  Referring Prov.: Catalina Bare, MD  Location: Outpatient office facility       History of present illness (HPI) Anita Pacheco, a 53 y.o. year old female, is here today because of her Chronic pain syndrome [G89.4]. Anita Pacheco primary complain today is Neck Pain  Pertinent problems: Anita Pacheco has Human immunodeficiency virus (HIV) disease (HCC); Postherpetic neuralgia; HERPES ZOSTER; Obesity; Anxiety state; Carpal tunnel syndrome; Hereditary and idiopathic peripheral neuropathy; Degenerative arthritis of right knee; Other (Generalized joint pain ? Fibromyalgia ); and Chronic pain syndrome on their pertinent problem list.   Pain Assessment: Severity of Chronic pain is reported as a 8 /10. Location: Neck Right, Left/Radiates down to shoulders, hands, and legs. Onset: More than a month ago. Quality: Constant, Sharp. Timing: Intermittent. Modifying factor(s): Medication. Vitals:  height is 5' 7 (1.702 m) and weight is 244 lb (110.7 kg). Her temporal temperature is 97.8 F (36.6 C). Her blood pressure is 131/108 (abnormal) and her pulse is 92. Her respiration is 18 and oxygen saturation is 100%.  BMI: Estimated body mass index is 38.22 kg/m as calculated from the following:   Height as of this encounter: 5' 7 (1.702  m).   Weight as of this encounter: 244 lb (110.7 kg).  Last encounter: 04/13/2024. Last procedure: Visit date not found.  Reason for encounter: medication management.   Discussed the use of AI scribe software for clinical note transcription with the patient, who gave verbal consent to proceed.  History of Present Illness   Anita Pacheco is a 53 year old female who presents with issues related to medication authorization and supply.  The patient continues to experience right knee and right ankle pain.  She reports that tramadol  50 mg twice daily helps manage her pain and maintain functional mobility.   She is experiencing difficulties with her insurance only covering a five-day supply of her medication, despite needing a thirty-day supply. This issue has been ongoing, and she has previously waited up to two weeks for resolution.  She receives notifications in the mail whenever her medication requests are denied by her insurance. She is concerned about the potential for denial and the subsequent need to discuss alternative medications.     Pharmacotherapy Assessment   Tramadol  (Ultram ) 50 mg tablet twice daily as needed for pain. Monitoring: [place] PMP: PDMP reviewed during this encounter.       Pharmacotherapy: No side-effects or adverse reactions reported. Compliance: No problems identified. Effectiveness: Clinically acceptable.  [name], [place]  05/11/2024 11:31 AM  Sign when Signing Visit Nursing Pain Medication Assessment:  Safety precautions to be maintained throughout the outpatient stay will include: orient to surroundings, keep bed in low position, maintain call bell within reach at all times, provide assistance with transfer out of bed and ambulation.  Medication Inspection Compliance: Pill count conducted under aseptic conditions, in front of the patient. Neither the pills nor the bottle was removed  from the patient's sight at any time. Once count was completed pills were  immediately returned to the patient in their original bottle.  Medication: Tramadol  (Ultram ) Pill/Patch Count: 5.5 of 10 pills/patches remain Pill/Patch Appearance: Markings consistent with prescribed medication Bottle Appearance: Standard pharmacy container. Clearly labeled. Filled Date: 42 / 27 / 2025 Last Medication intake:  Today    UDS:  Summary  Date Value Ref Range Status  02/02/2024 FINAL  Final    Comment:    ==================================================================== Compliance Drug Analysis, Ur ==================================================================== Test                             Result       Flag       Units  Drug Absent but Declared for Prescription Verification   Pregabalin                      Not Detected UNEXPECTED   Diclofenac                      Not Detected UNEXPECTED    Diclofenac , as indicated in the declared medication list, is not    always detected even when used as directed.    Salicylate                     Not Detected UNEXPECTED    Aspirin, as indicated in the declared medication list, is not always    detected even when used as directed.    Atenolol                        Not Detected UNEXPECTED ==================================================================== Test                      Result    Flag   Units      Ref Range   Creatinine              355              mg/dL      >=79 ==================================================================== Declared Medications:  The flagging and interpretation on this report are based on the  following declared medications.  Unexpected results may arise from  inaccuracies in the declared medications.   **Note: The testing scope of this panel includes these medications:   Atenolol  (Tenoretic )  Pregabalin  (Lyrica )   **Note: The testing scope of this panel does not include small to  moderate amounts of these reported medications:   Aspirin  Diclofenac  (Voltaren )   **Note:  The testing scope of this panel does not include the  following reported medications:   Albuterol  (Ventolin  HFA)  Amlodipine  (Norvasc )  Chlorthalidone  (Tenoretic )  Docusate (Colace)  Dolutegravir (Dovato )  Fluticasone  (Flonase )  Formoterol  (Dulera)  Iron   Lamivudine (Dovato )  Levocetirizine (Xyzal )  Meloxicam  (Mobic )  Mometasone  (Dulera)  Omeprazole  (Prilosec)  Salmeterol  Sodium Chloride   Vitamin D3 ==================================================================== For clinical consultation, please call [phone]. ====================================================================     No results found for: CBDTHCR No results found for: D8THCCBX No results found for: D9THCCBX  ROS  Constitutional: Denies any fever or chills Gastrointestinal: No reported hemesis, hematochezia, vomiting, or acute GI distress Musculoskeletal: Neck pain Neurological: No reported episodes of acute onset apraxia, aphasia, dysarthria, agnosia, amnesia, paralysis, loss of coordination, or loss of consciousness  Medication Review  Ascorbic Acid, B Complex Vitamins, Cod Liver Oil, Fluticasone -Salmeterol, Magnesium, Multiple Vitamins-Minerals,  Vitamin A, Vitamin E, Zoster Vaccine Adjuvanted, albuterol , amLODipine , aspirin EC, atenolol -chlorthalidone , cholecalciferol, diclofenac  sodium, docusate sodium , dolutegravir-lamiVUDine, ferrous sulfate , fluticasone , levocetirizine, meloxicam , mometasone -formoterol , omeprazole , pregabalin , rosuvastatin, sodium chloride , and traMADol   History Review  Allergy: Anita Pacheco is allergic to hydrocodone, prednisone, and enalapril . Drug: Anita Pacheco  reports no history of drug use. Alcohol:  reports that she does not currently use alcohol. Tobacco:  reports that she has never smoked. She has never used smokeless tobacco. Social: Anita Pacheco  reports that she has never smoked. She has never used smokeless tobacco. She reports that she does not currently use  alcohol. She reports that she does not use drugs. Medical:  has a past medical history of ANXIETY (07/22/2006), ASTHMA (07/22/2006), Breast mass (04/25/2011), Carpal tunnel syndrome (07/22/2006), HERPES ZOSTER (05/28/2010), HIV (human immunodeficiency virus infection) (HCC), HYPERTENSION (07/22/2006), PERIPHERAL NEUROPATHY (07/03/2008), and RLS (restless legs syndrome). Surgical: Anita Pacheco  has a past surgical history that includes Stapedes surgery (Right); Endovenous ablation saphenous vein w/ laser (Right, 12/22/2017); and Cesarean section. Family: family history includes Asthma in her daughter and daughter; CAD in her father and mother; GER disease in her mother; Hypertension in her mother; Kidney disease in her son; Lymphoma in her maternal aunt; Ovarian cancer in her maternal aunt; Pancreatic cancer in her father; Throat cancer in her paternal grandmother.  Laboratory Chemistry Profile   Renal Lab Results  Component Value Date   BUN 15 08/05/2023   CREATININE 0.78 08/05/2023   LABCREA 208 09/06/2019   BCR SEE NOTE: 08/05/2023   GFRAA 105 02/14/2020   GFRNONAA 91 02/14/2020    Hepatic Lab Results  Component Value Date   AST 15 08/05/2023   ALT 10 08/05/2023   ALBUMIN 3.7 12/03/2016   ALKPHOS 58 12/03/2016   HCVAB NO 08/24/2006    Electrolytes Lab Results  Component Value Date   NA 139 08/05/2023   K 3.5 08/05/2023   CL 104 08/05/2023   CALCIUM 8.9 08/05/2023    Bone No results found for: VD25OH, VD125OH2TOT, CI6874NY7, CI7874NY7, 25OHVITD1, 25OHVITD2, 25OHVITD3, TESTOFREE, TESTOSTERONE  Inflammation (CRP: Acute Phase) (ESR: Chronic Phase) No results found for: CRP, ESRSEDRATE, LATICACIDVEN       Note: Above Lab results reviewed.  Recent Imaging Review  XR Knee 1-2 Views Right X-rays of the right knee show significant tricompartment arthritis. Note: Reviewed        Physical Exam  Vitals: BP (!) 131/108 (BP Location: Right Arm, Patient  Position: Sitting, Cuff Size: Normal)   Pulse 92   Temp 97.8 F (36.6 C) (Temporal)   Resp 18   Ht 5' 7 (1.702 m)   Wt 244 lb (110.7 kg)   SpO2 100%   BMI 38.22 kg/m  BMI: Estimated body mass index is 38.22 kg/m as calculated from the following:   Height as of this encounter: 5' 7 (1.702 m).   Weight as of this encounter: 244 lb (110.7 kg). Ideal: Ideal body weight: 61.6 kg (135 lb 12.9 oz) Adjusted ideal body weight: 81.2 kg (179 lb 1.3 oz) General appearance: Well nourished, well developed, and well hydrated. In no apparent acute distress Mental status: Alert, oriented x 3 (person, place, & time)       Respiratory: No evidence of acute respiratory distress Eyes: PERLA  Musculoskeletal: Neck pain Cervical Spine Area Exam  Skin & Axial Inspection: No masses, redness, edema, swelling, or associated skin lesions Alignment: Symmetrical Functional ROM: Pain restricted ROM      Stability: No instability detected Muscle Tone/Strength:  Functionally intact. No obvious neuro-muscular anomalies detected. Sensory (Neurological): Neurogenic pain pattern Palpation: No palpable anomalies             Upper Extremity (UE) Exam      Side: Right upper extremity   Side: Left upper extremity  Skin & Extremity Inspection: Skin color, temperature, and hair growth are WNL. No peripheral edema or cyanosis. No masses, redness, swelling, asymmetry, or associated skin lesions. No contractures.   Skin & Extremity Inspection: Skin color, temperature, and hair growth are WNL. No peripheral edema or cyanosis. No masses, redness, swelling, asymmetry, or associated skin lesions. No contractures.  Functional ROM: Pain restricted ROM           Functional ROM: Unrestricted ROM          Muscle Tone/Strength: Functionally intact. No obvious neuro-muscular anomalies detected.   Muscle Tone/Strength: Functionally intact. No obvious neuro-muscular anomalies detected.  Sensory (Neurological): Neurogenic pain pattern            Sensory (Neurological): Unimpaired          Palpation: No palpable anomalies               Palpation: No palpable anomalies              Provocative Test(s):  Phalen's test: deferred Tinel's test: deferred Apley's scratch test (touch opposite shoulder):  Action 1 (Across chest): deferred Action 2 (Overhead): deferred Action 3 (LB reach): deferred     Provocative Test(s):  Phalen's test: deferred Tinel's test: deferred Apley's scratch test (touch opposite shoulder):  Action 1 (Across chest): deferred Action 2 (Overhead): deferred Action 3 (LB reach): deferred     Assessment   Diagnosis Status  1. Chronic pain syndrome   2. Postherpetic neuralgia   3. Primary osteoarthritis of right knee   4. Medication management    Controlled Controlled Controlled   Updated Problems: No problems updated.  Plan of Care  Problem-specific:  Assessment and Plan    Chronic pain syndrome Managed with medication. Insurance limits prescription to five days without prior authorization. - Submitted prior authorization for 30-day prescription. - Advised to wait 12-24 hours before picking up medication to ensure prior authorization is processed. - If prior authorization is denied, will discuss alternative medication options.  Patient's pain is controlled with tramadol , will continue on current medication regimen.  Prescribing drug monitoring (PDMP) reviewed, findings consistent with the use of prescribed medication no evidence of narcotic misuse or abuse.  Drug screening (UDS) up to date consistent with the use of prescribed medication.  Schedule follow-up in 30 days for medication management.  Primary osteoarthritis of knee: The patient continues experiencing knee pain and neck pain, which is managed with tramadol .  Her recent right knee x-ray revealed significant tricompartmental arthritis.        Anita Pacheco has a current medication list which includes the following long-term  medication(s): albuterol , ferrous sulfate , fluticasone , fluticasone -salmeterol, levocetirizine, omeprazole , pregabalin , rosuvastatin, and sodium chloride .  Pharmacotherapy (Medications Ordered): Meds ordered this encounter  Medications   traMADol  (ULTRAM ) 50 MG tablet    Sig: Take 1 tablet (50 mg total) by mouth 2 (two) times daily as needed.    Dispense:  60 tablet    Refill:  0   Orders:  No orders of the defined types were placed in this encounter.       Return in about 1 month (around 06/10/2024) for (F2F), (MM), Emmy Blanch NP.    Recent Visits Date Type Provider  Dept  04/13/24 Office Visit Cleola Perryman K, NP [facility]  03/17/24 Office Visit Aelyn Stanaland K, NP [facility]  02/16/24 Office Visit Tollie Canada K, NP [facility]  Showing recent visits within past 90 days and meeting all other requirements Today's Visits Date Type Provider Dept  05/11/24 Office Visit Yameli Delamater K, NP [facility]  Showing today's visits and meeting all other requirements Future Appointments Date Type Provider Dept  06/07/24 Appointment Ata Pecha K, NP [facility]  Showing future appointments within next 90 days and meeting all other requirements  I discussed the assessment and treatment plan with the patient. The patient was provided an opportunity to ask questions and all were answered. The patient agreed with the plan and demonstrated an understanding of the instructions.  Patient advised to call back or seek an in-person evaluation if the symptoms or condition worsens.  I personally spent a total of 30 minutes in the care of the patient today including preparing to see the patient, getting/reviewing separately obtained history, performing a medically appropriate exam/evaluation, counseling and educating, placing orders, referring and communicating with other health care professionals, documenting clinical information in the EHR,  independently interpreting results, communicating results, and coordinating care.   Note by: Gerri Acre K Baley Lorimer, NP (TTS and AI technology used. I apologize for any typographical errors that were not detected and corrected.) Date: 05/11/2024; Time: 12:08 PM

## 2024-05-12 LAB — HIV-1 RNA QUANT-NO REFLEX-BLD
HIV 1 RNA Quant: <20 {copies}/mL — AB
HIV-1 RNA Quant, Log: <1.3 {Log_copies}/mL — AB

## 2024-05-13 ENCOUNTER — Ambulatory Visit: Payer: Self-pay | Admitting: Infectious Diseases

## 2024-05-23 ENCOUNTER — Telehealth: Payer: Self-pay | Admitting: Nurse Practitioner

## 2024-05-23 NOTE — Telephone Encounter (Signed)
 Spoke with [facility], gave PA information and insurance was re-ran and medication is now ready to be shipped to patient. Patient notified to call [facility] back with approval to ship.

## 2024-05-23 NOTE — Telephone Encounter (Signed)
 Pharmacy is telling the patient she can only get partial script because the insurance need PA. Her pharmacy delivered 10 pills. Telling her she will only get 10 a week. Please help patient straighted out.

## 2024-06-03 DIAGNOSIS — M1991 Primary osteoarthritis, unspecified site: Secondary | ICD-10-CM | POA: Insufficient documentation

## 2024-06-03 DIAGNOSIS — M797 Fibromyalgia: Secondary | ICD-10-CM | POA: Insufficient documentation

## 2024-06-03 NOTE — Progress Notes (Deleted)
 PROVIDER NOTE: Interpretation of information contained herein should be left to medically-trained personnel. Specific patient instructions are provided elsewhere under Patient Instructions section of medical record. This document was created in part using AI and STT-dictation technology, any transcriptional errors that may result from this process are unintentional.  Patient: Anita Pacheco  Service: E/M   PCP: Catalina Bare, MD  DOB: 04/18/1971  DOS: 06/07/2024  Provider: Emmy MARLA Blanch, NP  MRN: [id number]  Delivery: Face-to-face  Specialty: Interventional Pain Management  Type: Established Patient  Setting: Ambulatory outpatient facility  Specialty designation: 09  Referring Prov.: Catalina Bare, MD  Location: Outpatient office facility       History of present illness (HPI) [name], a 53 y.o. year old female, is here today because of her No primary diagnosis found.. [name] primary complain today is No chief complaint on file.  Pertinent problems: [name] has Human immunodeficiency virus (HIV) disease (HCC); Postherpetic neuralgia; Carpal tunnel syndrome; Abnormality of gait; Neck pain; GERD (gastroesophageal reflux disease); Unilateral primary osteoarthritis, right knee; Chronic pain syndrome; Primary osteoarthritis; and Fibromyalgia on their pertinent problem list.  Pain Assessment: Severity of   is reported as a  /10. Location:    / . Onset:  . Quality:  . Timing:  . Modifying factor(s):  SABRA Vitals:  vitals were not taken for this visit.  BMI: Estimated body mass index is 38.22 kg/m as calculated from the following:   Height as of 05/11/24: 5' 7 (1.702 m).   Weight as of 05/11/24: 244 lb (110.7 kg).  Last encounter: 05/11/2024. Last procedure: Visit date not found.  Reason for encounter:  *** .   Discussed the use of AI scribe software for clinical note transcription with the patient, who gave verbal consent to proceed.  History of Present Illness            Pharmacotherapy Assessment   Tramadol  (Ultram ) 50 mg tablet twice daily as needed for pain. Monitoring: [place] PMP: PDMP reviewed during this encounter.       Pharmacotherapy: No side-effects or adverse reactions reported. Compliance: No problems identified. Effectiveness: Clinically acceptable.  [name], [place]  05/11/2024 11:31 AM  Sign when Signing Visit Nursing Pain Medication Assessment:  Safety precautions to be maintained throughout the outpatient stay will include: orient to surroundings, keep bed in low position, maintain call bell within reach at all times, provide assistance with transfer out of bed and ambulation.  Medication Inspection Compliance: Pill count conducted under aseptic conditions, in front of the patient. Neither the pills nor the bottle was removed from the patient's sight at any time. Once count was completed pills were immediately returned to the patient in their original bottle.  Medication: Tramadol  (Ultram ) Pill/Patch Count: 5.5 of 10 pills/patches remain Pill/Patch Appearance: Markings consistent with prescribed medication Bottle Appearance: Standard pharmacy container. Clearly labeled. Filled Date: 11 / 27 / 2025 Last Medication intake:  Today    UDS:  Summary  Date Value Ref Range Status  02/02/2024 FINAL  Final    Comment:    ==================================================================== Compliance Drug Analysis, Ur ==================================================================== Test                             Result       Flag       Units  Drug Absent but Declared for Prescription Verification   Pregabalin   Not Detected UNEXPECTED   Diclofenac                      Not Detected UNEXPECTED    Diclofenac , as indicated in the declared medication list, is not    always detected even when used as directed.    Salicylate                     Not Detected UNEXPECTED    Aspirin, as indicated in the declared  medication list, is not always    detected even when used as directed.    Atenolol                        Not Detected UNEXPECTED ==================================================================== Test                      Result    Flag   Units      Ref Range   Creatinine              355              mg/dL      >=79 ==================================================================== Declared Medications:  The flagging and interpretation on this report are based on the  following declared medications.  Unexpected results may arise from  inaccuracies in the declared medications.   **Note: The testing scope of this panel includes these medications:   Atenolol  (Tenoretic )  Pregabalin  (Lyrica )   **Note: The testing scope of this panel does not include small to  moderate amounts of these reported medications:   Aspirin  Diclofenac  (Voltaren )   **Note: The testing scope of this panel does not include the  following reported medications:   Albuterol  (Ventolin  HFA)  Amlodipine  (Norvasc )  Chlorthalidone  (Tenoretic )  Docusate (Colace)  Dolutegravir (Dovato )  Fluticasone  (Flonase )  Formoterol  (Dulera)  Iron   Lamivudine (Dovato )  Levocetirizine (Xyzal )  Meloxicam  (Mobic )  Mometasone  (Dulera)  Omeprazole  (Prilosec)  Salmeterol  Sodium Chloride   Vitamin D3 ==================================================================== For clinical consultation, please call [phone]. ====================================================================     No results found for: CBDTHCR No results found for: D8THCCBX No results found for: D9THCCBX  ROS  Constitutional: Denies any fever or chills Gastrointestinal: No reported hemesis, hematochezia, vomiting, or acute GI distress Musculoskeletal: Neck pain Neurological: No reported episodes of acute onset apraxia, aphasia, dysarthria, agnosia, amnesia, paralysis, loss of coordination, or loss of consciousness  Medication  Review  Ascorbic Acid, B Complex Vitamins, Cod Liver Oil, Fluticasone -Salmeterol, Magnesium, Multiple Vitamins-Minerals, Vitamin A, Vitamin E, Zoster Vaccine Adjuvanted, albuterol , amLODipine , aspirin EC, atenolol -chlorthalidone , cholecalciferol, diclofenac  sodium, docusate sodium , dolutegravir-lamiVUDine, ferrous sulfate , fluticasone , levocetirizine, meloxicam , mometasone -formoterol , omeprazole , pregabalin , rosuvastatin, sodium chloride , and traMADol   History Review  Allergy: [name] is allergic to hydrocodone, prednisone, and enalapril . Drug: [name]  reports no history of drug use. Alcohol:  reports that she does not currently use alcohol. Tobacco:  reports that she has never smoked. She has never used smokeless tobacco. Social: [name]  reports that she has never smoked. She has never used smokeless tobacco. She reports that she does not currently use alcohol. She reports that she does not use drugs. Medical:  has a past medical history of ANXIETY (07/22/2006), ASTHMA (07/22/2006), Breast mass (04/25/2011), Carpal tunnel syndrome (07/22/2006), HERPES ZOSTER (05/28/2010), HIV (human immunodeficiency virus infection) (HCC), HYPERTENSION (07/22/2006), PERIPHERAL NEUROPATHY (07/03/2008), and RLS (restless legs syndrome). Surgical: Ms. Kauth  has a past surgical history that includes Stapedes surgery (Right);  Endovenous ablation saphenous vein w/ laser (Right, 12/22/2017); and Cesarean section. Family: family history includes Asthma in her daughter and daughter; CAD in her father and mother; GER disease in her mother; Hypertension in her mother; Kidney disease in her son; Lymphoma in her maternal aunt; Ovarian cancer in her maternal aunt; Pancreatic cancer in her father; Throat cancer in her paternal grandmother.  Laboratory Chemistry Profile   Renal Lab Results  Component Value Date   BUN 15 08/05/2023   CREATININE 0.78 08/05/2023   LABCREA 208 09/06/2019   BCR SEE NOTE: 08/05/2023    GFRAA 105 02/14/2020   GFRNONAA 91 02/14/2020    Hepatic Lab Results  Component Value Date   AST 15 08/05/2023   ALT 10 08/05/2023   ALBUMIN 3.7 12/03/2016   ALKPHOS 58 12/03/2016   HCVAB NO 08/24/2006    Electrolytes Lab Results  Component Value Date   NA 139 08/05/2023   K 3.5 08/05/2023   CL 104 08/05/2023   CALCIUM 8.9 08/05/2023    Bone No results found for: VD25OH, VD125OH2TOT, CI6874NY7, CI7874NY7, 25OHVITD1, 25OHVITD2, 25OHVITD3, TESTOFREE, TESTOSTERONE  Inflammation (CRP: Acute Phase) (ESR: Chronic Phase) No results found for: CRP, ESRSEDRATE, LATICACIDVEN       Note: Above Lab results reviewed.  Recent Imaging Review  XR Knee 1-2 Views Right X-rays of the right knee show significant tricompartment arthritis. Note: Reviewed        Physical Exam  Vitals: BP (!) 131/108 (BP Location: Right Arm, Patient Position: Sitting, Cuff Size: Normal)   Pulse 92   Temp 97.8 F (36.6 C) (Temporal)   Resp 18   Ht 5' 7 (1.702 m)   Wt 244 lb (110.7 kg)   SpO2 100%   BMI 38.22 kg/m  BMI: Estimated body mass index is 38.22 kg/m as calculated from the following:   Height as of this encounter: 5' 7 (1.702 m).   Weight as of this encounter: 244 lb (110.7 kg). Ideal: Ideal body weight: 61.6 kg (135 lb 12.9 oz) Adjusted ideal body weight: 81.2 kg (179 lb 1.3 oz) General appearance: Well nourished, well developed, and well hydrated. In no apparent acute distress Mental status: Alert, oriented x 3 (person, place, & time)       Respiratory: No evidence of acute respiratory distress Eyes: PERLA  Musculoskeletal: Neck pain Cervical Spine Area Exam  Skin & Axial Inspection: No masses, redness, edema, swelling, or associated skin lesions Alignment: Symmetrical Functional ROM: Pain restricted ROM      Stability: No instability detected Muscle Tone/Strength: Functionally intact. No obvious neuro-muscular anomalies detected. Sensory (Neurological):  Neurogenic pain pattern Palpation: No palpable anomalies             Upper Extremity (UE) Exam      Side: Right upper extremity   Side: Left upper extremity  Skin & Extremity Inspection: Skin color, temperature, and hair growth are WNL. No peripheral edema or cyanosis. No masses, redness, swelling, asymmetry, or associated skin lesions. No contractures.   Skin & Extremity Inspection: Skin color, temperature, and hair growth are WNL. No peripheral edema or cyanosis. No masses, redness, swelling, asymmetry, or associated skin lesions. No contractures.  Functional ROM: Pain restricted ROM           Functional ROM: Unrestricted ROM          Muscle Tone/Strength: Functionally intact. No obvious neuro-muscular anomalies detected.   Muscle Tone/Strength: Functionally intact. No obvious neuro-muscular anomalies detected.  Sensory (Neurological): Neurogenic pain pattern  Sensory (Neurological): Unimpaired          Palpation: No palpable anomalies               Palpation: No palpable anomalies              Provocative Test(s):  Phalen's test: deferred Tinel's test: deferred Apley's scratch test (touch opposite shoulder):  Action 1 (Across chest): deferred Action 2 (Overhead): deferred Action 3 (LB reach): deferred     Provocative Test(s):  Phalen's test: deferred Tinel's test: deferred Apley's scratch test (touch opposite shoulder):  Action 1 (Across chest): deferred Action 2 (Overhead): deferred Action 3 (LB reach): deferred     Assessment   Diagnosis Status  1. Chronic pain syndrome   2. Postherpetic neuralgia   3. Primary osteoarthritis of right knee   4. Medication management    Controlled Controlled Controlled   Updated Problems: No problems updated.  Plan of Care  Problem-specific:  Assessment and Plan    Monitoring: [place] PMP: PDMP not reviewed this encounter.       Pharmacotherapy: No side-effects or adverse reactions reported. Compliance: No problems  identified. Effectiveness: Clinically acceptable.  No notes on file  UDS:  Summary  Date Value Ref Range Status  02/02/2024 FINAL  Final    Comment:    ==================================================================== Compliance Drug Analysis, Ur ==================================================================== Test                             Result       Flag       Units  Drug Absent but Declared for Prescription Verification   Pregabalin                      Not Detected UNEXPECTED   Diclofenac                      Not Detected UNEXPECTED    Diclofenac , as indicated in the declared medication list, is not    always detected even when used as directed.    Salicylate                     Not Detected UNEXPECTED    Aspirin, as indicated in the declared medication list, is not always    detected even when used as directed.    Atenolol                        Not Detected UNEXPECTED ==================================================================== Test                      Result    Flag   Units      Ref Range   Creatinine              355              mg/dL      >=79 ==================================================================== Declared Medications:  The flagging and interpretation on this report are based on the  following declared medications.  Unexpected results may arise from  inaccuracies in the declared medications.   **Note: The testing scope of this panel includes these medications:   Atenolol  (Tenoretic )  Pregabalin  (Lyrica )   **Note: The testing scope of this panel does not include small to  moderate amounts of these reported medications:   Aspirin  Diclofenac  (Voltaren )   **Note: The testing scope of this panel does not  include the  following reported medications:   Albuterol  (Ventolin  HFA)  Amlodipine  (Norvasc )  Chlorthalidone  (Tenoretic )  Docusate (Colace)  Dolutegravir (Dovato )  Fluticasone  (Flonase )  Formoterol  (Dulera)  Iron    Lamivudine (Dovato )  Levocetirizine (Xyzal )  Meloxicam  (Mobic )  Mometasone  (Dulera)  Omeprazole  (Prilosec)  Salmeterol  Sodium Chloride   Vitamin D3 ==================================================================== For clinical consultation, please call [phone]. ====================================================================     No results found for: CBDTHCR No results found for: D8THCCBX No results found for: D9THCCBX  ROS  Constitutional: Denies any fever or chills Gastrointestinal: No reported hemesis, hematochezia, vomiting, or acute GI distress Musculoskeletal: Denies any acute onset joint swelling, redness, loss of ROM, or weakness Neurological: No reported episodes of acute onset apraxia, aphasia, dysarthria, agnosia, amnesia, paralysis, loss of coordination, or loss of consciousness  Medication Review  Ascorbic Acid, B Complex Vitamins, Cod Liver Oil, Fluticasone -Salmeterol, Magnesium, Multiple Vitamins-Minerals, Vitamin A, Vitamin E, albuterol , amLODipine , aspirin EC, atenolol -chlorthalidone , cholecalciferol, diclofenac  sodium, docusate sodium , dolutegravir-lamiVUDine, ferrous sulfate , fluticasone , levocetirizine, meloxicam , mometasone -formoterol , omeprazole , pregabalin , rosuvastatin, sodium chloride , and traMADol   History Review  Allergy: [name] is allergic to hydrocodone, prednisone, and enalapril . Drug: [name]  reports no history of drug use. Alcohol:  reports that she does not currently use alcohol. Tobacco:  reports that she has never smoked. She has never used smokeless tobacco. Social: [name]  reports that she has never smoked. She has never used smokeless tobacco. She reports that she does not currently use alcohol. She reports that she does not use drugs. Medical:  has a past medical history of ANXIETY (07/22/2006), ASTHMA (07/22/2006), Breast mass (04/25/2011), Carpal tunnel syndrome (07/22/2006), HERPES ZOSTER (05/28/2010), HIV  (human immunodeficiency virus infection) (HCC), HYPERTENSION (07/22/2006), PERIPHERAL NEUROPATHY (07/03/2008), and RLS (restless legs syndrome). Surgical: [name]  has a past surgical history that includes Stapedes surgery (Right); Endovenous ablation saphenous vein w/ laser (Right, 12/22/2017); and Cesarean section. Family: family history includes Asthma in her daughter and daughter; CAD in her father and mother; GER disease in her mother; Hypertension in her mother; Kidney disease in her son; Lymphoma in her maternal aunt; Ovarian cancer in her maternal aunt; Pancreatic cancer in her father; Throat cancer in her paternal grandmother.  Laboratory Chemistry Profile   Renal Lab Results  Component Value Date   BUN 15 08/05/2023   CREATININE 0.78 08/05/2023   LABCREA 208 09/06/2019   BCR SEE NOTE: 08/05/2023   GFRAA 105 02/14/2020   GFRNONAA 91 02/14/2020    Hepatic Lab Results  Component Value Date   AST 15 08/05/2023   ALT 10 08/05/2023   ALBUMIN 3.7 12/03/2016   ALKPHOS 58 12/03/2016   HCVAB NO 08/24/2006    Electrolytes Lab Results  Component Value Date   NA 139 08/05/2023   K 3.5 08/05/2023   CL 104 08/05/2023   CALCIUM 8.9 08/05/2023    Bone No results found for: VD25OH, VD125OH2TOT, CI6874NY7, CI7874NY7, 25OHVITD1, 25OHVITD2, 25OHVITD3, TESTOFREE, TESTOSTERONE  Inflammation (CRP: Acute Phase) (ESR: Chronic Phase) No results found for: CRP, ESRSEDRATE, LATICACIDVEN       Note: Above Lab results reviewed.  Recent Imaging Review  XR Knee 1-2 Views Right X-rays of the right knee show significant tricompartment arthritis. Note: Reviewed        Physical Exam  Vitals: There were no vitals taken for this visit. BMI: Estimated body mass index is 38.22 kg/m as calculated from the following:   Height as of 05/11/24: 5' 7 (1.702 m).   Weight as of 05/11/24: 244 lb (110.7  kg). Ideal: Patient weight not recorded General appearance: Well nourished,  well developed, and well hydrated. In no apparent acute distress Mental status: Alert, oriented x 3 (person, place, & time)       Respiratory: No evidence of acute respiratory distress Eyes: PERLA   Assessment   Diagnosis Status  No diagnosis found. Controlled Controlled Controlled   Updated Problems: Problem  Primary Osteoarthritis  Fibromyalgia  Carpal Tunnel Syndrome   Qualifier: Diagnosis of  By: Eben MD, Re[place] of Care  Problem-specific:  Assessment and Plan            [name] has a current medication list which includes the following long-term medication(s): albuterol , ferrous sulfate , fluticasone , fluticasone -salmeterol, levocetirizine, omeprazole , pregabalin , rosuvastatin, and sodium chloride .  Pharmacotherapy (Medications Ordered): No orders of the defined types were placed in this encounter.  Orders:  No orders of the defined types were placed in this encounter.    {There is no content from the last Plan section.}   No follow-ups on file.    Recent Visits Date Type Provider Dept  05/11/24 Office Visit Aedyn Mckeon K, NP [facility]  04/13/24 Office Visit Ineta Sinning K, NP [facility]  03/17/24 Office Visit Maris Bena K, NP [facility]  Showing recent visits within past 90 days and meeting all other requirements Future Appointments Date Type Provider Dept  06/07/24 Appointment Kimika Streater K, NP [facility]  Showing future appointments within next 90 days and meeting all other requirements  I discussed the assessment and treatment plan with the patient. The patient was provided an opportunity to ask questions and all were answered. The patient agreed with the plan and demonstrated an understanding of the instructions.  Patient advised to call back or seek an in-person evaluation if the symptoms or condition worsens.  Duration of encounter: *** minutes.  Total time on encounter,  as per AMA guidelines included both the face-to-face and non-face-to-face time personally spent by the physician and/or other qualified health care professional(s) on the day of the encounter (includes time in activities that require the physician or other qualified health care professional and does not include time in activities normally performed by clinical staff). Physician's time may include the following activities when performed: Preparing to see the patient (e.g., pre-charting review of records, searching for previously ordered imaging, lab work, and nerve conduction tests) Review of prior analgesic pharmacotherapies. Reviewing PMP Interpreting ordered tests (e.g., lab work, imaging, nerve conduction tests) Performing post-procedure evaluations, including interpretation of diagnostic procedures Obtaining and/or reviewing separately obtained history Performing a medically appropriate examination and/or evaluation Counseling and educating the patient/family/caregiver Ordering medications, tests, or procedures Referring and communicating with other health care professionals (when not separately reported) Documenting clinical information in the electronic or other health record Independently interpreting results (not separately reported) and communicating results to the patient/ family/caregiver Care coordination (not separately reported)  Note by: Yoselin Amerman K Corby Villasenor, NP (TTS and AI technology used. I apologize for any typographical errors that were not detected and corrected.) Date: 06/07/2024; Time: 9:59 PM

## 2024-06-07 ENCOUNTER — Encounter: Admitting: Nurse Practitioner

## 2024-06-14 NOTE — Progress Notes (Unsigned)
 PROVIDER NOTE: Interpretation of information contained herein should be left to medically-trained personnel. Specific patient instructions are provided elsewhere under Patient Instructions section of medical record. This document was created in part using AI and STT-dictation technology, any transcriptional errors that may result from this process are unintentional.  Patient: Anita Pacheco  Service: E/M   PCP: Catalina Bare, MD  DOB: 1971/02/05  DOS: 06/16/2024  Provider: Emmy MARLA Blanch, NP  MRN: [id number]  Delivery: Face-to-face  Specialty: Interventional Pain Management  Type: Established Patient  Setting: Ambulatory outpatient facility  Specialty designation: 09  Referring Prov.: Catalina Bare, MD  Location: Outpatient office facility       History of present illness (HPI) Anita Pacheco, a 53 y.o. year old female, is here today because of her Chronic pain syndrome [G89.4]. Anita Pacheco primary complain today is No chief complaint on file.  Pertinent problems: Anita Pacheco has Human immunodeficiency virus (HIV) disease (HCC); Postherpetic neuralgia; Obesity; Carpal tunnel syndrome; Abnormality of gait; Neck pain; GERD (gastroesophageal reflux disease); Unilateral primary osteoarthritis, right knee; Chronic pain syndrome; Primary osteoarthritis; and Fibromyalgia on their pertinent problem list.  Pain Assessment: Severity of   is reported as a  /10. Location:    / . Onset:  . Quality:  . Timing:  . Modifying factor(s):  SABRA Vitals:  vitals were not taken for this visit.  BMI: Estimated body mass index is 38.22 kg/m as calculated from the following:   Height as of 05/11/24: 5' 7 (1.702 m).   Weight as of 05/11/24: 244 lb (110.7 kg).  Last encounter: 05/11/2024. Last procedure: Visit date not found.  Reason for encounter:  *** .   Discussed the use of AI scribe software for clinical note transcription with the patient, who gave verbal consent to proceed.  History of Present  Illness           Pharmacotherapy Assessment   Tramadol  (Ultram ) 50 mg tablet twice daily as needed for pain.   Monitoring: [place] PMP: PDMP reviewed during this encounter.       Pharmacotherapy: No side-effects or adverse reactions reported. Compliance: No problems identified. Effectiveness: Clinically acceptable.  No notes on file  UDS:  Summary  Date Value Ref Range Status  02/02/2024 FINAL  Final    Comment:    ==================================================================== Compliance Drug Analysis, Ur ==================================================================== Test                             Result       Flag       Units  Drug Absent but Declared for Prescription Verification   Pregabalin                      Not Detected UNEXPECTED   Diclofenac                      Not Detected UNEXPECTED    Diclofenac , as indicated in the declared medication list, is not    always detected even when used as directed.    Salicylate                     Not Detected UNEXPECTED    Aspirin, as indicated in the declared medication list, is not always    detected even when used as directed.    Atenolol   Not Detected UNEXPECTED ==================================================================== Test                      Result    Flag   Units      Ref Range   Creatinine              355              mg/dL      >=79 ==================================================================== Declared Medications:  The flagging and interpretation on this report are based on the  following declared medications.  Unexpected results may arise from  inaccuracies in the declared medications.   **Note: The testing scope of this panel includes these medications:   Atenolol  (Tenoretic )  Pregabalin  (Lyrica )   **Note: The testing scope of this panel does not include small to  moderate amounts of these reported medications:   Aspirin  Diclofenac  (Voltaren )   **Note:  The testing scope of this panel does not include the  following reported medications:   Albuterol  (Ventolin  HFA)  Amlodipine  (Norvasc )  Chlorthalidone  (Tenoretic )  Docusate (Colace)  Dolutegravir (Dovato )  Fluticasone  (Flonase )  Formoterol  (Dulera)  Iron   Lamivudine (Dovato )  Levocetirizine (Xyzal )  Meloxicam  (Mobic )  Mometasone  (Dulera)  Omeprazole  (Prilosec)  Salmeterol  Sodium Chloride   Vitamin D3 ==================================================================== For clinical consultation, please call [phone]. ====================================================================     No results found for: CBDTHCR No results found for: D8THCCBX No results found for: D9THCCBX  ROS  Constitutional: Denies any fever or chills Gastrointestinal: No reported hemesis, hematochezia, vomiting, or acute GI distress Musculoskeletal: Denies any acute onset joint swelling, redness, loss of ROM, or weakness Neurological: No reported episodes of acute onset apraxia, aphasia, dysarthria, agnosia, amnesia, paralysis, loss of coordination, or loss of consciousness  Medication Review  Ascorbic Acid, B Complex Vitamins, Cod Liver Oil, Fluticasone -Salmeterol, Magnesium, Multiple Vitamins-Minerals, Vitamin A, Vitamin E, albuterol , amLODipine , aspirin EC, atenolol -chlorthalidone , cholecalciferol, diclofenac  sodium, docusate sodium , dolutegravir-lamiVUDine, ferrous sulfate , fluticasone , levocetirizine, meloxicam , mometasone -formoterol , omeprazole , pregabalin , rosuvastatin, and sodium chloride   History Review  Allergy: Anita Pacheco is allergic to hydrocodone, prednisone, and enalapril . Drug: Anita Pacheco  reports no history of drug use. Alcohol:  reports that she does not currently use alcohol. Tobacco:  reports that she has never smoked. She has never used smokeless tobacco. Social: Anita Pacheco  reports that she has never smoked. She has never used smokeless tobacco. She reports that  she does not currently use alcohol. She reports that she does not use drugs. Medical:  has a past medical history of ANXIETY (07/22/2006), ASTHMA (07/22/2006), Breast mass (04/25/2011), Carpal tunnel syndrome (07/22/2006), HERPES ZOSTER (05/28/2010), HIV (human immunodeficiency virus infection) (HCC), HYPERTENSION (07/22/2006), PERIPHERAL NEUROPATHY (07/03/2008), and RLS (restless legs syndrome). Surgical: Ms. Hannig  has a past surgical history that includes Stapedes surgery (Right); Endovenous ablation saphenous vein w/ laser (Right, 12/22/2017); and Cesarean section. Family: family history includes Asthma in her daughter and daughter; CAD in her father and mother; GER disease in her mother; Hypertension in her mother; Kidney disease in her son; Lymphoma in her maternal aunt; Ovarian cancer in her maternal aunt; Pancreatic cancer in her father; Throat cancer in her paternal grandmother.  Laboratory Chemistry Profile   Renal Lab Results  Component Value Date   BUN 15 08/05/2023   CREATININE 0.78 08/05/2023   LABCREA 208 09/06/2019   BCR SEE NOTE: 08/05/2023   GFRAA 105 02/14/2020   GFRNONAA 91 02/14/2020    Hepatic Lab Results  Component Value Date   AST 15 08/05/2023  ALT 10 08/05/2023   ALBUMIN 3.7 12/03/2016   ALKPHOS 58 12/03/2016   HCVAB NO 08/24/2006    Electrolytes Lab Results  Component Value Date   NA 139 08/05/2023   K 3.5 08/05/2023   CL 104 08/05/2023   CALCIUM 8.9 08/05/2023    Bone No results found for: VD25OH, VD125OH2TOT, CI6874NY7, CI7874NY7, 25OHVITD1, 25OHVITD2, 25OHVITD3, TESTOFREE, TESTOSTERONE  Inflammation (CRP: Acute Phase) (ESR: Chronic Phase) No results found for: CRP, ESRSEDRATE, LATICACIDVEN       Note: Above Lab results reviewed.  Recent Imaging Review  XR Knee 1-2 Views Right X-rays of the right knee show significant tricompartment arthritis. Note: Reviewed        Physical Exam  Vitals: There were no vitals taken  for this visit. BMI: Estimated body mass index is 38.22 kg/m as calculated from the following:   Height as of 05/11/24: 5' 7 (1.702 m).   Weight as of 05/11/24: 244 lb (110.7 kg). Ideal: Patient weight not recorded General appearance: Well nourished, well developed, and well hydrated. In no apparent acute distress Mental status: Alert, oriented x 3 (person, place, & time)       Respiratory: No evidence of acute respiratory distress Eyes: PERLA   Assessment   Diagnosis Status  1. Chronic pain syndrome   2. Postherpetic neuralgia   3. Primary osteoarthritis of right knee   4. Medication management   5. Human immunodeficiency virus (HIV) disease (HCC)    Controlled Controlled Controlled   Updated Problems: No problems updated.   Plan of Care  Problem-specific:  Assessment and Plan            Ms. Brandyn Scripter has a current medication list which includes the following long-term medication(s): albuterol , ferrous sulfate , fluticasone , fluticasone -salmeterol, levocetirizine, omeprazole , pregabalin , rosuvastatin, and sodium chloride .  Pharmacotherapy (Medications Ordered): No orders of the defined types were placed in this encounter.  Orders:  No orders of the defined types were placed in this encounter.    {There is no content from the last Plan section.}   No follow-ups on file.    Recent Visits Date Type Provider Dept  05/11/24 Office Visit Anup Brigham K, NP [facility]  04/13/24 Office Visit Bryston Colocho K, NP [facility]  03/17/24 Office Visit Amyrie Illingworth K, NP [facility]  Showing recent visits within past 90 days and meeting all other requirements Future Appointments Date Type Provider Dept  06/16/24 Appointment Celena Lanius K, NP [facility]  Showing future appointments within next 90 days and meeting all other requirements  I discussed the assessment and treatment plan with the patient. The patient was provided  an opportunity to ask questions and all were answered. The patient agreed with the plan and demonstrated an understanding of the instructions.  Patient advised to call back or seek an in-person evaluation if the symptoms or condition worsens.  Duration of encounter: *** minutes.  Total time on encounter, as per AMA guidelines included both the face-to-face and non-face-to-face time personally spent by the physician and/or other qualified health care professional(s) on the day of the encounter (includes time in activities that require the physician or other qualified health care professional and does not include time in activities normally performed by clinical staff). Physician's time may include the following activities when performed: Preparing to see the patient (e.g., pre-charting review of records, searching for previously ordered imaging, lab work, and nerve conduction tests) Review of prior analgesic pharmacotherapies. Reviewing PMP Interpreting ordered tests (  e.g., lab work, imaging, nerve conduction tests) Performing post-procedure evaluations, including interpretation of diagnostic procedures Obtaining and/or reviewing separately obtained history Performing a medically appropriate examination and/or evaluation Counseling and educating the patient/family/caregiver Ordering medications, tests, or procedures Referring and communicating with other health care professionals (when not separately reported) Documenting clinical information in the electronic or other health record Independently interpreting results (not separately reported) and communicating results to the patient/ family/caregiver Care coordination (not separately reported)  Note by: Kileigh Ortmann K Aurora Rody, NP (TTS and AI technology used. I apologize for any typographical errors that were not detected and corrected.) Date: 06/16/2024; Time: 2:22 PM

## 2024-06-16 ENCOUNTER — Ambulatory Visit: Admitting: Nurse Practitioner

## 2024-06-16 DIAGNOSIS — Z91199 Patient's noncompliance with other medical treatment and regimen due to unspecified reason: Secondary | ICD-10-CM

## 2024-06-16 DIAGNOSIS — B20 Human immunodeficiency virus [HIV] disease: Secondary | ICD-10-CM

## 2024-06-16 DIAGNOSIS — M1711 Unilateral primary osteoarthritis, right knee: Secondary | ICD-10-CM

## 2024-06-16 DIAGNOSIS — B0229 Other postherpetic nervous system involvement: Secondary | ICD-10-CM

## 2024-06-16 DIAGNOSIS — G894 Chronic pain syndrome: Secondary | ICD-10-CM

## 2024-06-16 DIAGNOSIS — Z79899 Other long term (current) drug therapy: Secondary | ICD-10-CM

## 2024-06-20 ENCOUNTER — Encounter: Payer: Self-pay | Admitting: Nurse Practitioner

## 2024-06-20 ENCOUNTER — Ambulatory Visit: Attending: Nurse Practitioner | Admitting: Nurse Practitioner

## 2024-06-20 VITALS — BP 133/96 | HR 86 | Temp 98.6°F | Resp 18 | Ht 67.0 in | Wt 248.0 lb

## 2024-06-20 DIAGNOSIS — M1711 Unilateral primary osteoarthritis, right knee: Secondary | ICD-10-CM | POA: Diagnosis not present

## 2024-06-20 DIAGNOSIS — B20 Human immunodeficiency virus [HIV] disease: Secondary | ICD-10-CM | POA: Diagnosis not present

## 2024-06-20 DIAGNOSIS — B0229 Other postherpetic nervous system involvement: Secondary | ICD-10-CM | POA: Diagnosis not present

## 2024-06-20 DIAGNOSIS — G894 Chronic pain syndrome: Secondary | ICD-10-CM | POA: Diagnosis not present

## 2024-06-20 DIAGNOSIS — Z79899 Other long term (current) drug therapy: Secondary | ICD-10-CM | POA: Diagnosis not present

## 2024-06-20 MED ORDER — TRAMADOL HCL 50 MG PO TABS: 50.0000 mg | ORAL_TABLET | Freq: Three times a day (TID) | ORAL | 1 refills | Status: AC | PRN

## 2024-06-20 NOTE — Progress Notes (Signed)
 PROVIDER NOTE: Interpretation of information contained herein should be left to medically-trained personnel. Specific patient instructions are provided elsewhere under Patient Instructions section of medical record. This document was created in part using AI and STT-dictation technology, any transcriptional errors that may result from this process are unintentional.  Patient: Anita Pacheco  Service: E/M   PCP: Anita Bare, MD  DOB: [birth year]  DOS: 06/20/2024  Provider: Emmy MARLA Blanch, NP  MRN: [id number]  Delivery: Face-to-face  Specialty: Interventional Pain Management  Type: Established Patient  Setting: Ambulatory outpatient facility  Specialty designation: 09  Referring Prov.: Anita Bare, MD  Location: Outpatient office facility       History of present illness (HPI) [name], a 53 y.o. year old female, is here today because of her Chronic pain syndrome [G89.4]. [name] primary complain today is Knee Pain  Pertinent problems: [name] has Human immunodeficiency virus (HIV) disease (HCC); Postherpetic neuralgia; Obesity; Carpal tunnel syndrome; Abnormality of gait; Neck pain; GERD (gastroesophageal reflux disease); Unilateral primary osteoarthritis, right knee; Chronic pain syndrome; Primary osteoarthritis; and Fibromyalgia on their pertinent problem list.  Pain Assessment: Severity of Chronic pain is reported as a 5 /10. Location: Knee Right/Radiates from knee to right hip and down right foot. Onset: More than a month ago. Quality: Aching, Sharp. Timing: Intermittent. Modifying factor(s): Pain medication. Vitals:  height is 5' 7 (1.702 m) and weight is 248 lb (112.5 kg). Her temporal temperature is 98.6 F (37 C). Her blood pressure is 133/96 (abnormal) and her pulse is 86. Her respiration is 18 and oxygen saturation is 98%.  BMI: Estimated body mass index is 38.84 kg/m as calculated from the following:   Height as of this encounter: 5' 7 (1.702 m).    Weight as of this encounter: 248 lb (112.5 kg).  Last encounter: 05/11/2024. Last procedure: Visit date not found.  Reason for encounter: medication management. No change in medical history since last visit.  Patient's pain is at baseline.  Patient continues multimodal pain regimen as prescribed.  States that it provides pain relief and improvement in functional status.   Discussed the use of AI scribe software for clinical note transcription with the patient, who gave verbal consent to proceed.  History of Present Illness   Anita Pacheco is a 53 year old female who presents with worsening right-sided pain and knee issues.  She experiences shooting pain down her right side, which has intensified with the recent cold weather. She manages the pain with tramadol , typically taking one and a half pills daily, but has increased to two and a half pills due to increased pain. She usually takes the medication upon waking and before bed, but has needed additional doses in the morning due to early morning pain.  She has a history of knee problems, including falls that have exacerbated her condition. She reports that her orthopedic doctor told her there is no cartilage remaining in her knee. The pain in her knee is severe, affecting her ability to stand for more than an hour or two, and causing discomfort when sitting or lying down. The pain is described as pulsating and persistent, regardless of her activity.  She is currently taking tramadol  50 mg, typically twice a day, but has been taking it more frequently due to increased pain. She is concerned about the availability of her medication from the pharmacy.     Pharmacotherapy Assessment   Tramadol  (Ultram ) 50 mg tablet every 8 hours as needed for pain. MME=20  Monitoring: [place] PMP: PDMP reviewed during this encounter.       Pharmacotherapy: No side-effects or adverse reactions reported. Compliance: No problems identified. Effectiveness: Clinically  acceptable.  Anita Pacheco, [place]  06/20/2024  1:18 PM  Sign when Signing Visit Nursing Pain Medication Assessment:  Safety precautions to be maintained throughout the outpatient stay will include: orient to surroundings, keep bed in low position, maintain call bell within reach at all times, provide assistance with transfer out of bed and ambulation.  Medication Inspection Compliance: Pill count conducted under aseptic conditions, in front of the patient. Neither the pills nor the bottle was removed from the patient's sight at any time. Once count was completed pills were immediately returned to the patient in their original bottle.  Medication: Tramadol  (Ultram ) Pill/Patch Count: 10 of 50 pills/patches remain Pill/Patch Appearance: Markings consistent with prescribed medication Bottle Appearance: Standard pharmacy container. Clearly labeled. Filled Date: 72 / 24 / 2025 Last Medication intake:  Today  UDS:  Summary  Date Value Ref Range Status  02/02/2024 FINAL  Final    Comment:    ==================================================================== Compliance Drug Analysis, Ur ==================================================================== Test                             Result       Flag       Units  Drug Absent but Declared for Prescription Verification   Pregabalin                      Not Detected UNEXPECTED   Diclofenac                      Not Detected UNEXPECTED    Diclofenac , as indicated in the declared medication list, is not    always detected even when used as directed.    Salicylate                     Not Detected UNEXPECTED    Aspirin, as indicated in the declared medication list, is not always    detected even when used as directed.    Atenolol                        Not Detected UNEXPECTED ==================================================================== Test                      Result    Flag   Units      Ref Range   Creatinine              355               mg/dL      >=79 ==================================================================== Declared Medications:  The flagging and interpretation on this report are based on the  following declared medications.  Unexpected results may arise from  inaccuracies in the declared medications.   **Note: The testing scope of this panel includes these medications:   Atenolol  (Tenoretic )  Pregabalin  (Lyrica )   **Note: The testing scope of this panel does not include small to  moderate amounts of these reported medications:   Aspirin  Diclofenac  (Voltaren )   **Note: The testing scope of this panel does not include the  following reported medications:   Albuterol  (Ventolin  HFA)  Amlodipine  (Norvasc )  Chlorthalidone  (Tenoretic )  Docusate (Colace)  Dolutegravir (Dovato )  Fluticasone  (Flonase )  Formoterol  (Dulera)  Iron   Lamivudine (Dovato )  Levocetirizine (Xyzal )  Meloxicam  (Mobic )  Mometasone  (Dulera)  Omeprazole  (Prilosec)  Salmeterol  Sodium Chloride   Vitamin D3 ==================================================================== For clinical consultation, please call [phone]. ====================================================================     No results found for: CBDTHCR No results found for: D8THCCBX No results found for: D9THCCBX  ROS  Constitutional: Denies any fever or chills Gastrointestinal: No reported hemesis, hematochezia, vomiting, or acute GI distress Musculoskeletal: right knee pain Neurological: No reported episodes of acute onset apraxia, aphasia, dysarthria, agnosia, amnesia, paralysis, loss of coordination, or loss of consciousness  Medication Review  Ascorbic Acid, B Complex Vitamins, Cod Liver Oil, Fluticasone -Salmeterol, Magnesium, Multiple Vitamins-Minerals, Vitamin A, Vitamin E, albuterol , amLODipine , aspirin EC, atenolol -chlorthalidone , cholecalciferol, diclofenac  sodium, docusate sodium , dolutegravir-lamiVUDine, ferrous sulfate ,  fluticasone , levocetirizine, meloxicam , mometasone -formoterol , omeprazole , pregabalin , rosuvastatin, sodium chloride , and traMADol   History Review  Allergy: [name] is allergic to hydrocodone, prednisone, and enalapril . Drug: [name]  reports no history of drug use. Alcohol:  reports that she does not currently use alcohol. Tobacco:  reports that she has never smoked. She has never used smokeless tobacco. Social: [name]  reports that she has never smoked. She has never used smokeless tobacco. She reports that she does not currently use alcohol. She reports that she does not use drugs. Medical:  has a past medical history of ANXIETY (07/22/2006), ASTHMA (07/22/2006), Breast mass (04/25/2011), Carpal tunnel syndrome (07/22/2006), HERPES ZOSTER (05/28/2010), HIV (human immunodeficiency virus infection) (HCC), HYPERTENSION (07/22/2006), PERIPHERAL NEUROPATHY (07/03/2008), and RLS (restless legs syndrome). Surgical: [name]  has a past surgical history that includes Stapedes surgery (Right); Endovenous ablation saphenous vein w/ laser (Right, 12/22/2017); and Cesarean section. Family: family history includes Asthma in her daughter and daughter; CAD in her father and mother; GER disease in her mother; Hypertension in her mother; Kidney disease in her son; Lymphoma in her maternal aunt; Ovarian cancer in her maternal aunt; Pancreatic cancer in her father; Throat cancer in her paternal grandmother.  Laboratory Chemistry Profile   Renal Lab Results  Component Value Date   BUN 15 08/05/2023   CREATININE 0.78 08/05/2023   LABCREA 208 09/06/2019   BCR SEE NOTE: 08/05/2023   GFRAA 105 02/14/2020   GFRNONAA 91 02/14/2020    Hepatic Lab Results  Component Value Date   AST 15 08/05/2023   ALT 10 08/05/2023   ALBUMIN 3.7 12/03/2016   ALKPHOS 58 12/03/2016   HCVAB NO 08/24/2006    Electrolytes Lab Results  Component Value Date   NA 139 08/05/2023   K 3.5 08/05/2023   CL 104  08/05/2023   CALCIUM 8.9 08/05/2023    Bone No results found for: VD25OH, VD125OH2TOT, CI6874NY7, CI7874NY7, 25OHVITD1, 25OHVITD2, 25OHVITD3, TESTOFREE, TESTOSTERONE  Inflammation (CRP: Acute Phase) (ESR: Chronic Phase) No results found for: CRP, ESRSEDRATE, LATICACIDVEN       Note: Above Lab results reviewed.  Recent Imaging Review  XR Knee 1-2 Views Right X-rays of the right knee show significant tricompartment arthritis. Note: Reviewed        Physical Exam  Vitals: BP (!) 133/96 (BP Location: Left Arm, Patient Position: Sitting, Cuff Size: Normal)   Pulse 86   Temp 98.6 F (37 C) (Temporal)   Resp 18   Ht 5' 7 (1.702 m)   Wt 248 lb (112.5 kg)   SpO2 98%   BMI 38.84 kg/m  BMI: Estimated body mass index is 38.84 kg/m as calculated from the following:   Height as of this encounter: 5' 7 (1.702 m).   Weight as of this encounter:  248 lb (112.5 kg). Ideal: Ideal body weight: 61.6 kg (135 lb 12.9 oz) Adjusted ideal body weight: 82 kg (180 lb 10.9 oz) General appearance: Well nourished, well developed, and well hydrated. In no apparent acute distress Mental status: Alert, oriented x 3 (person, place, & time)       Respiratory: No evidence of acute respiratory distress Eyes: PERLA  Musculoskeletal: Right knee pain worse with weightbearing, walking and sitting Assessment   Diagnosis Status  1. Chronic pain syndrome   2. Primary osteoarthritis of right knee   3. Postherpetic neuralgia   4. Medication management   5. Human immunodeficiency virus (HIV) disease (HCC)    Controlled Controlled Controlled   Updated Problems: Problem  Medication Management    Plan of Care  Problem-specific:  Assessment and Plan    Chronic pain syndrome Increased severity, particularly in the right knee, exacerbated by recent falls and cold weather. Pain is pulsating and severe, affecting daily activities. - Increased tramadol  dosage to 50 mg three times a  day. - Provided a 60-days prescription with one refill. - Instructed to inform orthopedist about current pain management plan.  Primary osteoarthritis of right knee Significant cartilage loss leading to increased pain and functional limitations. Recent falls exacerbated the condition. Knee replacement surgery is being considered. - Continue follow-up with orthopedist for potential knee replacement surgery. - Ensure orthopedist is aware of current pain management plan.  Medication management for chronic pain Current medication regimen includes tramadol , with recent increase in dosage due to increased pain severity. Concerns about medication supply and insurance approval were discussed. - Sent tramadol  prescription to [facility] with one refill. - Ensured pharmacy is aware of current prescription details.  Patient's pain is controlled with tramadol , will continue on current medication regimen.  Prescribing drug monitoring (PDMP) reviewed, findings consistent with the use of prescribed medication and no evidence of narcotic misuse or abuse.  Urine drug screening (UDS) up to date.  No side effects or adverse reaction reported to medication.  The patient was advised to take medication as prescribed.  Schedule follow-up in 60 days for medication management.      [name] has a current medication list which includes the following long-term medication(s): albuterol , ferrous sulfate , fluticasone , fluticasone -salmeterol, levocetirizine, omeprazole , pregabalin , rosuvastatin, and sodium chloride .  Pharmacotherapy (Medications Ordered): Meds ordered this encounter  Medications   traMADol  (ULTRAM ) 50 MG tablet    Sig: Take 1 tablet (50 mg total) by mouth every 8 (eight) hours as needed.    Dispense:  90 tablet    Refill:  1   Orders:  No orders of the defined types were placed in this encounter.       Return in about 2 months (around 08/21/2024) for (F2F), (MM), Emmy Blanch NP.    Recent  Visits Date Type Provider Dept  05/11/24 Office Visit Breeanne Oblinger K, NP [facility]  04/13/24 Office Visit Lisbeth Puller K, NP [facility]  Showing recent visits within past 90 days and meeting all other requirements Today's Visits Date Type Provider Dept  06/20/24 Office Visit Parks Czajkowski K, NP [facility]  Showing today's visits and meeting all other requirements Future Appointments No visits were found meeting these conditions. Showing future appointments within next 90 days and meeting all other requirements  I discussed the assessment and treatment plan with the patient. The patient was provided an opportunity to ask questions and all were answered. The patient agreed with the plan and demonstrated an  understanding of the instructions.  Patient advised to call back or seek an in-person evaluation if the symptoms or condition worsens.  I personally spent a total of 30 minutes in the care of the patient today including preparing to see the patient, getting/reviewing separately obtained history, performing a medically appropriate exam/evaluation, counseling and educating, placing orders, referring and communicating with other health care professionals, documenting clinical information in the EHR, independently interpreting results, communicating results, and coordinating care.   Note by: Grover Robinson K Annella Prowell, NP (TTS and AI technology used. I apologize for any typographical errors that were not detected and corrected.) Date: 06/20/2024; Time: 1:39 PM

## 2024-06-20 NOTE — Progress Notes (Signed)
 Nursing Pain Medication Assessment:  Safety precautions to be maintained throughout the outpatient stay will include: orient to surroundings, keep bed in low position, maintain call bell within reach at all times, provide assistance with transfer out of bed and ambulation.  Medication Inspection Compliance: Pill count conducted under aseptic conditions, in front of the patient. Neither the pills nor the bottle was removed from the patient's sight at any time. Once count was completed pills were immediately returned to the patient in their original bottle.  Medication: Tramadol  (Ultram ) Pill/Patch Count: 10 of 50 pills/patches remain Pill/Patch Appearance: Markings consistent with prescribed medication Bottle Appearance: Standard pharmacy container. Clearly labeled. Filled Date: 23 / 24 / 2025 Last Medication intake:  Today

## 2024-07-04 ENCOUNTER — Ambulatory Visit: Admitting: Orthopaedic Surgery

## 2024-07-27 ENCOUNTER — Encounter: Payer: Self-pay | Admitting: Orthopaedic Surgery

## 2024-07-27 ENCOUNTER — Ambulatory Visit: Admitting: Orthopaedic Surgery

## 2024-07-27 VITALS — Ht 66.0 in | Wt 253.8 lb

## 2024-07-27 DIAGNOSIS — M1711 Unilateral primary osteoarthritis, right knee: Secondary | ICD-10-CM | POA: Diagnosis not present

## 2024-07-27 DIAGNOSIS — G8929 Other chronic pain: Secondary | ICD-10-CM | POA: Diagnosis not present

## 2024-07-27 DIAGNOSIS — M25561 Pain in right knee: Secondary | ICD-10-CM

## 2024-07-27 NOTE — Progress Notes (Signed)
 The patient comes in today for a weight check and a BMI calculation.  She is in need of a right knee replacement.  She understands my concern is the large soft tissue envelope she has around her right knee.  She understands this makes for much more difficult surgery.  She is having debilitating right knee pain.  She already walks with a cane and has a knee brace for her right knee.  She is a regular patient of [facility] as well.  Her BMI today is 40.96.  She still has a large soft tissue envelope around her right knee and she understands surgery from our perspective is good to be quite difficult but not insurmountable.  Her most recent knee x-rays show bone-on-bone wear of the right knee with valgus malalignment.  We will see her back in just 6 weeks with a repeat weight and BMI calculation.  She understands that she needs to be below 40 BMI in order to be scheduled for surgery.

## 2024-08-17 ENCOUNTER — Encounter: Admitting: Nurse Practitioner

## 2024-09-07 ENCOUNTER — Ambulatory Visit: Admitting: Orthopaedic Surgery

## 2025-02-13 ENCOUNTER — Ambulatory Visit: Admitting: Infectious Diseases
